# Patient Record
Sex: Female | Born: 1960 | Race: Black or African American | Hispanic: No | Marital: Single | Smoking: Never smoker
Health system: Southern US, Community
[De-identification: ages and names within clinical notes are randomized; demographics above are authoritative.]

## PROBLEM LIST (undated history)

## (undated) DIAGNOSIS — L0390 Cellulitis, unspecified: Secondary | ICD-10-CM

## (undated) DIAGNOSIS — M797 Fibromyalgia: Secondary | ICD-10-CM

## (undated) DIAGNOSIS — E119 Type 2 diabetes mellitus without complications: Secondary | ICD-10-CM

## (undated) DIAGNOSIS — Z86718 Personal history of other venous thrombosis and embolism: Secondary | ICD-10-CM

## (undated) DIAGNOSIS — M069 Rheumatoid arthritis, unspecified: Secondary | ICD-10-CM

## (undated) DIAGNOSIS — I5030 Unspecified diastolic (congestive) heart failure: Secondary | ICD-10-CM

## (undated) DIAGNOSIS — I10 Essential (primary) hypertension: Secondary | ICD-10-CM

---

## 1986-06-17 HISTORY — PX: ORIF ANKLE FRACTURE: SUR919

## 2003-01-20 ENCOUNTER — Emergency Department (HOSPITAL_COMMUNITY): Admission: EM | Admit: 2003-01-20 | Discharge: 2003-01-20 | Payer: Self-pay | Admitting: Emergency Medicine

## 2007-02-12 ENCOUNTER — Encounter: Admission: RE | Admit: 2007-02-12 | Discharge: 2007-02-12 | Payer: Self-pay | Admitting: Rheumatology

## 2007-09-23 ENCOUNTER — Emergency Department (HOSPITAL_COMMUNITY): Admission: EM | Admit: 2007-09-23 | Discharge: 2007-09-23 | Payer: Self-pay | Admitting: Emergency Medicine

## 2007-09-25 ENCOUNTER — Emergency Department (HOSPITAL_COMMUNITY): Admission: EM | Admit: 2007-09-25 | Discharge: 2007-09-25 | Payer: Self-pay | Admitting: Emergency Medicine

## 2007-09-28 ENCOUNTER — Emergency Department (HOSPITAL_COMMUNITY): Admission: EM | Admit: 2007-09-28 | Discharge: 2007-09-28 | Payer: Self-pay | Admitting: Emergency Medicine

## 2007-11-18 ENCOUNTER — Ambulatory Visit: Payer: Self-pay | Admitting: Vascular Surgery

## 2007-12-02 ENCOUNTER — Ambulatory Visit: Payer: Self-pay | Admitting: Vascular Surgery

## 2008-03-04 ENCOUNTER — Ambulatory Visit: Payer: Self-pay | Admitting: Vascular Surgery

## 2009-02-28 ENCOUNTER — Emergency Department (HOSPITAL_BASED_OUTPATIENT_CLINIC_OR_DEPARTMENT_OTHER): Admission: EM | Admit: 2009-02-28 | Discharge: 2009-02-28 | Payer: Self-pay | Admitting: Emergency Medicine

## 2009-02-28 ENCOUNTER — Ambulatory Visit: Payer: Self-pay | Admitting: Diagnostic Radiology

## 2009-07-04 ENCOUNTER — Observation Stay (HOSPITAL_COMMUNITY): Admission: EM | Admit: 2009-07-04 | Discharge: 2009-07-10 | Payer: Self-pay | Admitting: Emergency Medicine

## 2009-07-05 ENCOUNTER — Encounter (INDEPENDENT_AMBULATORY_CARE_PROVIDER_SITE_OTHER): Payer: Self-pay | Admitting: Emergency Medicine

## 2009-07-05 ENCOUNTER — Ambulatory Visit: Payer: Self-pay | Admitting: Vascular Surgery

## 2009-07-05 ENCOUNTER — Encounter (INDEPENDENT_AMBULATORY_CARE_PROVIDER_SITE_OTHER): Payer: Self-pay | Admitting: Internal Medicine

## 2010-07-08 ENCOUNTER — Encounter: Payer: Self-pay | Admitting: Orthopedic Surgery

## 2010-09-03 LAB — GLUCOSE, CAPILLARY
Glucose-Capillary: 107 mg/dL — ABNORMAL HIGH (ref 70–99)
Glucose-Capillary: 115 mg/dL — ABNORMAL HIGH (ref 70–99)
Glucose-Capillary: 135 mg/dL — ABNORMAL HIGH (ref 70–99)
Glucose-Capillary: 136 mg/dL — ABNORMAL HIGH (ref 70–99)
Glucose-Capillary: 139 mg/dL — ABNORMAL HIGH (ref 70–99)
Glucose-Capillary: 146 mg/dL — ABNORMAL HIGH (ref 70–99)
Glucose-Capillary: 154 mg/dL — ABNORMAL HIGH (ref 70–99)
Glucose-Capillary: 154 mg/dL — ABNORMAL HIGH (ref 70–99)
Glucose-Capillary: 156 mg/dL — ABNORMAL HIGH (ref 70–99)
Glucose-Capillary: 162 mg/dL — ABNORMAL HIGH (ref 70–99)
Glucose-Capillary: 177 mg/dL — ABNORMAL HIGH (ref 70–99)
Glucose-Capillary: 87 mg/dL (ref 70–99)

## 2010-09-03 LAB — CBC
HCT: 34.1 % — ABNORMAL LOW (ref 36.0–46.0)
HCT: 41 % (ref 36.0–46.0)
Hemoglobin: 11.4 g/dL — ABNORMAL LOW (ref 12.0–15.0)
Hemoglobin: 13.3 g/dL (ref 12.0–15.0)
MCHC: 32.4 g/dL (ref 30.0–36.0)
MCHC: 33.7 g/dL (ref 30.0–36.0)
MCHC: 34 g/dL (ref 30.0–36.0)
MCHC: 34.2 g/dL (ref 30.0–36.0)
MCV: 97.3 fL (ref 78.0–100.0)
MCV: 97.3 fL (ref 78.0–100.0)
MCV: 97.3 fL (ref 78.0–100.0)
Platelets: 215 10*3/uL (ref 150–400)
RBC: 3.4 MIL/uL — ABNORMAL LOW (ref 3.87–5.11)
RBC: 3.48 MIL/uL — ABNORMAL LOW (ref 3.87–5.11)
RBC: 3.51 MIL/uL — ABNORMAL LOW (ref 3.87–5.11)
RDW: 17.9 % — ABNORMAL HIGH (ref 11.5–15.5)
RDW: 18.2 % — ABNORMAL HIGH (ref 11.5–15.5)
RDW: 18.3 % — ABNORMAL HIGH (ref 11.5–15.5)
WBC: 5.3 10*3/uL (ref 4.0–10.5)
WBC: 7 10*3/uL (ref 4.0–10.5)

## 2010-09-03 LAB — BASIC METABOLIC PANEL
BUN: 8 mg/dL (ref 6–23)
CO2: 27 mEq/L (ref 19–32)
CO2: 27 mEq/L (ref 19–32)
CO2: 28 mEq/L (ref 19–32)
CO2: 28 mEq/L (ref 19–32)
Calcium: 8 mg/dL — ABNORMAL LOW (ref 8.4–10.5)
Calcium: 8.1 mg/dL — ABNORMAL LOW (ref 8.4–10.5)
Calcium: 8.1 mg/dL — ABNORMAL LOW (ref 8.4–10.5)
Chloride: 103 mEq/L (ref 96–112)
Chloride: 104 mEq/L (ref 96–112)
Chloride: 98 mEq/L (ref 96–112)
Creatinine, Ser: 0.5 mg/dL (ref 0.4–1.2)
Creatinine, Ser: 0.63 mg/dL (ref 0.4–1.2)
Creatinine, Ser: 0.7 mg/dL (ref 0.4–1.2)
GFR calc Af Amer: >60 mL/min (ref >60)
GFR calc Af Amer: >60 mL/min (ref >60)
GFR calc Af Amer: >60 mL/min (ref >60)
GFR calc Af Amer: >60 mL/min (ref >60)
GFR calc non Af Amer: >60 mL/min (ref >60)
Glucose, Bld: 117 mg/dL — ABNORMAL HIGH (ref 70–99)
Glucose, Bld: 144 mg/dL — ABNORMAL HIGH (ref 70–99)
Glucose, Bld: 179 mg/dL — ABNORMAL HIGH (ref 70–99)
Potassium: 3.2 mEq/L — ABNORMAL LOW (ref 3.5–5.1)
Potassium: 3.3 mEq/L — ABNORMAL LOW (ref 3.5–5.1)
Sodium: 137 mEq/L (ref 135–145)
Sodium: 140 mEq/L (ref 135–145)
Sodium: 140 mEq/L (ref 135–145)

## 2010-09-03 LAB — PROTIME-INR
INR: 1.93 — ABNORMAL HIGH (ref 0.00–1.49)
INR: 2.61 — ABNORMAL HIGH (ref 0.00–1.49)
Prothrombin Time: 21.9 seconds — ABNORMAL HIGH (ref 11.6–15.2)
Prothrombin Time: 26.9 seconds — ABNORMAL HIGH (ref 11.6–15.2)
Prothrombin Time: 27.7 seconds — ABNORMAL HIGH (ref 11.6–15.2)

## 2010-09-03 LAB — BETA-2-GLYCOPROTEIN I ABS, IGG/M/A
Beta-2-Glycoprotein I IgA: <3 U/mL (ref <=15)
Beta-2-Glycoprotein I IgM: <3 U/mL (ref <=15)

## 2010-09-03 LAB — CULTURE, BLOOD (ROUTINE X 2): Culture: NO GROWTH

## 2010-09-03 LAB — LIPID PANEL
Cholesterol: 149 mg/dL (ref 0–200)
LDL Cholesterol: 88 mg/dL (ref 0–99)
Total CHOL/HDL Ratio: 3 RATIO
Triglycerides: 57 mg/dL (ref <150)

## 2010-09-03 LAB — LUPUS ANTICOAGULANT PANEL
Lupus Anticoagulant: DETECTED — AB
PTT Lupus Anticoagulant: 50.1 secs — ABNORMAL HIGH (ref 32.0–43.4)

## 2010-09-03 LAB — PROTEIN C ACTIVITY: Protein C Activity: 125 % (ref 75–133)

## 2010-09-03 LAB — DIFFERENTIAL
Basophils Relative: 1 % (ref 0–1)
Eosinophils Relative: 0 % (ref 0–5)
Monocytes Absolute: 0.5 10*3/uL (ref 0.1–1.0)
Monocytes Relative: 5 % (ref 3–12)
Neutro Abs: 7.2 10*3/uL (ref 1.7–7.7)

## 2010-09-03 LAB — PROTEIN C, TOTAL: Protein C, Total: 123 % (ref 70–140)

## 2010-09-03 LAB — CARDIOLIPIN ANTIBODIES, IGG, IGM, IGA: Anticardiolipin IgM: <3 MPL U/mL — ABNORMAL LOW (ref <10)

## 2010-09-03 LAB — PROTEIN S ACTIVITY: Protein S Activity: 92 % (ref 69–129)

## 2010-09-03 LAB — HEMOGLOBIN A1C
Hgb A1c MFr Bld: 8.9 % — ABNORMAL HIGH (ref 4.6–6.1)
Mean Plasma Glucose: 209 mg/dL

## 2010-09-03 LAB — HOMOCYSTEINE: Homocysteine: 16.5 umol/L — ABNORMAL HIGH (ref 4.0–15.4)

## 2010-10-30 NOTE — Assessment & Plan Note (Signed)
OFFICE VISIT   Brittany Tran, Brittany Tran  DOB:  12-Jul-1960                                       03/04/2008  [id number]#:[id number]   Patient presents today for followup of her venous difficulty.  She was  initially seen by me on 11/18/07.  Thus, she underwent a formal duplex  evaluation on 06/17.  She did have evidence of venous reflux and had an  incidental finding of deep venous thrombosis in her left common femoral  vein and left popliteal vein.  She has been on three months of Coumadin  therapy.   She underwent repeat venous duplex today, and this portion shows  resolution of the left common femoral vein thrombus.  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   I discussed this with the patient.  Feel comfortable stopping her  Coumadin therapy since she has been on this for over three months now.  Regarding the leg swelling, I explained that she does have evidence of  deep venous difficulty but no evidence of correctable superficial venous  pathology.  I explained that her only mode of treatment will be  elevation and compression.  She has not been able to tolerate  compression garments due to her obesity.  She will attempt to elevate  her legs when possible.  She will see [place] again on an as-needed basis.   Larina Earthly, M.D.  Electronically Signed   TFE/MEDQ  D:  03/04/2008  T:  03/07/2008  Job:  [id number]   cc:   Gretta Arab. Valentina Lucks, M.D.

## 2010-10-30 NOTE — Procedures (Signed)
DUPLEX DEEP VENOUS EXAM - LOWER EXTREMITY   INDICATION:  Follow-up evaluation of left leg DVT.   HISTORY:  Edema:  Bilateral.  Trauma/Surgery:  No.  Pain:  No.  PE:  Previous DVT:  Left common femoral vein and left popliteal vein thrombus  seen by duplex performed 12/02/07.  Anticoagulants:  Patient is on Coumadin.  Other:   DUPLEX EXAM:                CFV   SFV   PopV  PTV    GSV                R  L  R  L  R  L  R   L  R  L  Thrombosis    o  o     o     P      o     o  Spontaneous   +  +     +     +      +     +  Phasic        +  +     +     +      +     +  Augmentation  +  +     +     +      +     +  Compressible  +  +     +     P      +     +  Competent     +  +     +     +      +     +   Legend:  + - yes  o - no  p - partial  D - decreased   IMPRESSION:  1. Previously documented left common femoral vein thrombus appears to      have resolved.  2. 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  3. Left greater saphenous vein is competent.  4. 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    _____________________________  Larina Earthly, M.D.   MC/MEDQ  D:  03/04/2008  T:  03/05/2008  Job:  [id number]

## 2010-10-30 NOTE — Procedures (Signed)
DUPLEX DEEP VENOUS EXAM - LOWER EXTREMITY   INDICATION:  Two week followup for venous reflux.   HISTORY:  Edema:  Bilateral.  Trauma/Surgery:  No.  Pain:  No.  PE:  No.  Previous DVT:  No.  Anticoagulants:  No.  Other:   DUPLEX EXAM:                CFV   SFV   PopV   PTV   GSV                R  L  R  L  R   L  R  L  R  L  Thrombosis    o  +  O  o  o   +        o  o  Spontaneous   +  D  +  D  +   D        +  D  Phasic        +  D  +  D  +   D        +  D  Augmentation  +  D  +  D  +   D        +  +  Compressible  +  P  +  +  +   P        +  +  Competent     0  +                     +  +   Legend:  + - yes  o - no  p - partial  D - decreased   IMPRESSION:  1. Partially occlusive mobile thrombus noted in the left common      femoral vein with partially occlusive thrombus also noted in the      left popliteal vein.  2. No evidence of deep venous thrombosis noted in the right lower      extremity with mild reflux noted at the common femoral vein level.  3. No evidence of reflux noted in the bilateral greater saphenous      veins with areas of varicosities noted at the bilateral calf and      distal thigh levels.  4. Unable to adequately visualize the bilateral tibial and lesser      saphenous veins.  5. Limited visualization of the bilateral lower extremities due to      patient body habitus and leg edema.    _____________________________  Larina Earthly, M.D.   CH/MEDQ  D:  12/02/2007  T:  12/02/2007  Job:  [id number]

## 2010-10-30 NOTE — Letter (Signed)
November 18, 2007   Gretta Arab. Valentina Lucks, M.D.  [address] [place]  [place] [address]   Re:  Brittany Tran, Brittany Tran                 DOB:  February 25, 1961   Dear Consuella Lose:   Thank you for asking me to see this patient for evaluation of her severe  bilateral venous hypertension.  As you know, she has had a long history  of venous hypertension and increasing difficulty with lower extremity  swelling and pain.  She has also had two different occasions of  significant bleed from the varicosities around the level of her right  ankle.  One of these was treated with a suture in the emergency  department.  She has been wearing the graduated compression stockings  that you prescribed with Jobst stockings in mid April.  She does report  that she has had some slight improvement with this but does continue to  have achy sensations with prolonged standing.   PAST MEDICAL HISTORY:  Significant for hypertension, rheumatoid  arthritis.  She has no major cardiovascular disease.   PHYSICAL EXAMINATION:  She is a morbidly obese black female appearing  her stated age of 28.  Her blood pressure is 151/100, heart rate is 93,  respirations 18.  She does have 2+ dorsalis pedis pulses bilaterally.  She does have a prior medial and lateral incisions on her ankle from  prior ankle surgery from a fracture.  She does have multiple small  punctate areas of venous varicosities just under the skin over her right  ankle with appearance of high risk for bleeding.  She does have erythema  and changes of venous hypertension in both calves.   She underwent a screening duplex by me and this did show reflux in her  saphenous veins bilaterally.  I explained that the patient is undergoing  a current appropriate initial treatment for this with graduated  compression stockings, elevation and ibuprofen for pain management.  I  plan to see her again at her convenience in the next 1-2 weeks for  evaluation with formal duplex to  determine competency of her deep  system.  She understands that she may be a candidate for laser ablation  to reduce her risk for ongoing bleeding from her right ankle.  We will  give you for further followup once we have seen her back with formal  duplex evaluation.   Larina Earthly, M.D.  Electronically Signed   TFE/MEDQ  D:  11/18/2007  T:  11/19/2007  Job:  [id number]

## 2010-10-30 NOTE — Assessment & Plan Note (Signed)
OFFICE VISIT   Brittany Tran, Brittany Tran  DOB:  01-14-1961                                       12/02/2007  [id number]#:[id number]   The patient presents today for formal venous duplex after initial visit  with her 2 weeks ago.  She is a very pleasant, morbidly obese black  female with severe venous hypertension bilaterally.  She has marked  amount of varicosities under her subcutaneous tissue on both legs and  with screening study did appear to have some reflux.  With a formal  duplex study today she does not have any evidence of saphenous vein  reflux.  Does have some reflux in the tributary varicosities around  these.  She did have an incidental finding of deep venous thrombosis at  the level of her left common femoral vein with some mobile thrombus and  also a lesser degree of thrombus in her popliteal vein.  I discussed  this with Dr. Maurice Small.  I feel that she does have indication for  Coumadin therapy.  I suspect this was at least subacute or chronic but  with some mobile thrombus present I would recommend 3-6 months of  Coumadin.  I discussed this at length with the patient as well.  I do  not feel that she has any indication for Lovenox bridge since she is  asymptomatic from this.  She is started on 10 mg of Coumadin daily and  will have a Coumadin check at Dr. Jone Baseman office on Friday, June 19.  I will see her again in 3 months with repeat duplex at that time.   Larina Earthly, M.D.  Electronically Signed   TFE/MEDQ  D:  12/02/2007  T:  12/03/2007  Job:  [id number]   cc:   Gretta Arab. Valentina Lucks, M.D.

## 2014-06-30 ENCOUNTER — Other Ambulatory Visit: Payer: Self-pay | Admitting: Rheumatology

## 2014-06-30 DIAGNOSIS — M25511 Pain in right shoulder: Secondary | ICD-10-CM

## 2014-07-10 ENCOUNTER — Other Ambulatory Visit: Payer: Self-pay

## 2014-07-20 ENCOUNTER — Ambulatory Visit
Admission: RE | Admit: 2014-07-20 | Discharge: 2014-07-20 | Disposition: A | Discharge status: Home / Self Care | Payer: 59 | Source: Ambulatory Visit | Attending: Rheumatology | Admitting: Rheumatology

## 2014-07-20 DIAGNOSIS — M25511 Pain in right shoulder: Secondary | ICD-10-CM

## 2014-07-31 ENCOUNTER — Ambulatory Visit: Admission: RE | Admit: 2014-07-31 | Payer: 59 | Source: Ambulatory Visit

## 2014-09-22 ENCOUNTER — Encounter (HOSPITAL_BASED_OUTPATIENT_CLINIC_OR_DEPARTMENT_OTHER): Payer: Self-pay | Admitting: *Deleted

## 2014-09-22 NOTE — Progress Notes (Signed)
   09/22/14 1225  OBSTRUCTIVE SLEEP APNEA  Have you ever been diagnosed with sleep apnea through a sleep study? No  Do you snore loudly (loud enough to be heard through closed doors)?  0  Do you often feel tired, fatigued, or sleepy during the daytime? 0  Has anyone observed you stop breathing during your sleep? 0  Do you have, or are you being treated for high blood pressure? 1  BMI more than 35 kg/m2? 1  Age over 55 years old? 1  Neck circumference greater than 40 cm/16 inches? 1  Gender: 0

## 2014-09-22 NOTE — Progress Notes (Signed)
To come in 09/28/14 for pt bmet-ekg-see anesthesia-bmi 48 Denies any snoring or sleeep apnea, no cardiac,resp problems

## 2014-09-28 ENCOUNTER — Encounter (HOSPITAL_BASED_OUTPATIENT_CLINIC_OR_DEPARTMENT_OTHER)
Admission: RE | Admit: 2014-09-28 | Discharge: 2014-09-28 | Disposition: A | Discharge status: Home / Self Care | Payer: 59 | Source: Ambulatory Visit | Attending: Orthopedic Surgery | Admitting: Orthopedic Surgery

## 2014-09-28 DIAGNOSIS — I10 Essential (primary) hypertension: Secondary | ICD-10-CM | POA: Diagnosis not present

## 2014-09-28 DIAGNOSIS — Z86718 Personal history of other venous thrombosis and embolism: Secondary | ICD-10-CM | POA: Diagnosis not present

## 2014-09-28 DIAGNOSIS — M25572 Pain in left ankle and joints of left foot: Secondary | ICD-10-CM | POA: Diagnosis not present

## 2014-09-28 DIAGNOSIS — M069 Rheumatoid arthritis, unspecified: Secondary | ICD-10-CM | POA: Diagnosis not present

## 2014-09-28 DIAGNOSIS — Y838 Other surgical procedures as the cause of abnormal reaction of the patient, or of later complication, without mention of misadventure at the time of the procedure: Secondary | ICD-10-CM | POA: Diagnosis not present

## 2014-09-28 DIAGNOSIS — Z7901 Long term (current) use of anticoagulants: Secondary | ICD-10-CM | POA: Diagnosis not present

## 2014-09-28 DIAGNOSIS — E119 Type 2 diabetes mellitus without complications: Secondary | ICD-10-CM | POA: Diagnosis not present

## 2014-09-28 DIAGNOSIS — Z880 Allergy status to penicillin: Secondary | ICD-10-CM | POA: Diagnosis not present

## 2014-09-28 DIAGNOSIS — T8484XA Pain due to internal orthopedic prosthetic devices, implants and grafts, initial encounter: Secondary | ICD-10-CM | POA: Diagnosis present

## 2014-09-28 DIAGNOSIS — M329 Systemic lupus erythematosus, unspecified: Secondary | ICD-10-CM | POA: Diagnosis not present

## 2014-09-28 LAB — BASIC METABOLIC PANEL
Anion gap: 8 (ref 5–15)
BUN: 9 mg/dL (ref 6–23)
CALCIUM: 9 mg/dL (ref 8.4–10.5)
CO2: 31 mmol/L (ref 19–32)
CREATININE: 0.54 mg/dL (ref 0.50–1.10)
Chloride: 103 mmol/L (ref 96–112)
GFR calc Af Amer: >90 mL/min (ref >90)
GFR calc non Af Amer: >90 mL/min (ref >90)
GLUCOSE: 114 mg/dL — AB (ref 70–99)
Potassium: 3.8 mmol/L (ref 3.5–5.1)
Sodium: 142 mmol/L (ref 135–145)

## 2014-09-28 LAB — APTT: aPTT: 36 seconds (ref 24–37)

## 2014-09-28 LAB — PROTIME-INR
INR: 1.64 — AB (ref 0.00–1.49)
Prothrombin Time: 19.6 seconds — ABNORMAL HIGH (ref 11.6–15.2)

## 2014-09-28 NOTE — Progress Notes (Signed)
Abnormal PT and INR reported to Dr Doran Durand and Dr Deatra Canter both advised surgery may continue as scheduled.  Anesthesia consult by Dr Deatra Canter.  Pt cleared for surgery at center in am

## 2014-09-29 ENCOUNTER — Ambulatory Visit (HOSPITAL_BASED_OUTPATIENT_CLINIC_OR_DEPARTMENT_OTHER)
Admission: RE | Admit: 2014-09-29 | Discharge: 2014-09-29 | Disposition: A | Discharge status: Home / Self Care | Payer: 59 | Source: Ambulatory Visit | Attending: Orthopedic Surgery | Admitting: Orthopedic Surgery

## 2014-09-29 ENCOUNTER — Ambulatory Visit (HOSPITAL_BASED_OUTPATIENT_CLINIC_OR_DEPARTMENT_OTHER): Payer: 59 | Admitting: Certified Registered"

## 2014-09-29 ENCOUNTER — Encounter (HOSPITAL_BASED_OUTPATIENT_CLINIC_OR_DEPARTMENT_OTHER)
Admission: RE | Disposition: A | Discharge status: Home / Self Care | Payer: Self-pay | Source: Ambulatory Visit | Attending: Orthopedic Surgery

## 2014-09-29 ENCOUNTER — Encounter (HOSPITAL_BASED_OUTPATIENT_CLINIC_OR_DEPARTMENT_OTHER): Payer: Self-pay | Admitting: *Deleted

## 2014-09-29 DIAGNOSIS — T8484XD Pain due to internal orthopedic prosthetic devices, implants and grafts, subsequent encounter: Secondary | ICD-10-CM

## 2014-09-29 DIAGNOSIS — M069 Rheumatoid arthritis, unspecified: Secondary | ICD-10-CM | POA: Insufficient documentation

## 2014-09-29 DIAGNOSIS — M329 Systemic lupus erythematosus, unspecified: Secondary | ICD-10-CM | POA: Insufficient documentation

## 2014-09-29 DIAGNOSIS — E119 Type 2 diabetes mellitus without complications: Secondary | ICD-10-CM | POA: Insufficient documentation

## 2014-09-29 DIAGNOSIS — M25572 Pain in left ankle and joints of left foot: Secondary | ICD-10-CM | POA: Insufficient documentation

## 2014-09-29 DIAGNOSIS — T8484XA Pain due to internal orthopedic prosthetic devices, implants and grafts, initial encounter: Secondary | ICD-10-CM | POA: Diagnosis not present

## 2014-09-29 DIAGNOSIS — Y838 Other surgical procedures as the cause of abnormal reaction of the patient, or of later complication, without mention of misadventure at the time of the procedure: Secondary | ICD-10-CM | POA: Insufficient documentation

## 2014-09-29 DIAGNOSIS — Z7901 Long term (current) use of anticoagulants: Secondary | ICD-10-CM | POA: Insufficient documentation

## 2014-09-29 DIAGNOSIS — Z86718 Personal history of other venous thrombosis and embolism: Secondary | ICD-10-CM | POA: Insufficient documentation

## 2014-09-29 DIAGNOSIS — I10 Essential (primary) hypertension: Secondary | ICD-10-CM | POA: Insufficient documentation

## 2014-09-29 DIAGNOSIS — Z880 Allergy status to penicillin: Secondary | ICD-10-CM | POA: Insufficient documentation

## 2014-09-29 HISTORY — DX: Rheumatoid arthritis, unspecified: M06.9

## 2014-09-29 HISTORY — DX: Personal history of other venous thrombosis and embolism: Z86.718

## 2014-09-29 HISTORY — DX: Type 2 diabetes mellitus without complications: E11.9

## 2014-09-29 HISTORY — PX: HARDWARE REMOVAL: SHX979

## 2014-09-29 HISTORY — DX: Essential (primary) hypertension: I10

## 2014-09-29 LAB — GLUCOSE, CAPILLARY
Glucose-Capillary: 113 mg/dL — ABNORMAL HIGH (ref 70–99)
Glucose-Capillary: 161 mg/dL — ABNORMAL HIGH (ref 70–99)

## 2014-09-29 LAB — POCT HEMOGLOBIN-HEMACUE: HEMOGLOBIN: 12.8 g/dL (ref 12.0–15.0)

## 2014-09-29 SURGERY — REMOVAL, HARDWARE
Anesthesia: General | Site: Ankle | Laterality: Left

## 2014-09-29 MED ORDER — ONDANSETRON HCL 4 MG/2ML IJ SOLN
INTRAMUSCULAR | Status: DC | PRN
  Administered 2014-09-29: 4 mg via INTRAVENOUS

## 2014-09-29 MED ORDER — LACTATED RINGERS IV SOLN
INTRAVENOUS | Status: DC
  Administered 2014-09-29 (×2): via INTRAVENOUS

## 2014-09-29 MED ORDER — PROPOFOL 10 MG/ML IV BOLUS
INTRAVENOUS | Status: DC | PRN
  Administered 2014-09-29 (×2): 20 mg via INTRAVENOUS
  Administered 2014-09-29: 200 mg via INTRAVENOUS

## 2014-09-29 MED ORDER — OXYCODONE HCL 5 MG/5ML PO SOLN: 5.0000 mg | Freq: Once | ORAL | Status: DC | PRN

## 2014-09-29 MED ORDER — DEXAMETHASONE SODIUM PHOSPHATE 10 MG/ML IJ SOLN
INTRAMUSCULAR | Status: DC | PRN
  Administered 2014-09-29: 4 mg via INTRAVENOUS

## 2014-09-29 MED ORDER — FENTANYL CITRATE 0.05 MG/ML IJ SOLN: 50.0000 ug | INTRAMUSCULAR | Status: DC | PRN

## 2014-09-29 MED ORDER — FENTANYL CITRATE 0.05 MG/ML IJ SOLN
INTRAMUSCULAR | Status: AC
  Filled 2014-09-29: qty 6

## 2014-09-29 MED ORDER — MIDAZOLAM HCL 2 MG/2ML IJ SOLN
INTRAMUSCULAR | Status: AC
  Filled 2014-09-29: qty 2

## 2014-09-29 MED ORDER — BACITRACIN ZINC 500 UNIT/GM EX OINT
TOPICAL_OINTMENT | CUTANEOUS | Status: DC | PRN
  Administered 2014-09-29: 1 via TOPICAL

## 2014-09-29 MED ORDER — PROMETHAZINE HCL 25 MG/ML IJ SOLN: 6.2500 mg | INTRAMUSCULAR | Status: DC | PRN

## 2014-09-29 MED ORDER — FENTANYL CITRATE 0.05 MG/ML IJ SOLN
INTRAMUSCULAR | Status: DC | PRN
  Administered 2014-09-29 (×2): 25 ug via INTRAVENOUS
  Administered 2014-09-29: 100 ug via INTRAVENOUS

## 2014-09-29 MED ORDER — DEXTROSE 5 % IV SOLN
3.0000 g | INTRAVENOUS | Status: AC
  Administered 2014-09-29: 3 g via INTRAVENOUS

## 2014-09-29 MED ORDER — OXYCODONE HCL 5 MG PO TABS: 5.0000 mg | ORAL_TABLET | Freq: Once | ORAL | Status: DC | PRN

## 2014-09-29 MED ORDER — BUPIVACAINE-EPINEPHRINE 0.5% -1:200000 IJ SOLN
INTRAMUSCULAR | Status: DC | PRN
  Administered 2014-09-29: 17 mL

## 2014-09-29 MED ORDER — MIDAZOLAM HCL 5 MG/5ML IJ SOLN
INTRAMUSCULAR | Status: DC | PRN
  Administered 2014-09-29: 2 mg via INTRAVENOUS

## 2014-09-29 MED ORDER — CEFAZOLIN SODIUM-DEXTROSE 2-3 GM-% IV SOLR
INTRAVENOUS | Status: AC
  Filled 2014-09-29: qty 50

## 2014-09-29 MED ORDER — 0.9 % SODIUM CHLORIDE (POUR BTL) OPTIME
TOPICAL | Status: DC | PRN
  Administered 2014-09-29: 200 mL

## 2014-09-29 MED ORDER — CHLORHEXIDINE GLUCONATE 4 % EX LIQD: 60.0000 mL | Freq: Once | CUTANEOUS | Status: DC

## 2014-09-29 MED ORDER — HYDROMORPHONE HCL 1 MG/ML IJ SOLN: 0.2500 mg | INTRAMUSCULAR | Status: DC | PRN

## 2014-09-29 MED ORDER — CEFAZOLIN SODIUM 1-5 GM-% IV SOLN
INTRAVENOUS | Status: AC
  Filled 2014-09-29: qty 50

## 2014-09-29 MED ORDER — MIDAZOLAM HCL 2 MG/2ML IJ SOLN: 1.0000 mg | INTRAMUSCULAR | Status: DC | PRN

## 2014-09-29 MED ORDER — BACITRACIN ZINC 500 UNIT/GM EX OINT
TOPICAL_OINTMENT | CUTANEOUS | Status: AC
  Filled 2014-09-29: qty 28.35

## 2014-09-29 MED ORDER — SODIUM CHLORIDE 0.9 % IV SOLN: INTRAVENOUS | Status: DC

## 2014-09-29 MED ORDER — BUPIVACAINE-EPINEPHRINE (PF) 0.5% -1:200000 IJ SOLN
INTRAMUSCULAR | Status: AC
  Filled 2014-09-29: qty 30

## 2014-09-29 MED ORDER — LIDOCAINE HCL (CARDIAC) 20 MG/ML IV SOLN
INTRAVENOUS | Status: DC | PRN
  Administered 2014-09-29: 30 mg via INTRAVENOUS

## 2014-09-29 SURGICAL SUPPLY — 65 items
BAG DECANTER FOR FLEXI CONT (MISCELLANEOUS) IMPLANT
BANDAGE ELASTIC 4 VELCRO ST LF (GAUZE/BANDAGES/DRESSINGS) IMPLANT
BANDAGE ESMARK 6X9 LF (GAUZE/BANDAGES/DRESSINGS) IMPLANT
BLADE SURG 15 STRL LF DISP TIS (BLADE) ×2 IMPLANT
BLADE SURG 15 STRL SS (BLADE) ×4
BNDG COHESIVE 4X5 TAN STRL (GAUZE/BANDAGES/DRESSINGS) ×3 IMPLANT
BNDG COHESIVE 6X5 TAN STRL LF (GAUZE/BANDAGES/DRESSINGS) IMPLANT
BNDG ESMARK 4X9 LF (GAUZE/BANDAGES/DRESSINGS) IMPLANT
BNDG ESMARK 6X9 LF (GAUZE/BANDAGES/DRESSINGS)
CHLORAPREP W/TINT 26ML (MISCELLANEOUS) ×3 IMPLANT
COVER BACK TABLE 60X90IN (DRAPES) ×3 IMPLANT
CUFF TOURNIQUET SINGLE 24IN (TOURNIQUET CUFF) ×3 IMPLANT
CUFF TOURNIQUET SINGLE 34IN LL (TOURNIQUET CUFF) IMPLANT
DECANTER SPIKE VIAL GLASS SM (MISCELLANEOUS) IMPLANT
DRAPE EXTREMITY T 121X128X90 (DRAPE) ×3 IMPLANT
DRAPE OEC MINIVIEW 54X84 (DRAPES) ×3 IMPLANT
DRAPE SURG 17X23 STRL (DRAPES) IMPLANT
DRAPE U-SHAPE 47X51 STRL (DRAPES) ×3 IMPLANT
DRSG EMULSION OIL 3X3 NADH (GAUZE/BANDAGES/DRESSINGS) ×3 IMPLANT
DRSG MEPILEX BORDER 4X8 (GAUZE/BANDAGES/DRESSINGS) ×6 IMPLANT
DRSG PAD ABDOMINAL 8X10 ST (GAUZE/BANDAGES/DRESSINGS) IMPLANT
ELECT REM PT RETURN 9FT ADLT (ELECTROSURGICAL) ×3
ELECTRODE REM PT RTRN 9FT ADLT (ELECTROSURGICAL) ×1 IMPLANT
GAUZE SPONGE 4X4 12PLY STRL (GAUZE/BANDAGES/DRESSINGS) ×3 IMPLANT
GLOVE BIO SURGEON STRL SZ8 (GLOVE) ×3 IMPLANT
GLOVE BIOGEL PI IND STRL 7.0 (GLOVE) ×1 IMPLANT
GLOVE BIOGEL PI IND STRL 8 (GLOVE) ×1 IMPLANT
GLOVE BIOGEL PI INDICATOR 7.0 (GLOVE) ×2
GLOVE BIOGEL PI INDICATOR 8 (GLOVE) ×2
GLOVE ECLIPSE 6.5 STRL STRAW (GLOVE) ×3 IMPLANT
GLOVE EXAM NITRILE MD LF STRL (GLOVE) ×3 IMPLANT
GOWN STRL REUS W/ TWL LRG LVL3 (GOWN DISPOSABLE) ×1 IMPLANT
GOWN STRL REUS W/ TWL XL LVL3 (GOWN DISPOSABLE) ×1 IMPLANT
GOWN STRL REUS W/TWL LRG LVL3 (GOWN DISPOSABLE) ×2
GOWN STRL REUS W/TWL XL LVL3 (GOWN DISPOSABLE) ×2
NEEDLE HYPO 22GX1.5 SAFETY (NEEDLE) ×3 IMPLANT
PACK BASIN DAY SURGERY FS (CUSTOM PROCEDURE TRAY) ×3 IMPLANT
PAD CAST 4YDX4 CTTN HI CHSV (CAST SUPPLIES) ×1 IMPLANT
PADDING CAST ABS 4INX4YD NS (CAST SUPPLIES)
PADDING CAST ABS COTTON 4X4 ST (CAST SUPPLIES) IMPLANT
PADDING CAST COTTON 4X4 STRL (CAST SUPPLIES) ×2
PADDING CAST COTTON 6X4 STRL (CAST SUPPLIES) IMPLANT
PENCIL BUTTON HOLSTER BLD 10FT (ELECTRODE) ×3 IMPLANT
SANITIZER HAND PURELL 535ML FO (MISCELLANEOUS) ×3 IMPLANT
SHEET MEDIUM DRAPE 40X70 STRL (DRAPES) ×3 IMPLANT
SLEEVE SCD COMPRESS KNEE MED (MISCELLANEOUS) ×3 IMPLANT
SPLINT FAST PLASTER 5X30 (CAST SUPPLIES)
SPLINT PLASTER CAST FAST 5X30 (CAST SUPPLIES) IMPLANT
SPONGE LAP 18X18 X RAY DECT (DISPOSABLE) ×3 IMPLANT
STOCKINETTE 6  STRL (DRAPES) ×2
STOCKINETTE 6 STRL (DRAPES) ×1 IMPLANT
SUCTION FRAZIER TIP 10 FR DISP (SUCTIONS) IMPLANT
SUT ETHILON 2 0 FS 18 (SUTURE) ×3 IMPLANT
SUT ETHILON 3 0 PS 1 (SUTURE) ×6 IMPLANT
SUT MNCRL AB 3-0 PS2 18 (SUTURE) ×3 IMPLANT
SUT VIC AB 0 SH 27 (SUTURE) IMPLANT
SUT VIC AB 2-0 SH 27 (SUTURE)
SUT VIC AB 2-0 SH 27XBRD (SUTURE) IMPLANT
SUT VICRYL 4-0 PS2 18IN ABS (SUTURE) IMPLANT
SYR BULB 3OZ (MISCELLANEOUS) ×3 IMPLANT
SYR CONTROL 10ML LL (SYRINGE) ×3 IMPLANT
TOWEL OR 17X24 6PK STRL BLUE (TOWEL DISPOSABLE) ×3 IMPLANT
TUBE CONNECTING 20'X1/4 (TUBING)
TUBE CONNECTING 20X1/4 (TUBING) IMPLANT
UNDERPAD 30X30 INCONTINENT (UNDERPADS AND DIAPERS) ×3 IMPLANT

## 2014-09-29 NOTE — Anesthesia Procedure Notes (Signed)
Procedure Name: Intubation Date/Time: 09/29/2014 8:40 AM Performed by: Jarome Trull D Pre-anesthesia Checklist: Patient identified, Emergency Drugs available, Suction available and Patient being monitored Patient Re-evaluated:Patient Re-evaluated prior to inductionOxygen Delivery Method: Circle System Utilized Preoxygenation: Pre-oxygenation with 100% oxygen Intubation Type: IV induction Ventilation: Mask ventilation without difficulty Laryngoscope Size: Mac and 3 Grade View: Grade II Tube type: Oral Tube size: 7.0 mm Number of attempts: 1 Airway Equipment and Method: Stylet and Oral airway Placement Confirmation: ETT inserted through vocal cords under direct vision,  positive ETCO2 and breath sounds checked- equal and bilateral Secured at: 21 cm Tube secured with: Tape Dental Injury: Teeth and Oropharynx as per pre-operative assessment

## 2014-09-29 NOTE — H&P (Addendum)
Brittany Tran is an 54 y.o. female.   Chief Complaint: left ankle pain HPI: 54 y/o female with left ankle painful hardware after ORIF of a left ankle bimal fracture.  She presents now for removal of the deep implants from the medial and lateral ankle.  Past Medical History  Diagnosis Date  . Hypertension   . Diabetes mellitus without complication   . History of DVT (deep vein thrombosis)   . Rheumatoid arthritis   . Lupus     Past Surgical History  Procedure Laterality Date  . Orif ankle fracture  1988    left    History reviewed. No pertinent family history. Social History:  reports that she has never smoked. She does not have any smokeless tobacco history on file. She reports that she does not drink alcohol or use illicit drugs.  Allergies:  Allergies  Allergen Reactions  . Penicillins Hives    Medications Prior to Admission  Medication Sig Dispense Refill  . acetaminophen (TYLENOL) 500 MG tablet Take 500 mg by mouth every 6 (six) hours as needed.    . clonazePAM (KLONOPIN) 0.5 MG tablet Take 0.5 mg by mouth at bedtime.    . folic acid (FOLVITE) 1 MG tablet Take 1 mg by mouth daily.    Marland Kitchen glimepiride (AMARYL) 4 MG tablet Take 4 mg by mouth daily with breakfast.    . lisinopril (PRINIVIL,ZESTRIL) 20 MG tablet Take 20 mg by mouth daily.    . methotrexate (RHEUMATREX) 2.5 MG tablet Take 2.5 mg by mouth once a week. Take 10 tablets weeklyCaution:Chemotherapy. Protect from light.    . Multiple Minerals-Vitamins (CALCIUM & VIT D3 BONE HEALTH PO) Take by mouth.    . predniSONE (DELTASONE) 1 MG tablet Take 1 mg by mouth daily with breakfast. Takes 2 in am=14m daily    . traMADol (ULTRAM) 50 MG tablet Take by mouth every 6 (six) hours as needed.    . warfarin (COUMADIN) 5 MG tablet Take 5 mg by mouth daily.      Results for orders placed or performed during the hospital encounter of 09/29/14 (from the past 48 hour(s))  Basic metabolic panel     Status: Abnormal   Collection Time:  09/28/14 10:20 AM  Result Value Ref Range   Sodium 142 135 - 145 mmol/L   Potassium 3.8 3.5 - 5.1 mmol/L   Chloride 103 96 - 112 mmol/L   CO2 31 19 - 32 mmol/L   Glucose, Bld 114 (H) 70 - 99 mg/dL   BUN 9 6 - 23 mg/dL   Creatinine, Ser 0.54 0.50 - 1.10 mg/dL   Calcium 9.0 8.4 - 10.5 mg/dL   GFR calc non Af Amer >90 >90 mL/min   GFR calc Af Amer >90 >90 mL/min    Comment: (NOTE) The eGFR has been calculated using the CKD EPI equation. This calculation has not been validated in all clinical situations. eGFR's persistently <90 mL/min signify possible Chronic Kidney Disease.    Anion gap 8 5 - 15  Protime-INR     Status: Abnormal   Collection Time: 09/28/14 10:20 AM  Result Value Ref Range   Prothrombin Time 19.6 (H) 11.6 - 15.2 seconds   INR 1.64 (H) 0.00 - 1.49  PTT     Status: None   Collection Time: 09/28/14 10:20 AM  Result Value Ref Range   aPTT 36 24 - 37 seconds   No results found.  ROS  No recent f/c/n/v/wt loss  Height 5' 4.75" (  1.645 m), weight 135.172 kg (298 lb). Physical Exam  Morbidly obese female in nad.  A and O x 4.  Mood and affect normal.  EOMi.  resp unlabored.  L ankle with healed surgical incisions medially and laterally.  At the proximal end of of the lateral incision there is an area of superficial skin loss that is 2.5 cm x 1 cm.  No metal is exposed.  No signs of infection.  Skin o/w thin and dark with venous stasis changes.  Sens to LT intact about the ankle.  No lymphadenopathy.  5/5 strength in PF and DF of the ankle and toes.  Brisk cap refill at the toes.  Assessment/Plan L ankle painful hardware - to OR for removal of deep implants from medial and lateral malleoli.    I spoke with Dr. Migdalia Dk about the open wound at the operative site.  She will plan to see the patient post op if there are any wound healing issues.  I've discussed with the patient the need to keep the leg elevated for as much as several weeks post op to optimize the chances of  healing this wound primarily and decreasing the risk of wound breakdown.  The risks and benefits of the alternative treatment options have been discussed in detail.  The patient wishes to proceed with surgery and specifically understands risks of bleeding, infection, nerve damage, blood clots, need for additional surgery, amputation and death.   Wylene Simmer Oct 25, 2014, 7:21 AM

## 2014-09-29 NOTE — Anesthesia Postprocedure Evaluation (Signed)
Anesthesia Post Note  Patient: Brittany Tran  Procedure(s) Performed: Procedure(s) (LRB): REMOVAL OF DEEP IMPLANT OF LEFT FIBULA  AND TIBIA (SEPARATE INCISIONS) (Left)  Anesthesia type: general  Patient location: PACU  Post pain: Pain level controlled  Post assessment: Patient's Cardiovascular Status Stable  Last Vitals:  Filed Vitals:   09/29/14 1030  BP: 133/76  Pulse: 84  Temp:   Resp: 16    Post vital signs: Reviewed and stable  Level of consciousness: sedated  Complications: No apparent anesthesia complications

## 2014-09-29 NOTE — Anesthesia Preprocedure Evaluation (Addendum)
Anesthesia Evaluation  Patient identified by MRN, date of birth, ID band Patient awake    Reviewed: Allergy & Precautions, NPO status , Patient's Chart, lab work & pertinent test results  History of Anesthesia Complications Negative for: history of anesthetic complications  Airway Mallampati: II  TM Distance: >3 FB Neck ROM: Full    Dental  (+) Dental Advisory Given   Pulmonary neg pulmonary ROS,    Pulmonary exam normal       Cardiovascular hypertension, Pt. on medications     Neuro/Psych negative neurological ROS  negative psych ROS   GI/Hepatic negative GI ROS, Neg liver ROS,   Endo/Other  diabetesMorbid obesity  Renal/GU negative Renal ROS     Musculoskeletal  (+) Arthritis -,   Abdominal   Peds  Hematology   Anesthesia Other Findings   Reproductive/Obstetrics                            Anesthesia Physical Anesthesia Plan  ASA: II  Anesthesia Plan: General   Post-op Pain Management:    Induction: Intravenous  Airway Management Planned: LMA and Oral ETT  Additional Equipment:   Intra-op Plan:   Post-operative Plan: Extubation in OR  Informed Consent: I have reviewed the patients History and Physical, chart, labs and discussed the procedure including the risks, benefits and alternatives for the proposed anesthesia with the patient or authorized representative who has indicated his/her understanding and acceptance.   Dental advisory given  Plan Discussed with: CRNA, Anesthesiologist and Surgeon  Anesthesia Plan Comments:        Anesthesia Quick Evaluation

## 2014-09-29 NOTE — Brief Op Note (Signed)
09/29/2014  9:52 AM  PATIENT:  [name]  54 y.o. female  PRE-OPERATIVE DIAGNOSIS:  painful hardware of left fibula and tibia, left ankle wound dehiscence  POST-OPERATIVE DIAGNOSIS:  same  Procedure(s): 1.  Removal of deep implants from the medial malleolus 2.  Removal of deep implants from the lateral malleolus (separate incision) 3.  Irrigation and excisional debridement of left lateral ankle wound 4.  AP and lateral xrays of the left ankle  SURGEON:  Wylene Simmer, MD  ASSISTANT: n/a  ANESTHESIA:   General  EBL:  minimal   TOURNIQUET:   Total Tourniquet Time Documented: Calf (Left) - 16 minutes Calf (Left) - 30 minutes Total: Calf (Left) - 46 minutes  COMPLICATIONS:  None apparent  DISPOSITION:  Extubated, awake and stable to recovery.  DICTATION ID:  [id number]

## 2014-09-29 NOTE — Progress Notes (Signed)
Patient has open wound over hardware site in left ankle. Old blood present. Patient states wound has been open since December but Dr Doran Durand not aware. Called OR to let Dr Doran Durand aware that pt has open wound over operative site.

## 2014-09-29 NOTE — Discharge Instructions (Addendum)
Wylene Simmer, MD [place]  Please read the following information regarding your care after surgery.  Medications  You only need a prescription for the narcotic pain medicine (ex. oxycodone, Percocet, Norco).  All of the other medicines listed below are available over the counter. X acetominophen (Tylenol) 650 mg every 4-6 hours as you need for minor pain X Percocet as prescribed for moderate to severe pain   X To help prevent blood clots, resume taking your warfarin (Coumadin).  You should also get up every hour while you are awake to move around.    Weight Bearing X Bear weight when you are able on your operated leg or foot. ? Bear weight only on the heel of your operated foot in the post-op shoe. ? Do not bear any weight on the operated leg or foot.  Cast / Splint / Dressing X Keep your dressing clean and dry.  Dont put anything (coat hanger, pencil, etc) down inside of it.  If it gets damp, use a hair dryer on the cool setting to dry it.  If it gets soaked, call the office to schedule an appointment for a dressing change.  After your dressing, cast or splint is removed; you may shower, but do not soak or scrub the wound.  Allow the water to run over it, and then gently pat it dry.  Swelling It is normal for you to have swelling where you had surgery.  To reduce swelling and pain, keep your toes above your nose for at least 3 days after surgery.  It may be necessary to keep your foot or leg elevated for several weeks.  If it hurts, it should be elevated.  Follow Up Call my office at [phone] when you are discharged from the hospital or surgery center to schedule an appointment to be seen two weeks after surgery.  Call my office at [phone] if you develop a fever >101.5 F, nausea, vomiting, bleeding from the surgical site or severe pain.       Post Anesthesia Home Care Instructions  Activity: Get plenty of rest for the remainder of the day. A responsible  adult should stay with you for 24 hours following the procedure.  For the next 24 hours, DO NOT: -Drive a car -Paediatric nurse -Drink alcoholic beverages -Take any medication unless instructed by your physician -Make any legal decisions or sign important papers.  Meals: Start with liquid foods such as gelatin or soup. Progress to regular foods as tolerated. Avoid greasy, spicy, heavy foods. If nausea and/or vomiting occur, drink only clear liquids until the nausea and/or vomiting subsides. Call your physician if vomiting continues.  Special Instructions/Symptoms: Your throat may feel dry or sore from the anesthesia or the breathing tube placed in your throat during surgery. If this causes discomfort, gargle with warm salt water. The discomfort should disappear within 24 hours.  If you had a scopolamine patch placed behind your ear for the management of post- operative nausea and/or vomiting:  1. The medication in the patch is effective for 72 hours, after which it should be removed.  Wrap patch in a tissue and discard in the trash. Wash hands thoroughly with soap and water. 2. You may remove the patch earlier than 72 hours if you experience unpleasant side effects which may include dry mouth, dizziness or visual disturbances. 3. Avoid touching the patch. Wash your hands with soap and water after contact with the patch.

## 2014-09-29 NOTE — Op Note (Signed)
NAME:  Brittany Tran, Brittany Tran NO.:  [id number]  MEDICAL RECORD NO.:  [id number]  LOCATION:                                 FACILITY:  PHYSICIAN:  Wylene Simmer, MD             DATE OF BIRTH:  DATE OF PROCEDURE:  09/29/2014 DATE OF DISCHARGE:                              OPERATIVE REPORT   PREOPERATIVE DIAGNOSES: 1. Painful hardware of the left medial and lateral malleoli. 2. Left ankle wound dehiscence.  POSTOPERATIVE DIAGNOSES: 1. Painful hardware of the left medial and lateral malleoli. 2. Left ankle wound dehiscence.  PROCEDURE: 1. Removal of deep implants from the left medial malleolus. 2. Removal of deep implants from the left lateral malleolus through a     separate incision. 3. Irrigation and excisional debridement of left lateral ankle wound. 4. AP and lateral radiographs of the left ankle.  SURGEON:  Wylene Simmer, MD  ANESTHESIA:  General.  ESTIMATED BLOOD LOSS:  Minimal.  TOURNIQUET TIME:  Forty six minutes.  COMPLICATIONS:  None apparent.  DISPOSITION:  Extubated, awake and stable to recovery.  INDICATIONS FOR PROCEDURE:  The patient is a 54 year old woman with a remote history of left ankle fracture treated operatively at an outside hospital.  She complains of ankle pain related to hardware and desires hardware removal.  When I saw her back in the winter, she had no wound on the ankle, but reports that she struck her ankle against her desk at work back in January.  Since that time, she has had a superficial wound at the proximal end of the incision that has not healed.  She presents today for debridement of the wound and removal of her hardware.  She understands the risks and benefits, the alternative treatment options, and elects surgical treatment.  She specifically understands risks of bleeding, infection, nerve damage, blood clots, need for additional surgery, continued pain, amputation, and death.  PROCEDURE IN DETAIL:  After  preoperative consent was obtained and the correct operative site was identified, the patient was brought to the operating room and placed supine on the operating table.  General anesthesia was induced.  Preoperative antibiotics were administered. Surgical time-out was taken.  The left lower extremity was prepped and draped in standard sterile fashion and tourniquet around the calf.  The extremity was exsanguinated and tourniquet was inflated to 200 mmHg. The patient's medial incision was identified.  The distal portion of the incision was opened again sharply.  Blunt dissection was carried down to the level of the medial malleolus.  The screw head was identified and was cleaned of all soft tissue.  The screw was removed in its entirety. Wound was irrigated and closed with Monocryl and nylon.  Attention was then turned to the lateral aspect of the ankle where the wound was identified.  It was measured 3 cm long x 2 cm wide and approximately 3 mm deep.  The edges of the wound were sharply excised with a scalpel.  The fibrinous tissue at the depth of the wound was also sharply excised.  The depth of the wound was then incised down to the level of the fibula and  the distal portion of the incision was extended. The wound was extended proximally as well.  Blunt dissection was carried down subperiosteally, both anterior and posterior on the fibula.  The plate was noted to be completely overgrown with bone.  Osteotomes and rongeurs were used to remove all of the overgrown bone.  All 4 screws were removed in their entirety followed by the 1/3rd tubular plate.  The wound was then irrigated copiously.  Excisional debridement was then performed proximally from the level of the skin all the way down to the level of the bone removing all devitalized tissue.  Wound was again irrigated and closed with a combination of 3-0 and 2-0 nylon horizontal mattress and retention sutures.  The incisions were then  anesthetized with 0.5% Marcaine with epinephrine, both medially and laterally for postoperative pain control.  Sterile dressings were applied and followed by a compression wrap.  The tourniquet was released after application of the dressings at 46 minutes.  Patient was awakened from anesthesia and transported to the recovery room in stable condition.  FOLLOWUP PLAN:  The patient will be weightbearing as tolerated on the left lower extremity.  She will keep it elevated as much as possible and follow up with me in 2 weeks for suture removal.  RADIOGRAPHS:  AP and lateral radiographs of the left ankle were obtained intraoperatively.  These show interval removal of all hardware from the medial and lateral malleoli.  No acute injuries noted.     Wylene Simmer, MD     JH/MEDQ  D:  09/29/2014  T:  09/29/2014  Job:  [id number]

## 2014-09-29 NOTE — Transfer of Care (Signed)
Immediate Anesthesia Transfer of Care Note  Patient: Brittany Tran  Procedure(s) Performed: Procedure(s): REMOVAL OF DEEP IMPLANT OF LEFT FIBULA  AND TIBIA (SEPARATE INCISIONS) (Left)  Patient Location: PACU  Anesthesia Type:General  Level of Consciousness: awake, alert , oriented and patient cooperative  Airway & Oxygen Therapy: Patient Spontanous Breathing and Patient connected to face mask oxygen  Post-op Assessment: Report given to RN and Post -op Vital signs reviewed and stable  Post vital signs: Reviewed and stable  Last Vitals:  Filed Vitals:   09/29/14 0731  BP: 139/63  Pulse: 86  Temp: 36.6 C  Resp: 20    Complications: No apparent anesthesia complications

## 2014-09-30 ENCOUNTER — Encounter (HOSPITAL_BASED_OUTPATIENT_CLINIC_OR_DEPARTMENT_OTHER): Payer: Self-pay | Admitting: Orthopedic Surgery

## 2017-03-12 ENCOUNTER — Emergency Department (HOSPITAL_BASED_OUTPATIENT_CLINIC_OR_DEPARTMENT_OTHER): Payer: 59

## 2017-03-12 ENCOUNTER — Inpatient Hospital Stay (HOSPITAL_BASED_OUTPATIENT_CLINIC_OR_DEPARTMENT_OTHER)
Admission: EM | Admit: 2017-03-12 | Discharge: 2017-03-15 | DRG: 872 | Disposition: A | Discharge status: Home / Self Care | Payer: 59 | Attending: Family Medicine | Admitting: Family Medicine

## 2017-03-12 ENCOUNTER — Encounter (HOSPITAL_BASED_OUTPATIENT_CLINIC_OR_DEPARTMENT_OTHER): Payer: Self-pay

## 2017-03-12 DIAGNOSIS — L0390 Cellulitis, unspecified: Secondary | ICD-10-CM

## 2017-03-12 DIAGNOSIS — M069 Rheumatoid arthritis, unspecified: Secondary | ICD-10-CM | POA: Diagnosis present

## 2017-03-12 DIAGNOSIS — R6510 Systemic inflammatory response syndrome (SIRS) of non-infectious origin without acute organ dysfunction: Secondary | ICD-10-CM | POA: Diagnosis present

## 2017-03-12 DIAGNOSIS — E119 Type 2 diabetes mellitus without complications: Secondary | ICD-10-CM | POA: Diagnosis present

## 2017-03-12 DIAGNOSIS — Z86718 Personal history of other venous thrombosis and embolism: Secondary | ICD-10-CM

## 2017-03-12 DIAGNOSIS — Z7984 Long term (current) use of oral hypoglycemic drugs: Secondary | ICD-10-CM

## 2017-03-12 DIAGNOSIS — I10 Essential (primary) hypertension: Secondary | ICD-10-CM | POA: Diagnosis present

## 2017-03-12 DIAGNOSIS — R509 Fever, unspecified: Secondary | ICD-10-CM | POA: Diagnosis present

## 2017-03-12 DIAGNOSIS — Z880 Allergy status to penicillin: Secondary | ICD-10-CM

## 2017-03-12 DIAGNOSIS — R000 Tachycardia, unspecified: Secondary | ICD-10-CM | POA: Diagnosis not present

## 2017-03-12 DIAGNOSIS — Z833 Family history of diabetes mellitus: Secondary | ICD-10-CM

## 2017-03-12 DIAGNOSIS — L03115 Cellulitis of right lower limb: Secondary | ICD-10-CM | POA: Diagnosis present

## 2017-03-12 DIAGNOSIS — Z7901 Long term (current) use of anticoagulants: Secondary | ICD-10-CM | POA: Diagnosis not present

## 2017-03-12 DIAGNOSIS — Z7952 Long term (current) use of systemic steroids: Secondary | ICD-10-CM | POA: Diagnosis not present

## 2017-03-12 DIAGNOSIS — Z23 Encounter for immunization: Secondary | ICD-10-CM

## 2017-03-12 DIAGNOSIS — A419 Sepsis, unspecified organism: Secondary | ICD-10-CM | POA: Diagnosis present

## 2017-03-12 DIAGNOSIS — E876 Hypokalemia: Secondary | ICD-10-CM | POA: Diagnosis present

## 2017-03-12 DIAGNOSIS — L03116 Cellulitis of left lower limb: Secondary | ICD-10-CM

## 2017-03-12 HISTORY — DX: Cellulitis, unspecified: L03.90

## 2017-03-12 LAB — CBC WITH DIFFERENTIAL/PLATELET
BASOS ABS: 0 10*3/uL (ref 0.0–0.1)
BASOS PCT: 0 %
Eosinophils Absolute: 0 10*3/uL (ref 0.0–0.7)
Eosinophils Relative: 0 %
HEMATOCRIT: 45.3 % (ref 36.0–46.0)
Hemoglobin: 14.7 g/dL (ref 12.0–15.0)
LYMPHS PCT: 7 %
Lymphs Abs: 0.9 10*3/uL (ref 0.7–4.0)
MCH: 30.6 pg (ref 26.0–34.0)
MCHC: 32.5 g/dL (ref 30.0–36.0)
MCV: 94.2 fL (ref 78.0–100.0)
Monocytes Absolute: 0.3 10*3/uL (ref 0.1–1.0)
Monocytes Relative: 2 %
NEUTROS ABS: 12.1 10*3/uL — AB (ref 1.7–7.7)
NEUTROS PCT: 91 %
Platelets: 242 10*3/uL (ref 150–400)
RBC: 4.81 MIL/uL (ref 3.87–5.11)
RDW: 14.4 % (ref 11.5–15.5)
WBC: 13.3 10*3/uL — AB (ref 4.0–10.5)

## 2017-03-12 LAB — COMPREHENSIVE METABOLIC PANEL
ALT: 19 U/L (ref 14–54)
AST: 35 U/L (ref 15–41)
Albumin: 3.8 g/dL (ref 3.5–5.0)
Alkaline Phosphatase: 128 U/L — ABNORMAL HIGH (ref 38–126)
Anion gap: 9 (ref 5–15)
BILIRUBIN TOTAL: 1 mg/dL (ref 0.3–1.2)
BUN: 12 mg/dL (ref 6–20)
CHLORIDE: 102 mmol/L (ref 101–111)
CO2: 27 mmol/L (ref 22–32)
Calcium: 8.9 mg/dL (ref 8.9–10.3)
Creatinine, Ser: 0.89 mg/dL (ref 0.44–1.00)
GFR calc Af Amer: >60 mL/min (ref >60)
Glucose, Bld: 187 mg/dL — ABNORMAL HIGH (ref 65–99)
POTASSIUM: 3.4 mmol/L — AB (ref 3.5–5.1)
Sodium: 138 mmol/L (ref 135–145)
Total Protein: 7.7 g/dL (ref 6.5–8.1)

## 2017-03-12 LAB — I-STAT CG4 LACTIC ACID, ED: LACTIC ACID, VENOUS: 3.48 mmol/L — AB (ref 0.5–1.9)

## 2017-03-12 LAB — PROTIME-INR
INR: 2.85
Prothrombin Time: 29.7 seconds — ABNORMAL HIGH (ref 11.4–15.2)

## 2017-03-12 MED ORDER — ONDANSETRON HCL 4 MG/2ML IJ SOLN
4.0000 mg | Freq: Once | INTRAMUSCULAR | Status: AC
  Administered 2017-03-12: 4 mg via INTRAVENOUS
  Filled 2017-03-12: qty 2

## 2017-03-12 MED ORDER — SODIUM CHLORIDE 0.9 % IV BOLUS (SEPSIS)
30.0000 mL/kg | Freq: Once | INTRAVENOUS | Status: AC
  Administered 2017-03-12: 4056 mL via INTRAVENOUS

## 2017-03-12 MED ORDER — VANCOMYCIN HCL IN DEXTROSE 1-5 GM/200ML-% IV SOLN
1000.0000 mg | Freq: Once | INTRAVENOUS | Status: AC
  Administered 2017-03-12: 1000 mg via INTRAVENOUS
  Filled 2017-03-12: qty 200

## 2017-03-12 MED ORDER — FENTANYL CITRATE (PF) 100 MCG/2ML IJ SOLN
100.0000 ug | Freq: Once | INTRAMUSCULAR | Status: AC
  Administered 2017-03-12: 100 ug via INTRAVENOUS
  Filled 2017-03-12: qty 2

## 2017-03-12 NOTE — ED Notes (Signed)
Patient transported to X-ray 

## 2017-03-12 NOTE — ED Notes (Signed)
Pt returned from X-ray.  

## 2017-03-12 NOTE — ED Notes (Signed)
ED Provider at bedside. 

## 2017-03-12 NOTE — ED Triage Notes (Signed)
C/o recurrent cellulitis to right LE-redness/swelling started today-NAD-able to stand to scale for weight slowly with own cane

## 2017-03-12 NOTE — ED Provider Notes (Signed)
[facility] Provider Note: Georgena Spurling, MD, FACEP  CSN: [id number] MRN: [id number] ARRIVAL: 03/12/17 at 2224 ROOM: [place]  Cellulitis   HISTORY OF PRESENT ILLNESS  03/12/17 10:58 PM Brittany Tran is a 56 y.o. female with history of diabetes. She is here with pain, swelling and erythema of the right lower leg which began earlier today. It is been associated with fever 203.2 as well as chills. The patient states symptoms are consistent with previous cellulitis in the left leg. She rates the pain in that leg as a 10 out of 10, worse with attempted weightbearing. She is only able to stand with the assistance of a cane. She was noted to be febrile and tachycardic on arrival and the sepsis protocol was initiated.    Past Medical History:  Diagnosis Date  . Cellulitis   . Diabetes mellitus without complication ([place])   . History of DVT (deep vein thrombosis)   . Hypertension   . Rheumatoid arthritis [facility])     Past Surgical History:  Procedure Laterality Date  . HARDWARE REMOVAL Left 09/29/2014   Procedure: REMOVAL OF DEEP IMPLANT OF LEFT FIBULA  AND TIBIA (SEPARATE INCISIONS);  Surgeon: Wylene Simmer, MD;  Location: [facility];  Service: Orthopedics;  Laterality: Left;  . ORIF ANKLE FRACTURE  1988   left    No family history on file.  Social History  Substance Use Topics  . Smoking status: Never Smoker  . Smokeless tobacco: Never Used  . Alcohol use No    Prior to Admission medications   Medication Sig Start Date End Date Taking? Authorizing Provider  acetaminophen (TYLENOL) 500 MG tablet Take 500 mg by mouth every 6 (six) hours as needed.    [provider]  clonazePAM (KLONOPIN) 0.5 MG tablet Take 0.5 mg by mouth at bedtime.    [provider]  folic acid (FOLVITE) 1 MG tablet Take 1 mg by mouth daily.    [provider]  glimepiride (AMARYL) 4 MG tablet Take 4 mg by mouth daily with breakfast.     [provider]  lisinopril (PRINIVIL,ZESTRIL) 20 MG tablet Take 20 mg by mouth daily.    [provider]  methotrexate (RHEUMATREX) 2.5 MG tablet Take 2.5 mg by mouth once a week. Take 10 tablets weeklyCaution:Chemotherapy. Protect from light.    [provider]  Multiple Minerals-Vitamins (CALCIUM & VIT D3 BONE HEALTH PO) Take by mouth.    [provider]  oxyCODONE-acetaminophen (PERCOCET) 10-325 MG per tablet Take 1 tablet by mouth every 4 (four) hours as needed for pain.    [provider]  predniSONE (DELTASONE) 1 MG tablet Take 1 mg by mouth daily with breakfast. Takes 2 in am=2mg  daily    [provider]  warfarin (COUMADIN) 5 MG tablet Take 5 mg by mouth daily.    [provider]    Allergies Penicillins   REVIEW OF SYSTEMS  Negative except as noted here or in the History of Present Illness.   PHYSICAL EXAMINATION  Initial Vital Signs Blood pressure 129/81, pulse (!) 123, temperature (!) 103.2 F (39.6 C), temperature source Oral, resp. rate (!) 24, height 5\' 5"  (1.651 m), weight 135.2 kg (298 lb), SpO2 95 %.  Examination General: Well-developed, obese female in no acute distress; appearance consistent with age of record HENT: normocephalic; atraumatic Eyes: pupils equal, round and reactive to light; extraocular muscles intact Neck: supple Heart: regular rate and rhythm; tachycardia Lungs:  clear to auscultation bilaterally Abdomen: soft; obese; nontender; bowel sounds present Extremities: No deformity; edema of lower legs, right greater than left; dorsalis pedis pulses +1 bilaterally; erythema and tenderness of right lower leg consistent with cellulitis:   Neurologic: Awake, alert and oriented; motor function intact in all extremities and symmetric; no facial droop Skin: Warm and dry Psychiatric: Normal mood and affect   RESULTS  Summary of this visit's results, reviewed by myself:   EKG  Interpretation  Date/Time:    Ventricular Rate:    PR Interval:    QRS Duration:   QT Interval:    QTC Calculation:   R Axis:     Text Interpretation:        Laboratory Studies: Results for orders placed or performed during the hospital encounter of 03/12/17 (from the past 24 hour(s))  Comprehensive metabolic panel     Status: Abnormal   Collection Time: 03/12/17 10:50 PM  Result Value Ref Range   Sodium 138 135 - 145 mmol/L   Potassium 3.4 (L) 3.5 - 5.1 mmol/L   Chloride 102 101 - 111 mmol/L   CO2 27 22 - 32 mmol/L   Glucose, Bld 187 (H) 65 - 99 mg/dL   BUN 12 6 - 20 mg/dL   Creatinine, Ser 0.89 0.44 - 1.00 mg/dL   Calcium 8.9 8.9 - 10.3 mg/dL   Total Protein 7.7 6.5 - 8.1 g/dL   Albumin 3.8 3.5 - 5.0 g/dL   AST 35 15 - 41 U/L   ALT 19 14 - 54 U/L   Alkaline Phosphatase 128 (H) 38 - 126 U/L   Total Bilirubin 1.0 0.3 - 1.2 mg/dL   GFR calc non Af Amer >60 >60 mL/min   GFR calc Af Amer >60 >60 mL/min   Anion gap 9 5 - 15  CBC with Differential     Status: Abnormal   Collection Time: 03/12/17 10:50 PM  Result Value Ref Range   WBC 13.3 (H) 4.0 - 10.5 K/uL   RBC 4.81 3.87 - 5.11 MIL/uL   Hemoglobin 14.7 12.0 - 15.0 g/dL   HCT 45.3 36.0 - 46.0 %   MCV 94.2 78.0 - 100.0 fL   MCH 30.6 26.0 - 34.0 pg   MCHC 32.5 30.0 - 36.0 g/dL   RDW 14.4 11.5 - 15.5 %   Platelets 242 150 - 400 K/uL   Neutrophils Relative % 91 %   Neutro Abs 12.1 (H) 1.7 - 7.7 K/uL   Lymphocytes Relative 7 %   Lymphs Abs 0.9 0.7 - 4.0 K/uL   Monocytes Relative 2 %   Monocytes Absolute 0.3 0.1 - 1.0 K/uL   Eosinophils Relative 0 %   Eosinophils Absolute 0.0 0.0 - 0.7 K/uL   Basophils Relative 0 %   Basophils Absolute 0.0 0.0 - 0.1 K/uL  Protime-INR     Status: Abnormal   Collection Time: 03/12/17 10:50 PM  Result Value Ref Range   Prothrombin Time 29.7 (H) 11.4 - 15.2 seconds   INR 2.85   I-Stat CG4 Lactic Acid, ED     Status: Abnormal   Collection Time: 03/12/17 10:58 PM  Result Value Ref  Range   Lactic Acid, Venous 3.48 (HH) 0.5 - 1.9 mmol/L   Comment NOTIFIED PHYSICIAN    Imaging Studies: Dg Chest 2 View  Result Date: 03/12/2017 CLINICAL DATA:  Fever, cellulitis of right lower extremity. EXAM: CHEST  2 VIEW COMPARISON:  07/08/2009 FINDINGS: Heart and mediastinal contours are normal in size. Low  lung volumes with crowding of interstitial lung markings. No pneumonic consolidation, effusion or pulmonary edema. No acute nor suspicious osseous abnormality. IMPRESSION: Low lung volumes with crowding of interstitial lung markings. No pneumonic consolidation is noted. Electronically Signed   By: Ashley Royalty M.D.   On: 03/12/2017 23:23    ED COURSE  Nursing notes and initial vitals signs, including pulse oximetry, reviewed.  Vitals:   03/12/17 2234  BP: 129/81  Pulse: (!) 123  Resp: (!) 24  Temp: (!) 103.2 F (39.6 C)  TempSrc: Oral  SpO2: 95%  Weight: 135.2 kg (298 lb)  Height: 5\' 5"  (1.651 m)   11:25 PM Fluid bolus initiated. Vancomycin one gram IV ordered.  11:59 PM Dr. Myna Hidalgo accepts for transfer to [facility].  PROCEDURES    ED DIAGNOSES     ICD-10-CM   1. SIRS (systemic inflammatory response syndrome) (HCC) R65.10   2. Cellulitis of right lower leg L03.115        Grason Brailsford, Jenny Reichmann, MD 03/12/17 2359

## 2017-03-13 ENCOUNTER — Other Ambulatory Visit (HOSPITAL_COMMUNITY): Payer: 59

## 2017-03-13 ENCOUNTER — Encounter (HOSPITAL_COMMUNITY): Payer: Self-pay | Admitting: Internal Medicine

## 2017-03-13 ENCOUNTER — Inpatient Hospital Stay (HOSPITAL_COMMUNITY): Payer: 59

## 2017-03-13 DIAGNOSIS — R000 Tachycardia, unspecified: Secondary | ICD-10-CM | POA: Diagnosis present

## 2017-03-13 DIAGNOSIS — L03116 Cellulitis of left lower limb: Secondary | ICD-10-CM

## 2017-03-13 DIAGNOSIS — A419 Sepsis, unspecified organism: Secondary | ICD-10-CM | POA: Diagnosis present

## 2017-03-13 DIAGNOSIS — L03115 Cellulitis of right lower limb: Secondary | ICD-10-CM

## 2017-03-13 DIAGNOSIS — R509 Fever, unspecified: Secondary | ICD-10-CM

## 2017-03-13 DIAGNOSIS — L0390 Cellulitis, unspecified: Secondary | ICD-10-CM

## 2017-03-13 DIAGNOSIS — E876 Hypokalemia: Secondary | ICD-10-CM | POA: Diagnosis present

## 2017-03-13 LAB — URINALYSIS, ROUTINE W REFLEX MICROSCOPIC
Bilirubin Urine: NEGATIVE
Glucose, UA: NEGATIVE mg/dL
KETONES UR: NEGATIVE mg/dL
Leukocytes, UA: NEGATIVE
Nitrite: NEGATIVE
PROTEIN: NEGATIVE mg/dL
Specific Gravity, Urine: 1.005 (ref 1.005–1.030)
pH: 7 (ref 5.0–8.0)

## 2017-03-13 LAB — TROPONIN I
Troponin I: <0.03 ng/mL (ref <0.03)
Troponin I: <0.03 ng/mL (ref <0.03)

## 2017-03-13 LAB — COMPREHENSIVE METABOLIC PANEL
ALBUMIN: 2.6 g/dL — AB (ref 3.5–5.0)
ALK PHOS: 92 U/L (ref 38–126)
ALT: 15 U/L (ref 14–54)
AST: 26 U/L (ref 15–41)
Anion gap: 10 (ref 5–15)
BILIRUBIN TOTAL: 0.8 mg/dL (ref 0.3–1.2)
BUN: 9 mg/dL (ref 6–20)
CALCIUM: 7.5 mg/dL — AB (ref 8.9–10.3)
CO2: 23 mmol/L (ref 22–32)
Chloride: 106 mmol/L (ref 101–111)
Creatinine, Ser: 0.72 mg/dL (ref 0.44–1.00)
GFR calc Af Amer: >60 mL/min (ref >60)
GFR calc non Af Amer: >60 mL/min (ref >60)
GLUCOSE: 165 mg/dL — AB (ref 65–99)
POTASSIUM: 3.1 mmol/L — AB (ref 3.5–5.1)
Sodium: 139 mmol/L (ref 135–145)
TOTAL PROTEIN: 5.5 g/dL — AB (ref 6.5–8.1)

## 2017-03-13 LAB — CBC
HEMATOCRIT: 39.4 % (ref 36.0–46.0)
Hemoglobin: 12.4 g/dL (ref 12.0–15.0)
MCH: 29.7 pg (ref 26.0–34.0)
MCHC: 31.5 g/dL (ref 30.0–36.0)
MCV: 94.5 fL (ref 78.0–100.0)
Platelets: 191 10*3/uL (ref 150–400)
RBC: 4.17 MIL/uL (ref 3.87–5.11)
RDW: 14.4 % (ref 11.5–15.5)
WBC: 17.9 10*3/uL — ABNORMAL HIGH (ref 4.0–10.5)

## 2017-03-13 LAB — GLUCOSE, CAPILLARY
GLUCOSE-CAPILLARY: 142 mg/dL — AB (ref 65–99)
GLUCOSE-CAPILLARY: 126 mg/dL — AB (ref 65–99)
Glucose-Capillary: 105 mg/dL — ABNORMAL HIGH (ref 65–99)
Glucose-Capillary: 154 mg/dL — ABNORMAL HIGH (ref 65–99)

## 2017-03-13 LAB — HEMOGLOBIN A1C
HEMOGLOBIN A1C: 7.5 % — AB (ref 4.8–5.6)
Mean Plasma Glucose: 168.55 mg/dL

## 2017-03-13 LAB — ECHOCARDIOGRAM COMPLETE
HEIGHTINCHES: 65 in
Weight: 4878.34 oz

## 2017-03-13 LAB — HIV ANTIBODY (ROUTINE TESTING W REFLEX): HIV SCREEN 4TH GENERATION: NONREACTIVE

## 2017-03-13 MED ORDER — VANCOMYCIN HCL IN DEXTROSE 1-5 GM/200ML-% IV SOLN
1000.0000 mg | Freq: Two times a day (BID) | INTRAVENOUS | Status: DC
  Administered 2017-03-13 – 2017-03-15 (×5): 1000 mg via INTRAVENOUS
  Filled 2017-03-13 (×6): qty 200

## 2017-03-13 MED ORDER — DIPHENHYDRAMINE HCL 25 MG PO CAPS: 25.0000 mg | ORAL_CAPSULE | Freq: Four times a day (QID) | ORAL | Status: DC | PRN

## 2017-03-13 MED ORDER — HYDROMORPHONE HCL 1 MG/ML IJ SOLN: 0.5000 mg | Freq: Four times a day (QID) | INTRAMUSCULAR | Status: DC | PRN

## 2017-03-13 MED ORDER — WARFARIN - PHARMACIST DOSING INPATIENT
Freq: Every day | Status: DC
  Administered 2017-03-15: 18:00:00

## 2017-03-13 MED ORDER — GLIMEPIRIDE 4 MG PO TABS
4.0000 mg | ORAL_TABLET | Freq: Every day | ORAL | Status: DC
  Filled 2017-03-13 (×3): qty 1

## 2017-03-13 MED ORDER — ACETAMINOPHEN 325 MG PO TABS
650.0000 mg | ORAL_TABLET | Freq: Four times a day (QID) | ORAL | Status: DC | PRN
  Administered 2017-03-13 (×2): 650 mg via ORAL
  Filled 2017-03-13 (×2): qty 2

## 2017-03-13 MED ORDER — WARFARIN SODIUM 5 MG PO TABS
5.0000 mg | ORAL_TABLET | Freq: Every day | ORAL | Status: DC
  Administered 2017-03-13: 5 mg via ORAL
  Filled 2017-03-13: qty 1

## 2017-03-13 MED ORDER — ONDANSETRON HCL 4 MG/2ML IJ SOLN: 4.0000 mg | Freq: Four times a day (QID) | INTRAMUSCULAR | Status: DC | PRN

## 2017-03-13 MED ORDER — POTASSIUM CHLORIDE CRYS ER 20 MEQ PO TBCR
20.0000 meq | EXTENDED_RELEASE_TABLET | Freq: Once | ORAL | Status: AC
  Administered 2017-03-13: 20 meq via ORAL
  Filled 2017-03-13: qty 1

## 2017-03-13 MED ORDER — PREDNISONE 1 MG PO TABS
2.0000 mg | ORAL_TABLET | Freq: Every day | ORAL | Status: DC
  Administered 2017-03-13 – 2017-03-15 (×3): 2 mg via ORAL
  Filled 2017-03-13 (×3): qty 2

## 2017-03-13 MED ORDER — INSULIN ASPART 100 UNIT/ML ~~LOC~~ SOLN: 0.0000 [IU] | Freq: Three times a day (TID) | SUBCUTANEOUS | Status: DC

## 2017-03-13 MED ORDER — FOLIC ACID 1 MG PO TABS
1.0000 mg | ORAL_TABLET | Freq: Every day | ORAL | Status: DC
  Administered 2017-03-13 – 2017-03-15 (×3): 1 mg via ORAL
  Filled 2017-03-13 (×3): qty 1

## 2017-03-13 MED ORDER — LISINOPRIL 20 MG PO TABS
20.0000 mg | ORAL_TABLET | Freq: Every day | ORAL | Status: DC
  Filled 2017-03-13 (×2): qty 1

## 2017-03-13 MED ORDER — OXYCODONE HCL 5 MG PO TABS: 5.0000 mg | ORAL_TABLET | ORAL | Status: DC | PRN

## 2017-03-13 MED ORDER — SODIUM CHLORIDE 0.9 % IV SOLN
INTRAVENOUS | Status: AC
  Administered 2017-03-13: 03:00:00 via INTRAVENOUS

## 2017-03-13 MED ORDER — OXYCODONE-ACETAMINOPHEN 10-325 MG PO TABS: 1.0000 | ORAL_TABLET | ORAL | Status: DC | PRN

## 2017-03-13 MED ORDER — TRAMADOL HCL 50 MG PO TABS
50.0000 mg | ORAL_TABLET | Freq: Four times a day (QID) | ORAL | Status: DC | PRN
  Administered 2017-03-13 – 2017-03-14 (×3): 50 mg via ORAL
  Filled 2017-03-13 (×4): qty 1

## 2017-03-13 MED ORDER — INSULIN ASPART 100 UNIT/ML ~~LOC~~ SOLN: 0.0000 [IU] | Freq: Every day | SUBCUTANEOUS | Status: DC

## 2017-03-13 MED ORDER — INFLUENZA VAC SPLIT QUAD 0.5 ML IM SUSY
0.5000 mL | PREFILLED_SYRINGE | INTRAMUSCULAR | Status: AC
  Administered 2017-03-14: 0.5 mL via INTRAMUSCULAR
  Filled 2017-03-13: qty 0.5

## 2017-03-13 MED ORDER — CLONAZEPAM 0.5 MG PO TABS
0.5000 mg | ORAL_TABLET | Freq: Every day | ORAL | Status: DC
  Administered 2017-03-13 – 2017-03-14 (×2): 0.5 mg via ORAL
  Filled 2017-03-13 (×2): qty 1

## 2017-03-13 MED ORDER — OXYCODONE-ACETAMINOPHEN 10-325 MG PO TABS
1.0000 | ORAL_TABLET | ORAL | Status: DC | PRN
Start: 2017-03-13 — End: 2017-03-13

## 2017-03-13 MED ORDER — LEVOFLOXACIN IN D5W 500 MG/100ML IV SOLN
500.0000 mg | INTRAVENOUS | Status: DC
  Administered 2017-03-13 – 2017-03-15 (×3): 500 mg via INTRAVENOUS
  Filled 2017-03-13 (×4): qty 100

## 2017-03-13 MED ORDER — OXYCODONE-ACETAMINOPHEN 5-325 MG PO TABS
1.0000 | ORAL_TABLET | ORAL | Status: DC | PRN
  Administered 2017-03-14 (×2): 1 via ORAL
  Filled 2017-03-13 (×2): qty 1

## 2017-03-13 MED ORDER — ACETAMINOPHEN 650 MG RE SUPP
650.0000 mg | Freq: Four times a day (QID) | RECTAL | Status: DC | PRN
Start: 2017-03-13 — End: 2017-03-15

## 2017-03-13 MED ORDER — VANCOMYCIN HCL IN DEXTROSE 1-5 GM/200ML-% IV SOLN
1000.0000 mg | Freq: Once | INTRAVENOUS | Status: AC
  Administered 2017-03-13: 1000 mg via INTRAVENOUS
  Filled 2017-03-13: qty 200

## 2017-03-13 NOTE — Progress Notes (Signed)
The patient was seen and admitted earlier this a.m. by my associate. Please refer to H&P for details regarding history, assessment, and plan.  Patient is currently getting antibiotics for cellulitis and sepsis. Blood cultures will be followed  Gen.: Patient in no acute distress, alert and awake Cardiovascular: No cyanosis Pulmonary: Equal chest rise, no increased work of breathing  We'll plan on reassessing next a.m. for now continue current antibiotic regimen  Thecla Forgione, [employer]

## 2017-03-13 NOTE — Progress Notes (Signed)
  Echocardiogram 2D Echocardiogram has been performed.  Tresa Res 03/13/2017, 11:35 AM

## 2017-03-13 NOTE — Progress Notes (Signed)
ANTICOAGULATION CONSULT NOTE - Initial Consult  Pharmacy Consult for Warfarin  Indication: History of DVT  Allergies  Allergen Reactions  . Penicillins Hives    Patient Measurements: Height: 5\' 5"  (165.1 cm) Weight: 298 lb (135.2 kg) IBW/kg (Calculated) : 57  Vital Signs: Temp: 98.8 F (37.1 C) (09/27 0201) Temp Source: Oral (09/27 0201) BP: 130/64 (09/27 0201) Pulse Rate: 121 (09/27 0201)  Labs:  Recent Labs  03/12/17 2250  HGB 14.7  HCT 45.3  PLT 242  LABPROT 29.7*  INR 2.85  CREATININE 0.89    Estimated Creatinine Clearance: 97.2 mL/min (by C-G formula based on SCr of 0.89 mg/dL).   Medical History: Past Medical History:  Diagnosis Date  . Cellulitis   . Diabetes mellitus without complication ([place])   . History of DVT (deep vein thrombosis)   . Hypertension   . Rheumatoid arthritis [facility])     Assessment: 56 y/o F with hx of DVT on warfarin PTA here with cellulitis. Continuing warfarin. INR is therapeutic at 2.85. CBC good.  Warfarin PTA dosing per patient: 5 mg daily   Goal of Therapy:  INR 2-3 Monitor platelets by anticoagulation protocol: Yes   Plan:  Warfarin 5 mg daily at 1800 Daily PT/INR Monitor for bleeding   Narda Bonds 03/13/2017,2:59 AM

## 2017-03-13 NOTE — Progress Notes (Signed)
Pharmacy Antibiotic Note  Brittany Tran is a 56 y.o. female admitted on 03/12/2017 with cellulitis.  Pharmacy has been consulted for Vancomycin dosing. Presents to the ED with RLE cellulitis. WBC mildly elevated. Renal function good.   Plan: Vancomycin 2000 mg IV x 1, then 1000 mg IV q12h Levaquin per MD Trend WBC, temp, renal function  F/U infectious work-up Drug levels as indicated   Height: 5\' 5"  (165.1 cm) Weight: 298 lb (135.2 kg) IBW/kg (Calculated) : 57  Temp (24hrs), Avg:101 F (38.3 C), Min:98.8 F (37.1 C), Max:103.2 F (39.6 C)   Recent Labs Lab 03/12/17 2250 03/12/17 2258  WBC 13.3*  --   CREATININE 0.89  --   LATICACIDVEN  --  3.48*    Estimated Creatinine Clearance: 97.2 mL/min (by C-G formula based on SCr of 0.89 mg/dL).    Allergies  Allergen Reactions  . Penicillins Hives    Narda Bonds 03/13/2017 2:52 AM

## 2017-03-13 NOTE — H&P (Addendum)
TR[facility] H&P   Patient Demographics:    Brittany Tran, is a 56 y.o. female  MRN: [id number]   DOB - June 14, 1961  Admit Date - 03/12/2017  Outpatient Primary MD for the patient is Patient, No Pcp Per  Referring MD/NP/PA:   Shanon Rosser  Outpatient Specialists:  Wylene Simmer (orthopedics) Berna Bue (Rheumatology)  Patient coming from: home  Chief Complaint  Patient presents with  . Cellulitis      HPI:    Brittany Tran  is a 56 y.o. female, w diabetes, hypertension, RA, DVT (recurrent)  apparently c/o right distal lower extremity redness almost up to the knee.  Starting today.  + fever.  + chills.  Pt presented to [place] ER for evaluation.   In ED,  Dx with cellulitis and sepsis.  Wbc 13.3,  And pt tachycardic.  Pt will be admitted for sepsis.     Review of systems:    In addition to the HPI above,  No Headache, No changes with Vision or hearing, No problems swallowing food or Liquids, No Chest pain, Cough or Shortness of Breath, No Abdominal pain, No Nausea or Vommitting, Bowel movements are regular, No Blood in stool or Urine, No dysuria, No new skin rashes or bruises, No new joints pains-aches,  No new weakness, tingling, numbness in any extremity, No recent weight gain or loss, No polyuria, polydypsia or polyphagia, No significant Mental Stressors.  A full 10 point Review of Systems was done, except as stated above, all other Review of Systems were negative.   With Past History of the following :    Past Medical History:  Diagnosis Date  . Cellulitis   . Diabetes mellitus without complication ([place])   . History of DVT (deep vein thrombosis)   . Hypertension   . Rheumatoid arthritis [facility])       Past Surgical History:  Procedure Laterality Date  . HARDWARE REMOVAL Left 09/29/2014   Procedure: REMOVAL OF DEEP IMPLANT OF LEFT FIBULA  AND TIBIA  (SEPARATE INCISIONS);  Surgeon: Wylene Simmer, MD;  Location: [place];  Service: Orthopedics;  Laterality: Left;  . ORIF ANKLE FRACTURE  1988   left      Social History:     Social History  Substance Use Topics  . Smoking status: Never Smoker  . Smokeless tobacco: Never Used  . Alcohol use No     Lives - at home  Mobility - walks by self   Family History :     Family History  Problem Relation Age of Onset  . Diabetes Mother       Home Medications:   Prior to Admission medications   Medication Sig Start Date End Date Taking? Authorizing Provider  acetaminophen (TYLENOL) 500 MG tablet Take 500 mg by mouth every 6 (six) hours as needed.    [provider]  clonazePAM (  KLONOPIN) 0.5 MG tablet Take 0.5 mg by mouth at bedtime.    [provider]  folic acid (FOLVITE) 1 MG tablet Take 1 mg by mouth daily.    [provider]  glimepiride (AMARYL) 4 MG tablet Take 4 mg by mouth daily with breakfast.    [provider]  lisinopril (PRINIVIL,ZESTRIL) 20 MG tablet Take 20 mg by mouth daily.    [provider]  methotrexate (RHEUMATREX) 2.5 MG tablet Take 2.5 mg by mouth once a week. Take 10 tablets weeklyCaution:Chemotherapy. Protect from light.    [provider]  Multiple Minerals-Vitamins (CALCIUM & VIT D3 BONE HEALTH PO) Take by mouth.    [provider]  oxyCODONE-acetaminophen (PERCOCET) 10-325 MG per tablet Take 1 tablet by mouth every 4 (four) hours as needed for pain.    [provider]  predniSONE (DELTASONE) 1 MG tablet Take 1 mg by mouth daily with breakfast. Takes 2 in am=2mg  daily    [provider]  warfarin (COUMADIN) 5 MG tablet Take 5 mg by mouth daily.    [provider]     Allergies:     Allergies  Allergen Reactions  . Penicillins Hives     Physical Exam:   Vitals  Blood pressure 130/64, pulse (!) 121, temperature 98.8 F (37.1 C), temperature  source Oral, resp. rate 18, height 5\' 5"  (1.651 m), weight 135.2 kg (298 lb), SpO2 95 %.   1. General  lying in bed in NAD,   Morbidly obese  2. Normal affect and insight, Not Suicidal or Homicidal, Awake Alert, Oriented X 3.  3. No F.N deficits, ALL C.Nerves Intact, Strength 5/5 all 4 extremities, Sensation intact all 4 extremities, Plantars down going.  4. Ears and Eyes appear Normal, Conjunctivae clear, PERRLA. Moist Oral Mucosa.  5. Supple Neck, No JVD, No cervical lymphadenopathy appriciated, No Carotid Bruits.  6. Symmetrical Chest wall movement, Good air movement bilaterally, CTAB.  7. RRR, No Gallops, Rubs or Murmurs, No Parasternal Heave.  8. Positive Bowel Sounds, Abdomen Soft, No tenderness, No organomegaly appriciated,No rebound -guarding or rigidity.  9.  No Cyanosis, Normal Skin Turgor, Redness of the right distal lower ext from the ankle to almost completely up to the knee  10. Good muscle tone,  joints appear normal , no effusions, Normal ROM.  11. No Palpable Lymph Nodes in Neck or Axillae     Data Review:    CBC  Recent Labs Lab 03/12/17 2250  WBC 13.3*  HGB 14.7  HCT 45.3  PLT 242  MCV 94.2  MCH 30.6  MCHC 32.5  RDW 14.4  LYMPHSABS 0.9  MONOABS 0.3  EOSABS 0.0  BASOSABS 0.0   ------------------------------------------------------------------------------------------------------------------  Chemistries   Recent Labs Lab 03/12/17 2250  NA 138  K 3.4*  CL 102  CO2 27  GLUCOSE 187*  BUN 12  CREATININE 0.89  CALCIUM 8.9  AST 35  ALT 19  ALKPHOS 128*  BILITOT 1.0   ------------------------------------------------------------------------------------------------------------------ estimated creatinine clearance is 97.2 mL/min (by C-G formula based on SCr of 0.89 mg/dL). ------------------------------------------------------------------------------------------------------------------ No results for input(s): TSH, T4TOTAL, T3FREE,  THYROIDAB in the last 72 hours.  Invalid input(s): FREET3  Coagulation profile  Recent Labs Lab 03/12/17 2250  INR 2.85   ------------------------------------------------------------------------------------------------------------------- No results for input(s): DDIMER in the last 72 hours. -------------------------------------------------------------------------------------------------------------------  Cardiac Enzymes No results for input(s): CKMB, TROPONINI, MYOGLOBIN in the last 168 hours.  Invalid input(s): CK ------------------------------------------------------------------------------------------------------------------ No results found for: BNP   ---------------------------------------------------------------------------------------------------------------  Urinalysis No results found for: COLORURINE, APPEARANCEUR, [place], [place], GLUCOSEU, HGBUR, BILIRUBINUR, KETONESUR, PROTEINUR, UROBILINOGEN, NITRITE, LEUKOCYTESUR  ----------------------------------------------------------------------------------------------------------------   Imaging Results:    Dg Chest 2 View  Result Date: 03/12/2017 CLINICAL DATA:  Fever, cellulitis of right lower extremity. EXAM: CHEST  2 VIEW COMPARISON:  07/08/2009 FINDINGS: Heart and mediastinal contours are normal in size. Low lung volumes with crowding of interstitial lung markings. No pneumonic consolidation, effusion or pulmonary edema. No acute nor suspicious osseous abnormality. IMPRESSION: Low lung volumes with crowding of interstitial lung markings. No pneumonic consolidation is noted. Electronically Signed   By: Ashley Royalty M.D.   On: 03/12/2017 23:23      Assessment & Plan:    Principal Problem:   Sepsis due to cellulitis [facility]) Active Problems:   Hypokalemia   Tachycardia   Fever, unspecified    Sepsis due to cellulitis Blood culture x2 pending Start vanco iv, pharmacy to dose, levaquin iv pharmacy to dose.    Tachycardia secondary to sepsis 12 lead ekg Tele Trop I q6h x3 Check echo  Fever secondary to sepsis Monitor  Hypokalemia Replete Repeat cmp in am  Leukocytosis Secondary to sepsis Repeat cbc in am  RA  STOP methotrexate Cont prednisone 2mg  po qday  DM2 fsbs ac and qhs, ISS Continue amaryl  DVT (recurrent) Coumadin pharmacy to dose  DVT Prophylaxis Coumadin  AM Labs Ordered, also please review Full Orders  Family Communication: Admission, patients condition and plan of care including tests being ordered have been discussed with the patient who indicate understanding and agree with the plan and Code Status.  Code Status FULL CODE  Likely DC to  home  Condition GUARDED    Consults called: none  Admission status: inpatient  Time spent in minutes : 45 minutes   Jani Gravel M.D on 03/13/2017 at 2:35 AM  Between 7am to 7pm - Pager - [phone]  After 7pm go to [email or web address] - password [email or web address]  [facility] - Office  [phone]

## 2017-03-13 NOTE — Progress Notes (Addendum)
Patient arrived to the unit via [facility] from [facility].  Patient is alert and oriented x 4.  Complaints of pain in the right lower extremity.  Paged admissions to notify the patient has arrived to the unit. Vital signs: Temp: 98.8 BP: 130/64; Pulse: 121; resp 18; and 95% on room air.  Skin assessment complete.  cellulitis to the right lower extremity.  IV intact to the right and left antecubital with normal saline infusing as bolus.  Placed the patient on cardiac monitoring.  Educated the patient on how to reach the staff on the unit.  Lowered the bed and placed the call light within reach.  Will continue to monitor the patient.

## 2017-03-14 LAB — CBC WITH DIFFERENTIAL/PLATELET
BASOS PCT: 0 %
Basophils Absolute: 0 10*3/uL (ref 0.0–0.1)
EOS ABS: 0.2 10*3/uL (ref 0.0–0.7)
Eosinophils Relative: 2 %
HCT: 33.3 % — ABNORMAL LOW (ref 36.0–46.0)
Hemoglobin: 10.8 g/dL — ABNORMAL LOW (ref 12.0–15.0)
LYMPHS ABS: 1.9 10*3/uL (ref 0.7–4.0)
Lymphocytes Relative: 16 %
MCH: 30.3 pg (ref 26.0–34.0)
MCHC: 32.4 g/dL (ref 30.0–36.0)
MCV: 93.5 fL (ref 78.0–100.0)
MONO ABS: 0.6 10*3/uL (ref 0.1–1.0)
MONOS PCT: 5 %
Neutro Abs: 9.3 10*3/uL — ABNORMAL HIGH (ref 1.7–7.7)
Neutrophils Relative %: 77 %
PLATELETS: 170 10*3/uL (ref 150–400)
RBC: 3.56 MIL/uL — ABNORMAL LOW (ref 3.87–5.11)
RDW: 14.9 % (ref 11.5–15.5)
WBC: 12 10*3/uL — ABNORMAL HIGH (ref 4.0–10.5)

## 2017-03-14 LAB — BASIC METABOLIC PANEL
Anion gap: 6 (ref 5–15)
BUN: 9 mg/dL (ref 6–20)
CO2: 26 mmol/L (ref 22–32)
Calcium: 7.7 mg/dL — ABNORMAL LOW (ref 8.9–10.3)
Chloride: 105 mmol/L (ref 101–111)
Creatinine, Ser: 0.65 mg/dL (ref 0.44–1.00)
GFR calc Af Amer: >60 mL/min (ref >60)
Glucose, Bld: 114 mg/dL — ABNORMAL HIGH (ref 65–99)
Potassium: 3.1 mmol/L — ABNORMAL LOW (ref 3.5–5.1)
Sodium: 137 mmol/L (ref 135–145)

## 2017-03-14 LAB — GLUCOSE, CAPILLARY
GLUCOSE-CAPILLARY: 95 mg/dL (ref 65–99)
Glucose-Capillary: 116 mg/dL — ABNORMAL HIGH (ref 65–99)
Glucose-Capillary: 108 mg/dL — ABNORMAL HIGH (ref 65–99)
Glucose-Capillary: 112 mg/dL — ABNORMAL HIGH (ref 65–99)

## 2017-03-14 LAB — PROTIME-INR
INR: 3.06
PROTHROMBIN TIME: 31.4 s — AB (ref 11.4–15.2)

## 2017-03-14 MED ORDER — WARFARIN 0.5 MG HALF TABLET
1.5000 mg | ORAL_TABLET | Freq: Once | ORAL | Status: AC
  Administered 2017-03-14: 17:00:00 1.5 mg via ORAL
  Filled 2017-03-14: qty 1

## 2017-03-14 MED ORDER — POTASSIUM CHLORIDE CRYS ER 20 MEQ PO TBCR
40.0000 meq | EXTENDED_RELEASE_TABLET | Freq: Once | ORAL | Status: AC
  Administered 2017-03-14: 40 meq via ORAL
  Filled 2017-03-14: qty 2

## 2017-03-14 NOTE — Progress Notes (Signed)
PROGRESS NOTE    Brittany Tran  DXI:[id number] DOB: Dec 16, 1960 DOA: 03/12/2017 PCP: Patient, No Pcp Per    Brief Narrative:  56 y.o. female, w diabetes, hypertension, RA, DVT (recurrent)  apparently c/o right distal lower extremity redness almost up to the knee.  Starting today.  + fever.  + chills.  Pt presented to [place] ER for evaluation  Assessment & Plan:   Principal Problem:   Sepsis due to cellulitis [facility]) - Patient still having breakthrough fevers as such will continue IV antibiotics. Will transition to oral antibiotics once patient is fever free for 24 hour period. Suspect most likely next a.m. - Will follow-up with cultures until then  Active Problems:   Hypokalemia - We'll replace and reassess next a.m. - kdur order placed.    Tachycardia -  Most likely secondary to infectious etiology    Fever, unspecified - Secondary to infectious etiology patient with cellulitis    Cellulitis of right lower leg - Most likely source of principal problem  DVT prophylaxis: Pt on warfarin Code Status: Full Family Communication: None at bedside Disposition Plan: Once patient is fever free for more than 24 hours a transition oral antibiotics and discharge   Consultants:   None   Procedures: None   Antimicrobials: Patient on Levaquin and vancomycin   Subjective: Patient has no new complaints. No acute issues overnight  Objective: Vitals:   03/13/17 2145 03/14/17 0500 03/14/17 0500 03/14/17 1321  BP: 125/87  132/69 140/75  Pulse: (!) 106  91 87  Resp: 20  17 20   Temp: (!) 100.4 F (38 C)  98.3 F (36.8 C) 98.2 F (36.8 C)  TempSrc: Oral  Oral Oral  SpO2: 95%  92% 100%  Weight:  (!) 138.9 kg (306 lb 4.8 oz)    Height:        Intake/Output Summary (Last 24 hours) at 03/14/17 1810 Last data filed at 03/14/17 1631  Gross per 24 hour  Intake              500 ml  Output             1200 ml  Net             -700 ml   Filed Weights   03/12/17  2234 03/13/17 0201 03/14/17 0500  Weight: 135.2 kg (298 lb) (!) 138.3 kg (304 lb 14.3 oz) (!) 138.9 kg (306 lb 4.8 oz)    Examination:  General exam: Appears calm and comfortable, in nad.  Respiratory system: Clear to auscultation. Respiratory effort normal. Cardiovascular system: S1 & S2 heard, RRR. No JVD, murmurs, rubs, gallops or clicks. No pedal edema. Gastrointestinal system: Abdomen is nondistended, soft and nontender. No organomegaly or masses felt. Normal bowel sounds heard. Central nervous system: Alert and oriented. No focal neurological deficits. Extremities: Symmetric 5 x 5 power. Skin: Cellulitis of right lower extremity, no induration or fluctuance, is more warm when compared to left lower extremity Psychiatry: Judgement and insight appear normal. Mood & affect appropriate.     Data Reviewed: I have personally reviewed following labs and imaging studies  CBC:  Recent Labs Lab 03/12/17 2250 03/13/17 0339 03/14/17 0417  WBC 13.3* 17.9* 12.0*  NEUTROABS 12.1*  --  9.3*  HGB 14.7 12.4 10.8*  HCT 45.3 39.4 33.3*  MCV 94.2 94.5 93.5  PLT 242 191 767   Basic Metabolic Panel:  Recent Labs Lab 03/12/17 2250 03/13/17 0339 03/14/17 0417  NA 138 139 137  K 3.4* 3.1* 3.1*  CL 102 106 105  CO2 27 23 26   GLUCOSE 187* 165* 114*  BUN 12 9 9   CREATININE 0.89 0.72 0.65  CALCIUM 8.9 7.5* 7.7*   GFR: Estimated Creatinine Clearance: 110 mL/min (by C-G formula based on SCr of 0.65 mg/dL). Liver Function Tests:  Recent Labs Lab 03/12/17 2250 03/13/17 0339  AST 35 26  ALT 19 15  ALKPHOS 128* 92  BILITOT 1.0 0.8  PROT 7.7 5.5*  ALBUMIN 3.8 2.6*   No results for input(s): LIPASE, AMYLASE in the last 168 hours. No results for input(s): AMMONIA in the last 168 hours. Coagulation Profile:  Recent Labs Lab 03/12/17 2250 03/14/17 0417  INR 2.85 3.06   Cardiac Enzymes:  Recent Labs Lab 03/13/17 0339 03/13/17 0801 03/13/17 1452  TROPONINI <0.03 <0.03  <0.03   BNP (last 3 results) No results for input(s): PROBNP in the last 8760 hours. HbA1C:  Recent Labs  03/13/17 0339  HGBA1C 7.5*   CBG:  Recent Labs Lab 03/13/17 1705 03/13/17 2140 03/14/17 0801 03/14/17 1148 03/14/17 1709  GLUCAP 105* 154* 95 116* 108*   Lipid Profile: No results for input(s): CHOL, HDL, LDLCALC, TRIG, CHOLHDL, LDLDIRECT in the last 72 hours. Thyroid Function Tests: No results for input(s): TSH, T4TOTAL, FREET4, T3FREE, THYROIDAB in the last 72 hours. Anemia Panel: No results for input(s): VITAMINB12, FOLATE, FERRITIN, TIBC, IRON, RETICCTPCT in the last 72 hours. Sepsis Labs:  Recent Labs Lab 03/12/17 2258  LATICACIDVEN 3.48*    Recent Results (from the past 240 hour(s))  Culture, blood (Routine x 2)     Status: None (Preliminary result)   Collection Time: 03/12/17 10:53 PM  Result Value Ref Range Status   Specimen Description BLOOD LEFT ANTECUBITAL  Final   Special Requests   Final    BOTTLES DRAWN AEROBIC AND ANAEROBIC Blood Culture results may not be optimal due to an excessive volume of blood received in culture bottles   Culture   Final    NO GROWTH 1 DAY Performed at [facility] [address], [place], [place] [address]    Report Status PENDING  Incomplete  Culture, blood (Routine x 2)     Status: None (Preliminary result)   Collection Time: 03/12/17 11:30 PM  Result Value Ref Range Status   Specimen Description BLOOD RIGHT ANTECUBITAL  Final   Special Requests   Final    BOTTLES DRAWN AEROBIC AND ANAEROBIC Blood Culture adequate volume   Culture   Final    NO GROWTH 1 DAY Performed at [facility], [place] [address], [place], [place] [address]    Report Status PENDING  Incomplete         Radiology Studies: Dg Chest 2 View  Result Date: 03/12/2017 CLINICAL DATA:  Fever, cellulitis of right lower extremity. EXAM: CHEST  2 VIEW COMPARISON:  07/08/2009 FINDINGS: Heart and mediastinal contours are normal in size.  Low lung volumes with crowding of interstitial lung markings. No pneumonic consolidation, effusion or pulmonary edema. No acute nor suspicious osseous abnormality. IMPRESSION: Low lung volumes with crowding of interstitial lung markings. No pneumonic consolidation is noted. Electronically Signed   By: Ashley Royalty M.D.   On: 03/12/2017 23:23        Scheduled Meds: . clonazePAM  0.5 mg Oral QHS  . folic acid  1 mg Oral Daily  . glimepiride  4 mg Oral Q breakfast  . insulin aspart  0-5 Units Subcutaneous QHS  .  insulin aspart  0-9 Units Subcutaneous TID WC  . lisinopril  20 mg Oral Daily  . predniSONE  2 mg Oral Q breakfast  . Warfarin - Pharmacist Dosing Inpatient   Does not apply q1800   Continuous Infusions: . levofloxacin (LEVAQUIN) IV Stopped (03/14/17 0318)  . vancomycin Stopped (03/14/17 1108)     LOS: 2 days    Time spent: > 35 minutes    Velvet Bathe, MD [facility] Pager [phone]  If 7PM-7AM, please contact night-coverage [email or web address] Password [email or web address] 03/14/2017, 6:10 PM

## 2017-03-14 NOTE — Progress Notes (Signed)
ANTICOAGULATION CONSULT NOTE - Follow Up Consult  Pharmacy Consult for warfarin  Indication: history of DVT  Allergies  Allergen Reactions  . Penicillins Hives    Patient Measurements: Height: 5\' 5"  (165.1 cm) Weight: (!) 306 lb 4.8 oz (138.9 kg) IBW/kg (Calculated) : 57   Vital Signs: Temp: 98.3 F (36.8 C) (09/28 0500) Temp Source: Oral (09/28 0500) BP: 132/69 (09/28 0500) Pulse Rate: 91 (09/28 0500)  Labs:  Recent Labs  03/12/17 2250 03/13/17 0339 03/13/17 0801 03/13/17 1452 03/14/17 0417  HGB 14.7 12.4  --   --  10.8*  HCT 45.3 39.4  --   --  33.3*  PLT 242 191  --   --  170  LABPROT 29.7*  --   --   --  31.4*  INR 2.85  --   --   --  3.06  CREATININE 0.89 0.72  --   --  0.65  TROPONINI  --  <0.03 <0.03 <0.03  --     Estimated Creatinine Clearance: 110 mL/min (by C-G formula based on SCr of 0.65 mg/dL).   Medications:  Scheduled:  . clonazePAM  0.5 mg Oral QHS  . folic acid  1 mg Oral Daily  . glimepiride  4 mg Oral Q breakfast  . insulin aspart  0-5 Units Subcutaneous QHS  . insulin aspart  0-9 Units Subcutaneous TID WC  . lisinopril  20 mg Oral Daily  . predniSONE  2 mg Oral Q breakfast  . Warfarin - Pharmacist Dosing Inpatient   Does not apply q1800    Assessment: 56 yo female admitted for cellulitis on PTA warfarin for history of DVT. INR was slightly above goal today at 3.06 up from 2.85 yesterday. Currently on Levofloxacin which can cause increased sensitivity to warfarin, thus requiring lower doses. PTA dose for today would be 2.5mg . CBC wnl, no reports of bleeding.   Goal of Therapy:  INR 2-3 Monitor platelets by anticoagulation protocol: Yes   Plan:  - Warfarin 1.5mg  x 1 - Monitor daily CBC/INR, reports of bleeding, DDIs   Thanks, Leanna Battles PharmD student 03/14/2017,11:24 AM

## 2017-03-14 NOTE — Progress Notes (Signed)
Pharmacy Antibiotic Note Brittany Tran is a 56 y.o. female admitted on 03/12/2017 with RLE cellulitis. Currently on day 2 of Levaquin and vancomycin for treatment.    Plan: 1. Continue Vancomycin 1000 mg IV q12h 2. Follow up clinical response and obtain vancomycin trough if needed   Height: 5\' 5"  (165.1 cm) Weight: (!) 306 lb 4.8 oz (138.9 kg) IBW/kg (Calculated) : 57  Temp (24hrs), Avg:99.1 F (37.3 C), Min:98.3 F (36.8 C), Max:100.4 F (38 C)   Recent Labs Lab 03/12/17 2250 03/12/17 2258 03/13/17 0339 03/14/17 0417  WBC 13.3*  --  17.9* 12.0*  CREATININE 0.89  --  0.72 0.65  LATICACIDVEN  --  3.48*  --   --     Estimated Creatinine Clearance: 110 mL/min (by C-G formula based on SCr of 0.65 mg/dL).    Allergies  Allergen Reactions  . Penicillins Hives    Vincenza Hews, PharmD, BCPS 03/14/2017, 11:56 AM

## 2017-03-14 NOTE — Discharge Instructions (Signed)

## 2017-03-15 LAB — BASIC METABOLIC PANEL
ANION GAP: 10 (ref 5–15)
BUN: 9 mg/dL (ref 6–20)
CALCIUM: 8.4 mg/dL — AB (ref 8.9–10.3)
CO2: 24 mmol/L (ref 22–32)
Chloride: 102 mmol/L (ref 101–111)
Creatinine, Ser: 0.8 mg/dL (ref 0.44–1.00)
Glucose, Bld: 124 mg/dL — ABNORMAL HIGH (ref 65–99)
Potassium: 3.5 mmol/L (ref 3.5–5.1)
Sodium: 136 mmol/L (ref 135–145)

## 2017-03-15 LAB — GLUCOSE, CAPILLARY
GLUCOSE-CAPILLARY: 117 mg/dL — AB (ref 65–99)
Glucose-Capillary: 108 mg/dL — ABNORMAL HIGH (ref 65–99)
Glucose-Capillary: 98 mg/dL (ref 65–99)

## 2017-03-15 LAB — PROTIME-INR
INR: 2.55
Prothrombin Time: 27.2 seconds — ABNORMAL HIGH (ref 11.4–15.2)

## 2017-03-15 MED ORDER — WARFARIN SODIUM 5 MG PO TABS: 5.0000 mg | ORAL_TABLET | ORAL | 0 refills | Status: DC

## 2017-03-15 MED ORDER — WARFARIN SODIUM 4 MG PO TABS
4.0000 mg | ORAL_TABLET | Freq: Once | ORAL | Status: AC
  Administered 2017-03-15: 4 mg via ORAL
  Filled 2017-03-15: qty 1

## 2017-03-15 MED ORDER — SULFAMETHOXAZOLE-TRIMETHOPRIM 800-160 MG PO TABS: 1.0000 | ORAL_TABLET | Freq: Two times a day (BID) | ORAL | 0 refills | Status: AC

## 2017-03-15 NOTE — Progress Notes (Signed)
ANTICOAGULATION CONSULT NOTE - Follow Up Consult  Pharmacy Consult for warfarin  Indication: history of DVT  Allergies  Allergen Reactions  . Penicillins Hives    Patient Measurements: Height: 5\' 5"  (165.1 cm) Weight: (!) 306 lb 4.8 oz (138.9 kg) IBW/kg (Calculated) : 57   Vital Signs: Temp: 98.3 F (36.8 C) (09/29 0535) Temp Source: Oral (09/29 0535) BP: 122/72 (09/29 0535) Pulse Rate: 85 (09/29 0535)  Labs:  Recent Labs  03/12/17 2250 03/13/17 0339 03/13/17 0801 03/13/17 1452 03/14/17 0417 03/15/17 0016  HGB 14.7 12.4  --   --  10.8*  --   HCT 45.3 39.4  --   --  33.3*  --   PLT 242 191  --   --  170  --   LABPROT 29.7*  --   --   --  31.4* 27.2*  INR 2.85  --   --   --  3.06 2.55  CREATININE 0.89 0.72  --   --  0.65 0.80  TROPONINI  --  <0.03 <0.03 <0.03  --   --     Estimated Creatinine Clearance: 110 mL/min (by C-G formula based on SCr of 0.8 mg/dL).   Medications:  Scheduled:  . clonazePAM  0.5 mg Oral QHS  . folic acid  1 mg Oral Daily  . glimepiride  4 mg Oral Q breakfast  . insulin aspart  0-5 Units Subcutaneous QHS  . insulin aspart  0-9 Units Subcutaneous TID WC  . lisinopril  20 mg Oral Daily  . predniSONE  2 mg Oral Q breakfast  . Warfarin - Pharmacist Dosing Inpatient   Does not apply q1800    Assessment: 56 yo female admitted for cellulitis, on warfarin PTA for history of DVT. INR remains therapeutic today at 2.55, but did significantly decrease from yesterday (3.06). Appears to have missed a dose on the day of admission (9/26) - PTA dose would have been 5mg . Currently on levofloxacin, which can enhance the anticoagulant effect of warfarin. No CBC this AM, H&H decreased yesterday and pltc WNL. No bleeding noted.   PTA warfarin dose: 5mg  and 2.5mg  PO on alternating days (last home dose 2.5mg  on 9/25)  Goal of Therapy:  INR 2-3 Monitor platelets by anticoagulation protocol: Yes   Plan:  - Warfarin 4mg  x 1 - Monitor daily CBC/INR, s/sx  of bleeding, DDIs    Erin N. Gerarda Fraction, PharmD PGY1 Pharmacy Resident Pager: [phone]

## 2017-03-15 NOTE — Progress Notes (Signed)
Brittany Tran to be D/C'd Home per MD order.  Discussed with the patient and all questions fully answered.  VSS, Skin clean, dry and intact without evidence of skin break down, no evidence of skin tears noted. IV catheter discontinued intact. Site without signs and symptoms of complications. Dressing and pressure applied.  An After Visit Summary was printed and given to the patient. Patient received prescription.  D/c education completed with patient/family including follow up instructions, medication list, d/c activities limitations if indicated, with other d/c instructions as indicated by MD - patient able to verbalize understanding, all questions fully answered.   Patient instructed to return to ED, call 911, or call MD for any changes in condition.   Patient escorted via [place], and D/C home via private auto.  Celine Ahr 03/15/2017 6:53 PM

## 2017-03-15 NOTE — Discharge Summary (Signed)
Physician Discharge Summary  Brittany Tran VZC:[id number] DOB: 1960-09-24 DOA: 03/12/2017  PCP: Patient, No Pcp Per  Admit date: 03/12/2017 Discharge date: 03/15/2017  Time spent: > 35 minutes  Recommendations for Outpatient Follow-up:  1. Monitor hemoglobin levels   Discharge Diagnoses:  Principal Problem:   Sepsis due to cellulitis [facility]) Active Problems:   Hypokalemia   Tachycardia   Fever, unspecified   Cellulitis of right lower leg   Discharge Condition: Stable  Diet recommendation: Heart healthy  Filed Weights   03/12/17 2234 03/13/17 0201 03/14/17 0500  Weight: 135.2 kg (298 lb) (!) 138.3 kg (304 lb 14.3 oz) (!) 138.9 kg (306 lb 4.8 oz)    History of present illness:   56 y.o. female, w diabetes, hypertension, RA, DVT (recurrent)  apparently c/o right distal lower extremity redness almost up to the knee.  Starting today.  + fever.  + chills.  Pt presented to [place] for evaluation  Hospital Course:  Sepsis - Secondary to cellulitis of source. - Blood culture remains negative  Right lower extremity cellulitis - Improved initially on Levaquin and vancomycin. Will transition to Bactrim - Hold methotrexate while patient has active infection  Otherwise for known medical problems listed above will continue prior to admission medications regimen listed below although will hold methotrexate. Will treat for 4 more days to treat for total of 7 days. I recommended patient follow-up with primary care physician within the next 7 days after hospital discharge.  Procedures:  None  Consultations:  None  Discharge Exam: Vitals:   03/15/17 0535 03/15/17 1426  BP: 122/72 (!) 141/95  Pulse: 85 85  Resp: 18 18  Temp: 98.3 F (36.8 C) 98.5 F (36.9 C)  SpO2: 96% 97%    General: Pt in nad, alert and awake Cardiovascular: rrr, no rubs Respiratory: no increased wob, no wheezes  Discharge Instructions   Discharge Instructions    Call MD for:  extreme  fatigue    Complete by:  As directed    Call MD for:  severe uncontrolled pain    Complete by:  As directed    Call MD for:  temperature >100.4    Complete by:  As directed    Diet - low sodium heart healthy    Complete by:  As directed    Discharge instructions    Complete by:  As directed    Please follow-up with your primary care physician after hospital discharge   Increase activity slowly    Complete by:  As directed      Current Discharge Medication List    START taking these medications   Details  sulfamethoxazole-trimethoprim (BACTRIM DS,SEPTRA DS) 800-160 MG tablet Take 1 tablet by mouth 2 (two) times daily. Qty: 8 tablet, Refills: 0      CONTINUE these medications which have CHANGED   Details  warfarin (COUMADIN) 5 MG tablet Take 1 tablet (5 mg total) by mouth See admin instructions. Take 2.5 mg by mouth every other day alternating with 5 mg by mouth every other day Qty: 30 tablet, Refills: 0      CONTINUE these medications which have NOT CHANGED   Details  acetaminophen (TYLENOL) 500 MG tablet Take 1,000 mg by mouth every 6 (six) hours as needed for moderate pain or headache.     folic acid (FOLVITE) 1 MG tablet Take 2 mg by mouth daily.     glucosamine-chondroitin 500-400 MG tablet Take 1 tablet by mouth 2 (two) times daily.  oxyCODONE-acetaminophen (PERCOCET) 10-325 MG per tablet Take 1 tablet by mouth every 4 (four) hours as needed for pain.    predniSONE (DELTASONE) 1 MG tablet Take 2 mg by mouth daily with breakfast.       STOP taking these medications     methotrexate (50 MG/ML) 1 g injection      clonazePAM (KLONOPIN) 0.5 MG tablet      glimepiride (AMARYL) 4 MG tablet      lisinopril (PRINIVIL,ZESTRIL) 20 MG tablet        Allergies  Allergen Reactions  . Penicillins Hives      The results of significant diagnostics from this hospitalization (including imaging, microbiology, ancillary and laboratory) are listed below for reference.     Significant Diagnostic Studies: Dg Chest 2 View  Result Date: 03/12/2017 CLINICAL DATA:  Fever, cellulitis of right lower extremity. EXAM: CHEST  2 VIEW COMPARISON:  07/08/2009 FINDINGS: Heart and mediastinal contours are normal in size. Low lung volumes with crowding of interstitial lung markings. No pneumonic consolidation, effusion or pulmonary edema. No acute nor suspicious osseous abnormality. IMPRESSION: Low lung volumes with crowding of interstitial lung markings. No pneumonic consolidation is noted. Electronically Signed   By: Ashley Royalty M.D.   On: 03/12/2017 23:23    Microbiology: Recent Results (from the past 240 hour(s))  Culture, blood (Routine x 2)     Status: None (Preliminary result)   Collection Time: 03/12/17 10:53 PM  Result Value Ref Range Status   Specimen Description BLOOD LEFT ANTECUBITAL  Final   Special Requests   Final    BOTTLES DRAWN AEROBIC AND ANAEROBIC Blood Culture results may not be optimal due to an excessive volume of blood received in culture bottles   Culture   Final    NO GROWTH 2 DAYS Performed at [facility] [address], [place], [place] [address]    Report Status PENDING  Incomplete  Culture, blood (Routine x 2)     Status: None (Preliminary result)   Collection Time: 03/12/17 11:30 PM  Result Value Ref Range Status   Specimen Description BLOOD RIGHT ANTECUBITAL  Final   Special Requests   Final    BOTTLES DRAWN AEROBIC AND ANAEROBIC Blood Culture adequate volume   Culture   Final    NO GROWTH 2 DAYS Performed at [facility], [place] [address] [place], [place] [address]    Report Status PENDING  Incomplete     Labs: Basic Metabolic Panel:  Recent Labs Lab 03/12/17 2250 03/13/17 0339 03/14/17 0417 03/15/17 0016  NA 138 139 137 136  K 3.4* 3.1* 3.1* 3.5  CL 102 106 105 102  CO2 27 23 26 24   GLUCOSE 187* 165* 114* 124*  BUN 12 9 9 9   CREATININE 0.89 0.72 0.65 0.80  CALCIUM 8.9 7.5* 7.7* 8.4*   Liver Function  Tests:  Recent Labs Lab 03/12/17 2250 03/13/17 0339  AST 35 26  ALT 19 15  ALKPHOS 128* 92  BILITOT 1.0 0.8  PROT 7.7 5.5*  ALBUMIN 3.8 2.6*   No results for input(s): LIPASE, AMYLASE in the last 168 hours. No results for input(s): AMMONIA in the last 168 hours. CBC:  Recent Labs Lab 03/12/17 2250 03/13/17 0339 03/14/17 0417  WBC 13.3* 17.9* 12.0*  NEUTROABS 12.1*  --  9.3*  HGB 14.7 12.4 10.8*  HCT 45.3 39.4 33.3*  MCV 94.2 94.5 93.5  PLT 242 191 170   Cardiac Enzymes:  Recent Labs Lab 03/13/17  3151 03/13/17 0801 03/13/17 1452  TROPONINI <0.03 <0.03 <0.03   BNP: BNP (last 3 results) No results for input(s): BNP in the last 8760 hours.  ProBNP (last 3 results) No results for input(s): PROBNP in the last 8760 hours.  CBG:  Recent Labs Lab 03/14/17 1709 03/14/17 2108 03/15/17 0831 03/15/17 1238 03/15/17 1648  GLUCAP 108* 112* 108* 117* 98    Signed:  Velvet Bathe MD.  [facility] 03/15/2017, 5:16 PM

## 2017-03-18 LAB — CULTURE, BLOOD (ROUTINE X 2)
CULTURE: NO GROWTH
CULTURE: NO GROWTH
SPECIAL REQUESTS: ADEQUATE

## 2017-03-22 ENCOUNTER — Encounter (HOSPITAL_BASED_OUTPATIENT_CLINIC_OR_DEPARTMENT_OTHER): Payer: Self-pay | Admitting: Emergency Medicine

## 2017-03-22 ENCOUNTER — Emergency Department (HOSPITAL_BASED_OUTPATIENT_CLINIC_OR_DEPARTMENT_OTHER)
Admission: EM | Admit: 2017-03-22 | Discharge: 2017-03-22 | Disposition: A | Discharge status: Home / Self Care | Payer: 59 | Attending: Emergency Medicine | Admitting: Emergency Medicine

## 2017-03-22 DIAGNOSIS — E119 Type 2 diabetes mellitus without complications: Secondary | ICD-10-CM | POA: Diagnosis not present

## 2017-03-22 DIAGNOSIS — Z79899 Other long term (current) drug therapy: Secondary | ICD-10-CM | POA: Diagnosis not present

## 2017-03-22 DIAGNOSIS — I10 Essential (primary) hypertension: Secondary | ICD-10-CM | POA: Diagnosis not present

## 2017-03-22 DIAGNOSIS — Z7901 Long term (current) use of anticoagulants: Secondary | ICD-10-CM | POA: Insufficient documentation

## 2017-03-22 DIAGNOSIS — M79604 Pain in right leg: Secondary | ICD-10-CM

## 2017-03-22 LAB — COMPREHENSIVE METABOLIC PANEL
ALT: 16 U/L (ref 14–54)
ANION GAP: 7 (ref 5–15)
AST: 20 U/L (ref 15–41)
Albumin: 3.3 g/dL — ABNORMAL LOW (ref 3.5–5.0)
Alkaline Phosphatase: 110 U/L (ref 38–126)
BUN: 13 mg/dL (ref 6–20)
CO2: 27 mmol/L (ref 22–32)
Calcium: 8.5 mg/dL — ABNORMAL LOW (ref 8.9–10.3)
Chloride: 104 mmol/L (ref 101–111)
Creatinine, Ser: 0.82 mg/dL (ref 0.44–1.00)
GFR calc Af Amer: >60 mL/min (ref >60)
GFR calc non Af Amer: >60 mL/min (ref >60)
GLUCOSE: 116 mg/dL — AB (ref 65–99)
POTASSIUM: 4.2 mmol/L (ref 3.5–5.1)
Sodium: 138 mmol/L (ref 135–145)
TOTAL PROTEIN: 7.6 g/dL (ref 6.5–8.1)
Total Bilirubin: 0.6 mg/dL (ref 0.3–1.2)

## 2017-03-22 LAB — CBC WITH DIFFERENTIAL/PLATELET
BASOS ABS: 0 10*3/uL (ref 0.0–0.1)
Basophils Relative: 0 %
EOS PCT: 2 %
Eosinophils Absolute: 0.1 10*3/uL (ref 0.0–0.7)
HEMATOCRIT: 39.6 % (ref 36.0–46.0)
HEMOGLOBIN: 12.7 g/dL (ref 12.0–15.0)
LYMPHS ABS: 1.4 10*3/uL (ref 0.7–4.0)
LYMPHS PCT: 20 %
MCH: 29.9 pg (ref 26.0–34.0)
MCHC: 32.1 g/dL (ref 30.0–36.0)
MCV: 93.2 fL (ref 78.0–100.0)
Monocytes Absolute: 0.3 10*3/uL (ref 0.1–1.0)
Monocytes Relative: 5 %
NEUTROS ABS: 5.2 10*3/uL (ref 1.7–7.7)
NEUTROS PCT: 73 %
Platelets: 307 10*3/uL (ref 150–400)
RBC: 4.25 MIL/uL (ref 3.87–5.11)
RDW: 14.6 % (ref 11.5–15.5)
WBC: 7.1 10*3/uL (ref 4.0–10.5)

## 2017-03-22 LAB — PROTIME-INR
INR: 3.1
Prothrombin Time: 31.7 seconds — ABNORMAL HIGH (ref 11.4–15.2)

## 2017-03-22 MED ORDER — HYDROMORPHONE HCL 1 MG/ML IJ SOLN
1.0000 mg | Freq: Once | INTRAMUSCULAR | Status: AC
  Administered 2017-03-22: 1 mg via INTRAMUSCULAR
  Filled 2017-03-22: qty 1

## 2017-03-22 MED ORDER — CYCLOBENZAPRINE HCL 10 MG PO TABS: 10.0000 mg | ORAL_TABLET | Freq: Two times a day (BID) | ORAL | 0 refills | Status: DC | PRN

## 2017-03-22 NOTE — ED Triage Notes (Signed)
Pt c/o RLE pain - sts was admitted last week for RLE cellulitis

## 2017-03-22 NOTE — Discharge Instructions (Signed)
Flexeril can be taken up to twice daily for muscle pain and tightness.   Please call and follow up with Dr. early with vascular surgery.  If you develop new or worsening symptoms, including a fever, if the leg continues to get red, hot, or swollen, please return to the emergency department for re-evaluation.

## 2017-03-22 NOTE — ED Notes (Signed)
Pt reports right leg pain that she states started after receiving the flu vaccine.

## 2017-03-22 NOTE — ED Provider Notes (Signed)
[facility] DEPT [facility] Provider Note   CSN: [id number] Arrival date & time: 03/22/17  1321     History   Chief Complaint Chief Complaint  Patient presents with  . Leg Pain    HPI Brittany Tran is a 56 y.o. female with a h/o of DVT on warfarin, DM, HTN, and cellulitis who presents to the Emergency Department with worsening RLE pain. She was d/ced from the hospital on 9/29 with bactrim after being treated for sepsis secondary to cellulitis. She reports she finished her course of bactrim earlier this week. She reports the pain seemed to be improving, but started to worked after she finished her antibiotics ~10/3. No fever or chills. No purulent drainage. She reports her pain improves with elevating the leg, but reports that her pain worsens when she gets up and starts to walk. No treatment prior to arrival.   The history is provided by the patient. No language interpreter was used.    Past Medical History:  Diagnosis Date  . Cellulitis   . Diabetes mellitus without complication ([place])   . History of DVT (deep vein thrombosis)   . Hypertension   . Rheumatoid arthritis [facility])     Patient Active Problem List   Diagnosis Date Noted  . Sepsis due to cellulitis ([place]) 03/13/2017  . Hypokalemia 03/13/2017  . Tachycardia 03/13/2017  . Fever, unspecified 03/13/2017  . Cellulitis of right lower leg     Past Surgical History:  Procedure Laterality Date  . HARDWARE REMOVAL Left 09/29/2014   Procedure: REMOVAL OF DEEP IMPLANT OF LEFT FIBULA  AND TIBIA (SEPARATE INCISIONS);  Surgeon: Wylene Simmer, MD;  Location: [place];  Service: Orthopedics;  Laterality: Left;  . ORIF ANKLE FRACTURE  1988   left    OB History    No data available       Home Medications    Prior to Admission medications   Medication Sig Start Date End Date Taking? Authorizing Provider  acetaminophen (TYLENOL) 500 MG tablet Take 1,000 mg by mouth every 6 (six) hours as needed for moderate pain  or headache.     [provider]  cyclobenzaprine (FLEXERIL) 10 MG tablet Take 1 tablet (10 mg total) by mouth 2 (two) times daily as needed for muscle spasms. 03/22/17   Virginie Josten A, PA-C  folic acid (FOLVITE) 1 MG tablet Take 2 mg by mouth daily.     [provider]  glucosamine-chondroitin 500-400 MG tablet Take 1 tablet by mouth 2 (two) times daily.    [provider]  oxyCODONE-acetaminophen (PERCOCET) 10-325 MG per tablet Take 1 tablet by mouth every 4 (four) hours as needed for pain.    [provider]  predniSONE (DELTASONE) 1 MG tablet Take 2 mg by mouth daily with breakfast.     [provider]  warfarin (COUMADIN) 5 MG tablet Take 1 tablet (5 mg total) by mouth See admin instructions. Take 2.5 mg by mouth every other day alternating with 5 mg by mouth every other day 03/15/17   Velvet Bathe, MD    Family History Family History  Problem Relation Age of Onset  . Diabetes Mother     Social History Social History  Substance Use Topics  . Smoking status: Never Smoker  . Smokeless tobacco: Never Used  . Alcohol use No     Allergies   Penicillins   Review of Systems Review of Systems  Constitutional: Negative for activity change, chills and fever.  Respiratory: Negative  for shortness of breath.   Cardiovascular: Negative for chest pain.  Gastrointestinal: Negative for abdominal pain.  Musculoskeletal: Positive for gait problem and myalgias. Negative for arthralgias, back pain and joint swelling.  Skin: Negative for color change, rash and wound.  Neurological: Negative for dizziness, weakness, light-headedness, numbness and headaches.   Physical Exam Updated Vital Signs BP (!) 149/89 (BP Location: Left Arm)   Pulse 82   Temp 98.6 F (37 C) (Oral)   Resp 16   Ht 5\' 6"  (1.676 m)   Wt 132.5 kg (292 lb)   SpO2 98%   BMI 47.13 kg/m   Physical Exam  Constitutional: No distress.  HENT:  Head: Normocephalic.  Eyes:  Conjunctivae are normal.  Neck: Neck supple.  Cardiovascular: Normal rate, regular rhythm and normal heart sounds.  Exam reveals no gallop and no friction rub.   No murmur heard. Pulmonary/Chest: Effort normal and breath sounds normal. No respiratory distress. She has no wheezes. She has no rales.  Abdominal: Soft. She exhibits no distension.  Musculoskeletal:  RLE is mildly warmer than the LLE. No edema or erythema. Multiple varicose veins and dark purple discoloration, consistent with venous stasis. Diffusely TTP over the RLE. Able to bear weight on the bilateral lower extremities. Antalgic gait. DP and PT pulses are 2+ and symmetric. Sensation is intact throughout the bilateral LE and feet. FROM of the bilateral ankles and knees. 5/5 strength against resistance of the bilateral LEs.   Neurological: She is alert.  Skin: Skin is warm. No rash noted.  Psychiatric: Her behavior is normal.  Nursing note and vitals reviewed.    ED Treatments / Results  Labs (all labs ordered are listed, but only abnormal results are displayed) Labs Reviewed  PROTIME-INR - Abnormal; Notable for the following:       Result Value   Prothrombin Time 31.7 (*)    All other components within normal limits  COMPREHENSIVE METABOLIC PANEL - Abnormal; Notable for the following:    Glucose, Bld 116 (*)    Calcium 8.5 (*)    Albumin 3.3 (*)    All other components within normal limits  CBC WITH DIFFERENTIAL/PLATELET    EKG  EKG Interpretation None       Radiology No results found.  Procedures Procedures (including critical care time)  Medications Ordered in ED Medications  HYDROmorphone (DILAUDID) injection 1 mg (1 mg Intramuscular Given 03/22/17 1607)     Initial Impression / Assessment and Plan / ED Course  I have reviewed the triage vital signs and the nursing notes.  Pertinent labs & imaging results that were available during my care of the patient were reviewed by me and considered in my  medical decision making (see chart for details).     56 year old female with worsening RLE pain. She was d/ced on 9/29 with Bactrim after being treated for sepsis 2/2 to cellulitis of the RLE. Pain is alleviated with elevating the leg and resumes with ambulation. On coumadin for previous DVT. INR is therapeutic at 3.10. WBC 7.1. CMP is unremarkable. The patient was seen and evaluated with Dr. Laverta Baltimore, attending physician. Although the leg is mildly warm, I do not think this is cellulitis at this time. Afebrile. No tachycardia. She is normo- to slightly hypertensive at 130-140s/80-90s. She has many varicose veins in the bilateral lower extremities and discoloration that appears consistent with venous stasis dermatitis. Her symptoms sound more concerning for claudication. She was evaluated by vascular surgery several years ago. Will provide a  referral to vascular surgery. Pain controlled in the ED. Strict return precautions given. NAD. The patient is safe for d/c at this time.   Final Clinical Impressions(s) / ED Diagnoses   Final diagnoses:  Right leg pain    New Prescriptions Discharge Medication List as of 03/22/2017  4:18 PM    START taking these medications   Details  cyclobenzaprine (FLEXERIL) 10 MG tablet Take 1 tablet (10 mg total) by mouth 2 (two) times daily as needed for muscle spasms., Starting Sat 03/22/2017, Print         Jakia Kennebrew, August A, PA-C 03/24/17 2103    Margette Fast, MD 03/24/17 0830

## 2017-04-08 ENCOUNTER — Ambulatory Visit (INDEPENDENT_AMBULATORY_CARE_PROVIDER_SITE_OTHER): Payer: 59 | Admitting: Vascular Surgery

## 2017-04-08 ENCOUNTER — Encounter: Payer: Self-pay | Admitting: Vascular Surgery

## 2017-04-08 VITALS — BP 145/89 | HR 90 | Temp 96.9°F | Resp 14 | Ht 66.0 in | Wt 288.0 lb

## 2017-04-08 DIAGNOSIS — I83893 Varicose veins of bilateral lower extremities with other complications: Secondary | ICD-10-CM | POA: Diagnosis not present

## 2017-04-08 NOTE — Progress Notes (Signed)
Subjective:     Patient ID: Brittany Tran, female   DOB: Nov 02, 1960, 56 y.o.   MRN: [id number]  HPI This 56 year old female was referred from the [facility] mid center for evaluation of possible venous insufficiency right leg. 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 early in this office in 2009 and also had a DVT in 2011. She is on chronic Coumadin therapy. She apparently developed cellulitis in the right ankle in late September which she has had in the past. She was treated with an antibiotic-questionable sulfa drug-and developed a rash throughout the right leg and on her back. This causes some discomfort in the calf and a scaly dermatitis which has been resolving over the past few weeks. She also has multiple small "broken veins" in both legs and chronic edema in both legs and does wear short leg elastic compression stockings on a chronic basis. She was referred here for further evaluation.  Past Medical History:  Diagnosis Date  . Cellulitis   . Diabetes mellitus without complication ([place])   . History of DVT (deep vein thrombosis)   . Hypertension   . Rheumatoid arthritis [facility])     Social History  Substance Use Topics  . Smoking status: Never Smoker  . Smokeless tobacco: Never Used  . Alcohol use No    Family History  Problem Relation Age of Onset  . Diabetes Mother     Allergies  Allergen Reactions  . Penicillins Hives     Current Outpatient Prescriptions:  .  acetaminophen (TYLENOL) 500 MG tablet, Take 1,000 mg by mouth every 6 (six) hours as needed for moderate pain or headache. , Disp: , Rfl:  .  cyclobenzaprine (FLEXERIL) 10 MG tablet, Take 1 tablet (10 mg total) by mouth 2 (two) times daily as needed for muscle spasms., Disp: 20 tablet, Rfl: 0 .  folic acid (FOLVITE) 1 MG tablet, Take 2 mg by mouth daily. , Disp: , Rfl:  .  glucosamine-chondroitin 500-400 MG tablet, Take 1 tablet by mouth 2 (two) times daily., Disp: , Rfl:  .   oxyCODONE-acetaminophen (PERCOCET/ROXICET) 5-325 MG tablet, TK 2 TS PO NIGHTLY PRN, Disp: , Rfl: 0 .  predniSONE (DELTASONE) 1 MG tablet, Take 2 mg by mouth daily with breakfast. , Disp: , Rfl:  .  warfarin (COUMADIN) 5 MG tablet, Take 1 tablet (5 mg total) by mouth See admin instructions. Take 2.5 mg by mouth every other day alternating with 5 mg by mouth every other day, Disp: 30 tablet, Rfl: 0 .  oxyCODONE-acetaminophen (PERCOCET) 10-325 MG per tablet, Take 1 tablet by mouth every 4 (four) hours as needed for pain., Disp: , Rfl:   Vitals:   04/08/17 1203  BP: (!) 145/89  Pulse: 90  Resp: 14  Temp: (!) 96.9 F (36.1 C)  SpO2: 96%  Weight: 288 lb (130.6 kg)  Height: 5\' 6"  (1.676 m)    Body mass index is 46.48 kg/m.         Review of Systems  Denies chest pain, dyspnea on exertion, PND, orthopnea, hemoptysis.    Objective:   Physical Exam BP (!) 145/89 (BP Location: Right Arm, Patient Position: Sitting, Cuff Size: Large)   Pulse 90   Temp (!) 96.9 F (36.1 C)   Resp 14   Ht 5\' 6"  (1.676 m)   Wt 288 lb (130.6 kg)   SpO2 96%   BMI 46.48 kg/m     Gen.-alert  and oriented x3 in no apparent distress HEENT normal for age Lungs no rhonchi or wheezing Cardiovascular regular rhythm no murmurs carotid pulses 3+ palpable no bruits audible Abdomen soft nontender no palpable masses-obese Musculoskeletal free of  major deformities Skin clear -no rashes Neurologic normal Lower extremities 3+ femoral and dorsalis pedis pulses palpable bilaterally with 1+ edema bilaterally There is resolving scaly dermatitis condition in the right leg involving the lateral thigh and below the knee. She has chronic hyperpigmentation in both lower extremities no active ulceration. She has small areas of reticular veins in both legs particularly in the malleolar area. Some bulging varicosities are noted in the medial thigh distally in the left leg.  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 On the left the vein is quite large which is no change from previous studies and it does have reflux On the right side the vein is smaller in caliber with no obvious reflux       Assessment:     #1 recent pain right thigh and skin rash thought to be due to drug reaction-sulfa-now and resolving phase #2 history of multiple DVTs left leg-on chronic Coumadin therapy    Plan:     #1 no indication for any intervention in this lady would be at high risk of developing a DVT further procedures were performed I suspect her discomfort in the right leg is secondary to her drug reaction and does seem to be improving and she is in agreement with this No further treatment indicated from vascular standpoint

## 2017-05-01 DIAGNOSIS — Z7901 Long term (current) use of anticoagulants: Secondary | ICD-10-CM | POA: Insufficient documentation

## 2018-01-31 ENCOUNTER — Emergency Department (HOSPITAL_BASED_OUTPATIENT_CLINIC_OR_DEPARTMENT_OTHER)
Admission: EM | Admit: 2018-01-31 | Discharge: 2018-01-31 | Disposition: A | Discharge status: Home / Self Care | Payer: 59 | Attending: Emergency Medicine | Admitting: Emergency Medicine

## 2018-01-31 ENCOUNTER — Encounter (HOSPITAL_BASED_OUTPATIENT_CLINIC_OR_DEPARTMENT_OTHER): Payer: Self-pay | Admitting: Emergency Medicine

## 2018-01-31 ENCOUNTER — Other Ambulatory Visit: Payer: Self-pay

## 2018-01-31 DIAGNOSIS — L03115 Cellulitis of right lower limb: Secondary | ICD-10-CM | POA: Diagnosis not present

## 2018-01-31 DIAGNOSIS — E119 Type 2 diabetes mellitus without complications: Secondary | ICD-10-CM | POA: Diagnosis not present

## 2018-01-31 DIAGNOSIS — Z79899 Other long term (current) drug therapy: Secondary | ICD-10-CM | POA: Insufficient documentation

## 2018-01-31 DIAGNOSIS — R2241 Localized swelling, mass and lump, right lower limb: Secondary | ICD-10-CM | POA: Diagnosis present

## 2018-01-31 DIAGNOSIS — Z7901 Long term (current) use of anticoagulants: Secondary | ICD-10-CM | POA: Diagnosis not present

## 2018-01-31 DIAGNOSIS — I10 Essential (primary) hypertension: Secondary | ICD-10-CM | POA: Insufficient documentation

## 2018-01-31 MED ORDER — DOXYCYCLINE HYCLATE 100 MG PO TABS
100.0000 mg | ORAL_TABLET | Freq: Once | ORAL | Status: AC
  Administered 2018-01-31: 100 mg via ORAL
  Filled 2018-01-31: qty 1

## 2018-01-31 MED ORDER — DOXYCYCLINE HYCLATE 100 MG PO CAPS: 100.0000 mg | ORAL_CAPSULE | Freq: Two times a day (BID) | ORAL | 0 refills | Status: DC
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
## 2018-01-31 NOTE — Discharge Instructions (Addendum)
Take doxycycline twice daily for a week for skin infection. It may affect your coumadin level so recheck with your doctor in a week   See your doctor  Return to ER if you have worse redness, drainage, fever.

## 2018-02-18 ENCOUNTER — Ambulatory Visit (HOSPITAL_COMMUNITY)
Admission: EM | Admit: 2018-02-18 | Discharge: 2018-02-18 | Disposition: A | Discharge status: Home / Self Care | Payer: 59 | Attending: Family Medicine | Admitting: Family Medicine

## 2018-02-18 ENCOUNTER — Encounter (HOSPITAL_COMMUNITY): Payer: Self-pay | Admitting: Emergency Medicine

## 2018-02-18 DIAGNOSIS — Z5189 Encounter for other specified aftercare: Secondary | ICD-10-CM | POA: Diagnosis not present

## 2018-02-18 DIAGNOSIS — R600 Localized edema: Secondary | ICD-10-CM

## 2018-02-18 DIAGNOSIS — T24032A Burn of unspecified degree of left lower leg, initial encounter: Secondary | ICD-10-CM

## 2018-02-18 MED ORDER — FUROSEMIDE 20 MG PO TABS: 20.0000 mg | ORAL_TABLET | Freq: Every day | ORAL | 0 refills | Status: DC
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
## 2018-02-18 NOTE — Discharge Instructions (Signed)
Swelling happens when fluid collects in small spaces around tissues and organs inside the body. Another word for swelling is "edema." Some common parts of the body where people can have swelling are the lower legs or hands. This typically is worse in the areas of the body that are closest to the ground (because of gravity)  Symptoms of swelling can include puffiness of the skin, which can cause the skin to look stretched and shiny. This often occurs with swelling in the lower legs and can be worse after you sit or stand for a long time.  Treatment of edema includes several components: treatment of the underlying cause (if possible), reducing the amount of salt (sodium) in your diet, and, in many cases, use of a medication called a diuretic to eliminate excess fluid. Using compression stockings and elevating the legs may also be recommended.   
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
## 2018-03-07 ENCOUNTER — Emergency Department (HOSPITAL_BASED_OUTPATIENT_CLINIC_OR_DEPARTMENT_OTHER): Payer: 59

## 2018-03-07 ENCOUNTER — Encounter (HOSPITAL_BASED_OUTPATIENT_CLINIC_OR_DEPARTMENT_OTHER): Payer: Self-pay | Admitting: Emergency Medicine

## 2018-03-07 ENCOUNTER — Emergency Department (HOSPITAL_BASED_OUTPATIENT_CLINIC_OR_DEPARTMENT_OTHER)
Admission: EM | Admit: 2018-03-07 | Discharge: 2018-03-07 | Disposition: A | Discharge status: Home / Self Care | Payer: 59 | Attending: Emergency Medicine | Admitting: Emergency Medicine

## 2018-03-07 ENCOUNTER — Other Ambulatory Visit: Payer: Self-pay

## 2018-03-07 DIAGNOSIS — L03116 Cellulitis of left lower limb: Secondary | ICD-10-CM | POA: Diagnosis not present

## 2018-03-07 DIAGNOSIS — E119 Type 2 diabetes mellitus without complications: Secondary | ICD-10-CM | POA: Insufficient documentation

## 2018-03-07 DIAGNOSIS — I10 Essential (primary) hypertension: Secondary | ICD-10-CM | POA: Diagnosis not present

## 2018-03-07 DIAGNOSIS — Z79899 Other long term (current) drug therapy: Secondary | ICD-10-CM | POA: Diagnosis not present

## 2018-03-07 DIAGNOSIS — M79605 Pain in left leg: Secondary | ICD-10-CM | POA: Diagnosis present

## 2018-03-07 DIAGNOSIS — Z7901 Long term (current) use of anticoagulants: Secondary | ICD-10-CM | POA: Diagnosis not present

## 2018-03-07 DIAGNOSIS — Z86718 Personal history of other venous thrombosis and embolism: Secondary | ICD-10-CM | POA: Insufficient documentation

## 2018-03-07 LAB — BASIC METABOLIC PANEL
Anion gap: 11 (ref 5–15)
BUN: 12 mg/dL (ref 6–20)
CALCIUM: 8.1 mg/dL — AB (ref 8.9–10.3)
CHLORIDE: 98 mmol/L (ref 98–111)
CO2: 28 mmol/L (ref 22–32)
CREATININE: 0.81 mg/dL (ref 0.44–1.00)
GFR calc non Af Amer: >60 mL/min (ref >60)
GLUCOSE: 243 mg/dL — AB (ref 70–99)
Potassium: 3.5 mmol/L (ref 3.5–5.1)
Sodium: 137 mmol/L (ref 135–145)

## 2018-03-07 LAB — PROTIME-INR
INR: 3.77
Prothrombin Time: 36.9 seconds — ABNORMAL HIGH (ref 11.4–15.2)

## 2018-03-07 LAB — CBC WITH DIFFERENTIAL/PLATELET
BASOS PCT: 0 %
Basophils Absolute: 0 10*3/uL (ref 0.0–0.1)
EOS ABS: 0 10*3/uL (ref 0.0–0.7)
EOS PCT: 0 %
HEMATOCRIT: 39.9 % (ref 36.0–46.0)
Hemoglobin: 13.4 g/dL (ref 12.0–15.0)
Lymphocytes Relative: 10 %
Lymphs Abs: 1 10*3/uL (ref 0.7–4.0)
MCH: 32.3 pg (ref 26.0–34.0)
MCHC: 33.6 g/dL (ref 30.0–36.0)
MCV: 96.1 fL (ref 78.0–100.0)
MONO ABS: 0.3 10*3/uL (ref 0.1–1.0)
Monocytes Relative: 3 %
Neutro Abs: 8.9 10*3/uL — ABNORMAL HIGH (ref 1.7–7.7)
Neutrophils Relative %: 87 %
PLATELETS: 358 10*3/uL (ref 150–400)
RBC: 4.15 MIL/uL (ref 3.87–5.11)
RDW: 14.7 % (ref 11.5–15.5)
WBC: 10.2 10*3/uL (ref 4.0–10.5)

## 2018-03-07 MED ORDER — OXYCODONE-ACETAMINOPHEN 5-325 MG PO TABS
2.0000 | ORAL_TABLET | Freq: Once | ORAL | Status: AC
  Administered 2018-03-07: 2 via ORAL
  Filled 2018-03-07: qty 2

## 2018-03-07 MED ORDER — VANCOMYCIN HCL IN DEXTROSE 1-5 GM/200ML-% IV SOLN
1000.0000 mg | Freq: Once | INTRAVENOUS | Status: AC
  Administered 2018-03-07: 1000 mg via INTRAVENOUS
  Filled 2018-03-07: qty 200

## 2018-03-07 MED ORDER — FENTANYL CITRATE (PF) 100 MCG/2ML IJ SOLN
50.0000 ug | Freq: Once | INTRAMUSCULAR | Status: AC
  Administered 2018-03-07: 50 ug via INTRAVENOUS
  Filled 2018-03-07: qty 2

## 2018-03-07 NOTE — ED Triage Notes (Signed)
Patient states that she went to another hospital last Friday and treated for cellulitis to the left lower leg. Patient reports that it is not getting any better

## 2018-03-07 NOTE — ED Notes (Signed)
US in process

## 2018-03-07 NOTE — ED Provider Notes (Signed)
[facility] EMERGENCY DEPARTMENT Provider Note   CSN: [id number] Arrival date & time: 03/07/18  1757     History   Chief Complaint Chief Complaint  Patient presents with  . Leg Pain    HPI Rasheema Truluck is a 57 y.o. female.  Patient is a 57 year old female who presents with swelling and redness to her left leg.  She has a history of venous stasis in the leg as well as a prior history of DVT and prior cellulitis in the leg.  She was admitted in 2018 for sepsis related to cellulitis of the leg.  She developed a burn to the medial aspect of the left ankle in August after she had a hot stone massage of the leg.  She was seen in the emergency department in August for the burn.  She states it had been doing well.  She followed up with an urgent care on September 4 and it appeared to be doing well.  She had noticed increased redness and swelling in the leg and went to a hospital in [place] to the emergency room and was diagnosed with cellulitis.  She was started on clindamycin 300 mg 4 times a day for 14 days.  She started this 1 week ago and she states that the redness is about the same.  It has not increased.  The burn she states is slowly improving.  However she continues to have pain in the leg, primarily on ambulation.  She denies any fevers.  She was initially having some fevers and chills which is why she went to the emergency room in [place] initially.  She feels like the antibiotic has cleared that up.     Past Medical History:  Diagnosis Date  . Cellulitis   . Diabetes mellitus without complication ([place])   . History of DVT (deep vein thrombosis)   . Hypertension   . Rheumatoid arthritis [facility])     Patient Active Problem List   Diagnosis Date Noted  . Varicose veins of bilateral lower extremities with other complications 46/96/2952  . Sepsis due to cellulitis ([place]) 03/13/2017  . Hypokalemia 03/13/2017  . Tachycardia 03/13/2017  . Fever, unspecified 03/13/2017  .  Cellulitis of right lower leg     Past Surgical History:  Procedure Laterality Date  . HARDWARE REMOVAL Left 09/29/2014   Procedure: REMOVAL OF DEEP IMPLANT OF LEFT FIBULA  AND TIBIA (SEPARATE INCISIONS);  Surgeon: Wylene Simmer, MD;  Location: [facility];  Service: Orthopedics;  Laterality: Left;  . ORIF ANKLE FRACTURE  1988   left     OB History   None      Home Medications    Prior to Admission medications   Medication Sig Start Date End Date Taking? Authorizing Provider  acetaminophen (TYLENOL) 500 MG tablet Take 1,000 mg by mouth every 6 (six) hours as needed for moderate pain or headache.     [provider]  cyclobenzaprine (FLEXERIL) 10 MG tablet Take 1 tablet (10 mg total) by mouth 2 (two) times daily as needed for muscle spasms. 03/22/17   McDonald, Mia A, PA-C  doxycycline (VIBRAMYCIN) 100 MG capsule Take 1 capsule (100 mg total) by mouth 2 (two) times daily. One po bid x 7 days Patient not taking: Reported on 02/18/2018 01/31/18   Drenda Freeze, MD  folic acid (FOLVITE) 1 MG tablet Take 2 mg by mouth daily.     [provider]  furosemide (LASIX) 20 MG tablet Take 1 tablet (  20 mg total) by mouth daily. 02/18/18   Vanessa Kick, MD  glucosamine-chondroitin 500-400 MG tablet Take 1 tablet by mouth 2 (two) times daily.    [provider]  oxyCODONE-acetaminophen (PERCOCET) 10-325 MG per tablet Take 1 tablet by mouth every 4 (four) hours as needed for pain.    [provider]  oxyCODONE-acetaminophen (PERCOCET/ROXICET) 5-325 MG tablet TK 2 TS PO NIGHTLY PRN 03/26/17   [provider]  predniSONE (DELTASONE) 1 MG tablet Take 2 mg by mouth daily with breakfast.     [provider]  warfarin (COUMADIN) 5 MG tablet Take 1 tablet (5 mg total) by mouth See admin instructions. Take 2.5 mg by mouth every other day alternating with 5 mg by mouth every other day 03/15/17   Velvet Bathe, MD    Family History Family  History  Problem Relation Age of Onset  . Diabetes Mother     Social History Social History   Tobacco Use  . Smoking status: Never Smoker  . Smokeless tobacco: Never Used  Substance Use Topics  . Alcohol use: No  . Drug use: No     Allergies   Penicillins and Sulfa antibiotics   Review of Systems Review of Systems  Constitutional: Negative for chills, diaphoresis, fatigue and fever.  HENT: Negative for congestion, rhinorrhea and sneezing.   Eyes: Negative.   Respiratory: Negative for cough, chest tightness and shortness of breath.   Cardiovascular: Positive for leg swelling. Negative for chest pain.  Gastrointestinal: Negative for abdominal pain, blood in stool, diarrhea, nausea and vomiting.  Genitourinary: Negative for difficulty urinating, flank pain, frequency and hematuria.  Musculoskeletal: Negative for arthralgias and back pain.       Left leg pain  Skin: Negative for rash.  Neurological: Negative for dizziness, speech difficulty, weakness, numbness and headaches.     Physical Exam Updated Vital Signs BP (!) 141/83 (BP Location: Right Wrist)   Pulse 93   Temp 98.8 F (37.1 C) (Oral)   Resp 18   Ht 5\' 5"  (1.651 m)   Wt 136.1 kg   SpO2 97%   BMI 49.92 kg/m   Physical Exam  Constitutional: She is oriented to person, place, and time. She appears well-developed and well-nourished.  HENT:  Head: Normocephalic and atraumatic.  Eyes: Pupils are equal, round, and reactive to light.  Neck: Normal range of motion. Neck supple.  Cardiovascular: Normal rate, regular rhythm and normal heart sounds.  Pulmonary/Chest: Effort normal and breath sounds normal. No respiratory distress. She has no wheezes. She has no rales. She exhibits no tenderness.  Abdominal: Soft. Bowel sounds are normal. There is no tenderness. There is no rebound and no guarding.  Musculoskeletal: Normal range of motion. She exhibits edema.  Patient has some woody edema to the left lower leg,  primarily around the left ankle and the lower leg.  Pedal pulses are intact.  She has some chronic skin changes around the ankle and lower leg.  She has an open wound wound with exudative material to the medial aspect of the left ankle.  There is some erythema and warmth overlying the ankle and lower leg.  It does not extend past the knee.  There is generalized tenderness on palpation of the leg.  Lymphadenopathy:    She has no cervical adenopathy.  Neurological: She is alert and oriented to person, place, and time.  Skin: Skin is warm and dry. No rash noted.  Psychiatric: She has a normal mood and affect.  ED Treatments / Results  Labs (all labs ordered are listed, but only abnormal results are displayed) Labs Reviewed  BASIC METABOLIC PANEL - Abnormal; Notable for the following components:      Result Value   Glucose, Bld 243 (*)    Calcium 8.1 (*)    All other components within normal limits  CBC WITH DIFFERENTIAL/PLATELET - Abnormal; Notable for the following components:   Neutro Abs 8.9 (*)    All other components within normal limits  PROTIME-INR - Abnormal; Notable for the following components:   Prothrombin Time 36.9 (*)    All other components within normal limits  I-STAT CG4 LACTIC ACID, ED  I-STAT CG4 LACTIC ACID, ED    EKG None  Radiology US Venous Img Lower Unilateral Left  Result Date: 03/07/2018 CLINICAL DATA:  Left lower leg pain and redness EXAM: LEFT LOWER EXTREMITY VENOUS DOPPLER ULTRASOUND TECHNIQUE: Gray-scale sonography with graded compression, as well as color Doppler and duplex ultrasound were performed to evaluate the lower extremity deep venous systems from the level of the common femoral vein and including the common femoral, femoral, profunda femoral, popliteal and calf veins including the posterior tibial, peroneal and gastrocnemius veins when visible. The superficial great saphenous vein was also interrogated. Spectral Doppler was utilized to  evaluate flow at rest and with distal augmentation maneuvers in the common femoral, femoral and popliteal veins. COMPARISON:  None. FINDINGS: Contralateral Common Femoral Vein: Respiratory phasicity is normal and symmetric with the symptomatic side. No evidence of thrombus. Normal compressibility. Common Femoral Vein: No evidence of thrombus. Normal compressibility, respiratory phasicity and response to augmentation. Saphenofemoral Junction: No evidence of thrombus. Normal compressibility and flow on color Doppler imaging. Profunda Femoral Vein: No evidence of thrombus. Normal compressibility and flow on color Doppler imaging. Femoral Vein: No evidence of thrombus. Normal compressibility, respiratory phasicity and response to augmentation. Popliteal Vein: No evidence of thrombus. Normal compressibility, respiratory phasicity and response to augmentation. Calf Veins: No evidence of thrombus. Normal compressibility and flow on color Doppler imaging. Superficial Great Saphenous Vein: No evidence of thrombus. Normal compressibility. Venous Reflux:  None. Other Findings:  None. IMPRESSION: No evidence of deep venous thrombosis. Electronically Signed   By: Rolm Baptise M.D.   On: 03/07/2018 20:20    Procedures Procedures (including critical care time)  Medications Ordered in ED Medications  fentaNYL (SUBLIMAZE) injection 50 mcg (50 mcg Intravenous Given 03/07/18 1907)  vancomycin (VANCOCIN) IVPB 1000 mg/200 mL premix (0 mg Intravenous Stopped 03/07/18 2314)     Initial Impression / Assessment and Plan / ED Course  I have reviewed the triage vital signs and the nursing notes.  Pertinent labs & imaging results that were available during my care of the patient were reviewed by me and considered in my medical decision making (see chart for details).     Patient is a 57 year old female who presents with pain to her left lower leg.  She has history of recurrent cellulitis.  She had an ultrasound done today  which shows no evidence of acute DVT.  Her INR is therapeutic.  Her labs are non-concerning.  It sounds like her symptoms are overall getting better and that the redness is slightly improved.  Certainly not worsening.  Her burn has slowly improved.  Her fever and chills have resolved on the clindamycin.  She feels like she may need 1 dose of IV antibiotics.  She was given 1 dose of vancomycin in the ED and will continue the clindamycin at home.  I do not feel this point that she needs admission for IV antibiotics.  I did advise her to have close follow-up with her PCP for recheck.  She was given strict return precautions.  She is on chronic oxycodone which is prescribed by her rheumatologist.  She states that she is run out of her medications and that she is able to pick up a new prescription from her rheumatologist but not until Monday.  She was given a dose of Percocet for tonight.  Final Clinical Impressions(s) / ED Diagnoses   Final diagnoses:  Cellulitis of left lower extremity    ED Discharge Orders    None       Malvin Johns, MD 03/07/18 [phone]

## 2018-03-07 NOTE — ED Notes (Signed)
Pt requesting medication for pain prior to [place]. She states she can get a ride home. Dr. Tamera Punt made aware

## 2018-03-07 NOTE — ED Notes (Signed)
DC instructions given to pt and family member, pt were upset that she couldn't get the percocet to take with her home. Pt oriented that this is a narcotic and we are not able to give the narcotic to take with her at home.

## 2018-03-07 NOTE — ED Notes (Signed)
LAC results 1.31 hand delivered to Dr. Tamera Punt

## 2018-03-07 NOTE — ED Notes (Signed)
ED Provider at bedside. 

## 2018-03-07 NOTE — ED Notes (Addendum)
[place] at bedside, pt complaining of pain. RN aware.

## 2018-03-09 LAB — I-STAT CG4 LACTIC ACID, ED
LACTIC ACID, VENOUS: 1.31 mmol/L (ref 0.5–1.9)
Lactic Acid, Venous: 1.44 mmol/L (ref 0.5–1.9)

## 2018-05-18 ENCOUNTER — Emergency Department (HOSPITAL_BASED_OUTPATIENT_CLINIC_OR_DEPARTMENT_OTHER): Payer: Self-pay

## 2018-05-18 ENCOUNTER — Inpatient Hospital Stay (HOSPITAL_BASED_OUTPATIENT_CLINIC_OR_DEPARTMENT_OTHER)
Admission: EM | Admit: 2018-05-18 | Discharge: 2018-05-21 | DRG: 603 | Disposition: A | Discharge status: Home / Self Care | Payer: Self-pay | Attending: Family Medicine | Admitting: Family Medicine

## 2018-05-18 ENCOUNTER — Other Ambulatory Visit: Payer: Self-pay

## 2018-05-18 ENCOUNTER — Encounter (HOSPITAL_BASED_OUTPATIENT_CLINIC_OR_DEPARTMENT_OTHER): Payer: Self-pay | Admitting: Emergency Medicine

## 2018-05-18 DIAGNOSIS — L02416 Cutaneous abscess of left lower limb: Secondary | ICD-10-CM | POA: Diagnosis present

## 2018-05-18 DIAGNOSIS — Z86718 Personal history of other venous thrombosis and embolism: Secondary | ICD-10-CM

## 2018-05-18 DIAGNOSIS — L97929 Non-pressure chronic ulcer of unspecified part of left lower leg with unspecified severity: Secondary | ICD-10-CM | POA: Diagnosis present

## 2018-05-18 DIAGNOSIS — Z880 Allergy status to penicillin: Secondary | ICD-10-CM

## 2018-05-18 DIAGNOSIS — L03116 Cellulitis of left lower limb: Principal | ICD-10-CM | POA: Diagnosis present

## 2018-05-18 DIAGNOSIS — M069 Rheumatoid arthritis, unspecified: Secondary | ICD-10-CM | POA: Diagnosis present

## 2018-05-18 DIAGNOSIS — I82409 Acute embolism and thrombosis of unspecified deep veins of unspecified lower extremity: Secondary | ICD-10-CM | POA: Diagnosis present

## 2018-05-18 DIAGNOSIS — L270 Generalized skin eruption due to drugs and medicaments taken internally: Secondary | ICD-10-CM | POA: Diagnosis not present

## 2018-05-18 DIAGNOSIS — Z79899 Other long term (current) drug therapy: Secondary | ICD-10-CM

## 2018-05-18 DIAGNOSIS — Z882 Allergy status to sulfonamides status: Secondary | ICD-10-CM

## 2018-05-18 DIAGNOSIS — T368X5A Adverse effect of other systemic antibiotics, initial encounter: Secondary | ICD-10-CM | POA: Diagnosis not present

## 2018-05-18 DIAGNOSIS — M797 Fibromyalgia: Secondary | ICD-10-CM | POA: Diagnosis present

## 2018-05-18 DIAGNOSIS — I872 Venous insufficiency (chronic) (peripheral): Secondary | ICD-10-CM | POA: Diagnosis present

## 2018-05-18 DIAGNOSIS — Z7901 Long term (current) use of anticoagulants: Secondary | ICD-10-CM

## 2018-05-18 DIAGNOSIS — I10 Essential (primary) hypertension: Secondary | ICD-10-CM | POA: Diagnosis present

## 2018-05-18 DIAGNOSIS — L0390 Cellulitis, unspecified: Secondary | ICD-10-CM | POA: Diagnosis present

## 2018-05-18 DIAGNOSIS — Z7952 Long term (current) use of systemic steroids: Secondary | ICD-10-CM

## 2018-05-18 DIAGNOSIS — Z881 Allergy status to other antibiotic agents status: Secondary | ICD-10-CM

## 2018-05-18 DIAGNOSIS — E119 Type 2 diabetes mellitus without complications: Secondary | ICD-10-CM | POA: Diagnosis present

## 2018-05-18 HISTORY — DX: Fibromyalgia: M79.7

## 2018-05-18 LAB — PROTIME-INR
INR: 2.14
Prothrombin Time: 23.6 seconds — ABNORMAL HIGH (ref 11.4–15.2)

## 2018-05-18 LAB — BASIC METABOLIC PANEL
ANION GAP: 7 (ref 5–15)
BUN: 8 mg/dL (ref 6–20)
CHLORIDE: 104 mmol/L (ref 98–111)
CO2: 28 mmol/L (ref 22–32)
Calcium: 8.4 mg/dL — ABNORMAL LOW (ref 8.9–10.3)
Creatinine, Ser: 0.6 mg/dL (ref 0.44–1.00)
GFR calc Af Amer: >60 mL/min (ref >60)
Glucose, Bld: 156 mg/dL — ABNORMAL HIGH (ref 70–99)
POTASSIUM: 3.5 mmol/L (ref 3.5–5.1)
SODIUM: 139 mmol/L (ref 135–145)

## 2018-05-18 LAB — CBC WITH DIFFERENTIAL/PLATELET
Abs Immature Granulocytes: 0.02 10*3/uL (ref 0.00–0.07)
BASOS ABS: 0 10*3/uL (ref 0.0–0.1)
Basophils Relative: 0 %
EOS PCT: 1 %
Eosinophils Absolute: 0.1 10*3/uL (ref 0.0–0.5)
HEMATOCRIT: 41.4 % (ref 36.0–46.0)
HEMOGLOBIN: 12.9 g/dL (ref 12.0–15.0)
IMMATURE GRANULOCYTES: 0 %
LYMPHS ABS: 2.1 10*3/uL (ref 0.7–4.0)
LYMPHS PCT: 24 %
MCH: 30.9 pg (ref 26.0–34.0)
MCHC: 31.2 g/dL (ref 30.0–36.0)
MCV: 99 fL (ref 80.0–100.0)
MONOS PCT: 6 %
Monocytes Absolute: 0.5 10*3/uL (ref 0.1–1.0)
NRBC: 0 % (ref 0.0–0.2)
Neutro Abs: 5.9 10*3/uL (ref 1.7–7.7)
Neutrophils Relative %: 69 %
Platelets: 279 10*3/uL (ref 150–400)
RBC: 4.18 MIL/uL (ref 3.87–5.11)
RDW: 14.7 % (ref 11.5–15.5)
WBC: 8.6 10*3/uL (ref 4.0–10.5)

## 2018-05-18 MED ORDER — CLINDAMYCIN PHOSPHATE 600 MG/50ML IV SOLN
600.0000 mg | Freq: Once | INTRAVENOUS | Status: AC
  Administered 2018-05-19: 600 mg via INTRAVENOUS
  Filled 2018-05-18: qty 50

## 2018-05-18 MED ORDER — IOPAMIDOL (ISOVUE-300) INJECTION 61%
100.0000 mL | Freq: Once | INTRAVENOUS | Status: AC | PRN
  Administered 2018-05-19: 100 mL via INTRAVENOUS

## 2018-05-18 NOTE — ED Triage Notes (Signed)
Pt c/o painful abscess on her left ankle and one of them busted with bleeding drainage.

## 2018-05-18 NOTE — ED Provider Notes (Signed)
[facility] EMERGENCY DEPARTMENT Provider Note   CSN: [id number] Arrival date & time: 05/18/18  2002     History   Chief Complaint Chief Complaint  Patient presents with  . Ankle Pain    HPI Brittany Tran is a 57 y.o. female.  The history is provided by the patient. No language interpreter was used.  Ankle Pain     Brittany Tran is a 57 y.o. female who presents to the Emergency Department complaining of ankle pain. She presents to the emergency department complaining of ankle pain and drainage. She has a history of rheumatoid arthritis and takes daily prednisone as well as Orencia. She has problems with chronic edema in her legs and is followed by the wound center. About a week ago she had a wrap removed and it was painful at the time of removal. Since that time she has had some mild pain in the leg. Today she noted that the leg was red and hot and more swollen with drainage to the left side of her ankle. She also noticed a spot on the right side when she arrived to the emergency department. She denies any fevers, chills. No history of diabetes. She does take Coumadin. She is able to walk on the foot but she does have pain in the leg and ankle on ambulation. Past Medical History:  Diagnosis Date  . Cellulitis   . Diabetes mellitus without complication ([place])   . History of DVT (deep vein thrombosis)   . Hypertension   . Rheumatoid arthritis [facility])     Patient Active Problem List   Diagnosis Date Noted  . Varicose veins of bilateral lower extremities with other complications 76/73/4193  . Sepsis due to cellulitis ([place]) 03/13/2017  . Hypokalemia 03/13/2017  . Tachycardia 03/13/2017  . Fever, unspecified 03/13/2017  . Cellulitis of right lower leg     Past Surgical History:  Procedure Laterality Date  . HARDWARE REMOVAL Left 09/29/2014   Procedure: REMOVAL OF DEEP IMPLANT OF LEFT FIBULA  AND TIBIA (SEPARATE INCISIONS);  Surgeon: Wylene Simmer, MD;  Location: [facility];  Service: Orthopedics;  Laterality: Left;  . ORIF ANKLE FRACTURE  1988   left     OB History   None      Home Medications    Prior to Admission medications   Medication Sig Start Date End Date Taking? Authorizing Provider  acetaminophen (TYLENOL) 500 MG tablet Take 1,000 mg by mouth every 6 (six) hours as needed for moderate pain or headache.     [provider]  cyclobenzaprine (FLEXERIL) 10 MG tablet Take 1 tablet (10 mg total) by mouth 2 (two) times daily as needed for muscle spasms. 03/22/17   McDonald, Mia A, PA-C  doxycycline (VIBRAMYCIN) 100 MG capsule Take 1 capsule (100 mg total) by mouth 2 (two) times daily. One po bid x 7 days Patient not taking: Reported on 02/18/2018 01/31/18   Drenda Freeze, MD  folic acid (FOLVITE) 1 MG tablet Take 2 mg by mouth daily.     [provider]  furosemide (LASIX) 20 MG tablet Take 1 tablet (20 mg total) by mouth daily. 02/18/18   Vanessa Kick, MD  glucosamine-chondroitin 500-400 MG tablet Take 1 tablet by mouth 2 (two) times daily.    [provider]  oxyCODONE-acetaminophen (PERCOCET) 10-325 MG per tablet Take 1 tablet by mouth every 4 (four) hours as needed for pain.    [provider]  oxyCODONE-acetaminophen (PERCOCET/ROXICET) 5-325 MG  tablet TK 2 TS PO NIGHTLY PRN 03/26/17   [provider]  predniSONE (DELTASONE) 1 MG tablet Take 2 mg by mouth daily with breakfast.     [provider]  warfarin (COUMADIN) 5 MG tablet Take 1 tablet (5 mg total) by mouth See admin instructions. Take 2.5 mg by mouth every other day alternating with 5 mg by mouth every other day 03/15/17   Velvet Bathe, MD    Family History Family History  Problem Relation Age of Onset  . Diabetes Mother     Social History Social History   Tobacco Use  . Smoking status: Never Smoker  . Smokeless tobacco: Never Used  Substance Use Topics  . Alcohol use: No  . Drug use: No     Allergies     Penicillins and Sulfa antibiotics   Review of Systems Review of Systems  All other systems reviewed and are negative.    Physical Exam Updated Vital Signs BP (!) 147/80 (BP Location: Right Arm)   Pulse 100   Temp 98.8 F (37.1 C) (Oral)   Resp 20   Ht 5\' 5"  (1.651 m)   Wt 134.7 kg   SpO2 97%   BMI 49.42 kg/m   Physical Exam  Constitutional: She is oriented to person, place, and time. She appears well-developed and well-nourished.  HENT:  Head: Normocephalic and atraumatic.  Cardiovascular: Regular rhythm.  No murmur heard. Tachycardic  Pulmonary/Chest: Effort normal and breath sounds normal. No respiratory distress.  Abdominal: Soft. There is no tenderness. There is no rebound and no guarding.  Musculoskeletal: She exhibits no tenderness.  2+ DP pulses bilaterally. There is moderate erythema and edema from the left mid foot to just distal to the left knee. There is significant diffuse tenderness to palpation throughout the leg and ankle. There is spontaneous drainage of purulent material from the lateral aspect of the mid left leg. There is pointing and fluctuates to the medial malleolous of the left ankle.  Flexion/extension intact at the ankle, knee.    Neurological: She is alert and oriented to person, place, and time.  Skin: Skin is warm and dry.  Psychiatric: She has a normal mood and affect. Her behavior is normal.  Nursing note and vitals reviewed.    ED Treatments / Results  Labs (all labs ordered are listed, but only abnormal results are displayed) Labs Reviewed  BASIC METABOLIC PANEL - Abnormal; Notable for the following components:      Result Value   Glucose, Bld 156 (*)    Calcium 8.4 (*)    All other components within normal limits  CBC WITH DIFFERENTIAL/PLATELET  PROTIME-INR    EKG None  Radiology Dg Tibia/fibula Left  Result Date: 05/18/2018 CLINICAL DATA:  Drainage from leg EXAM: LEFT TIBIA AND FIBULA - 2 VIEW COMPARISON:  None. FINDINGS:  Degenerative changes in the left knee and ankle joints. No acute bony abnormality. Specifically, no fracture, subluxation, or dislocation. Diffuse soft tissue calcifications. IMPRESSION: No acute bony abnormality. Electronically Signed   By: Rolm Baptise M.D.   On: 05/18/2018 22:14    Procedures .Marland KitchenIncision and Drainage Date/Time: 05/18/2018 11:39 PM Performed by: Quintella Reichert, MD Authorized by: Quintella Reichert, MD   Consent:    Consent obtained:  Verbal   Consent given by:  Patient   Risks discussed:  Incomplete drainage, infection, bleeding and pain Location:    Type:  Abscess   Location:  Lower extremity   Lower extremity location:  Ankle  Ankle location:  L ankle Pre-procedure details:    Skin preparation:  Antiseptic wash Anesthesia (see MAR for exact dosages):    Anesthesia method:  None Procedure type:    Complexity:  Simple Procedure details:    Needle aspiration: yes     Needle size:  18 G   Drainage:  Purulent   Drainage amount:  Moderate   Wound treatment:  Wound left open   Packing materials:  None Post-procedure details:    Patient tolerance of procedure:  Tolerated well, no immediate complications   (including critical care time)  Medications Ordered in ED Medications  clindamycin (CLEOCIN) IVPB 600 mg (has no administration in time range)  iopamidol (ISOVUE-300) 61 % injection 100 mL (has no administration in time range)     Initial Impression / Assessment and Plan / ED Course  I have reviewed the triage vital signs and the nursing notes.  Pertinent labs & imaging results that were available during my care of the patient were reviewed by me and considered in my medical decision making (see chart for details).     Patient with history of DVT, rheumatoid arthritis on coumadin, daily prednisone as well as Orencia that was started one week ago here for evaluation of swelling pain and spontaneous drainage to the left lower extremity. She has two separate  abscesses on the left lower extremity as well as significant cellulitis. The lateral abscess is continuously draining. The medial abscess was strained per note. Given significant edema and extent of infection CT lower extremity obtained to evaluate for additional abscesses. Will treat with antibiotics. Patient care transferred pending CT scan.  Final Clinical Impressions(s) / ED Diagnoses   Final diagnoses:  None    ED Discharge Orders    None       Quintella Reichert, MD 05/18/18 2342

## 2018-05-19 ENCOUNTER — Encounter (HOSPITAL_COMMUNITY): Payer: Self-pay | Admitting: Internal Medicine

## 2018-05-19 DIAGNOSIS — I10 Essential (primary) hypertension: Secondary | ICD-10-CM | POA: Diagnosis present

## 2018-05-19 DIAGNOSIS — E119 Type 2 diabetes mellitus without complications: Secondary | ICD-10-CM

## 2018-05-19 DIAGNOSIS — L0390 Cellulitis, unspecified: Secondary | ICD-10-CM | POA: Diagnosis present

## 2018-05-19 DIAGNOSIS — L03116 Cellulitis of left lower limb: Principal | ICD-10-CM

## 2018-05-19 DIAGNOSIS — I82409 Acute embolism and thrombosis of unspecified deep veins of unspecified lower extremity: Secondary | ICD-10-CM | POA: Diagnosis present

## 2018-05-19 DIAGNOSIS — M069 Rheumatoid arthritis, unspecified: Secondary | ICD-10-CM | POA: Diagnosis present

## 2018-05-19 LAB — HEMOGLOBIN A1C
Hgb A1c MFr Bld: 7.8 % — ABNORMAL HIGH (ref 4.8–5.6)
Mean Plasma Glucose: 177.16 mg/dL

## 2018-05-19 LAB — GLUCOSE, CAPILLARY
Glucose-Capillary: 179 mg/dL — ABNORMAL HIGH (ref 70–99)
Glucose-Capillary: 136 mg/dL — ABNORMAL HIGH (ref 70–99)

## 2018-05-19 MED ORDER — WARFARIN SODIUM 5 MG PO TABS
5.0000 mg | ORAL_TABLET | ORAL | Status: DC
  Administered 2018-05-19 – 2018-05-20 (×2): 5 mg via ORAL
  Filled 2018-05-19 (×3): qty 1

## 2018-05-19 MED ORDER — VANCOMYCIN HCL IN DEXTROSE 1-5 GM/200ML-% IV SOLN
1000.0000 mg | Freq: Two times a day (BID) | INTRAVENOUS | Status: DC
  Administered 2018-05-20: 1000 mg via INTRAVENOUS
  Filled 2018-05-19 (×2): qty 200

## 2018-05-19 MED ORDER — INSULIN ASPART 100 UNIT/ML ~~LOC~~ SOLN: 0.0000 [IU] | Freq: Three times a day (TID) | SUBCUTANEOUS | Status: DC

## 2018-05-19 MED ORDER — ONDANSETRON HCL 4 MG/2ML IJ SOLN: 4.0000 mg | Freq: Four times a day (QID) | INTRAMUSCULAR | Status: DC | PRN

## 2018-05-19 MED ORDER — VANCOMYCIN HCL 10 G IV SOLR
2500.0000 mg | Freq: Once | INTRAVENOUS | Status: DC
  Filled 2018-05-19: qty 2500

## 2018-05-19 MED ORDER — ACETAMINOPHEN 325 MG PO TABS: 650.0000 mg | ORAL_TABLET | Freq: Four times a day (QID) | ORAL | Status: DC | PRN

## 2018-05-19 MED ORDER — GLUCOSAMINE-CHONDROITIN 500-400 MG PO TABS: 1.0000 | ORAL_TABLET | Freq: Two times a day (BID) | ORAL | Status: DC

## 2018-05-19 MED ORDER — DIPHENHYDRAMINE HCL 50 MG/ML IJ SOLN
25.0000 mg | Freq: Four times a day (QID) | INTRAMUSCULAR | Status: DC | PRN
  Administered 2018-05-19 – 2018-05-20 (×3): 25 mg via INTRAVENOUS
  Filled 2018-05-19 (×3): qty 1

## 2018-05-19 MED ORDER — PREDNISONE 1 MG PO TABS
2.0000 mg | ORAL_TABLET | Freq: Every day | ORAL | Status: DC
  Administered 2018-05-20 – 2018-05-21 (×2): 2 mg via ORAL
  Filled 2018-05-19 (×2): qty 2

## 2018-05-19 MED ORDER — SODIUM CHLORIDE 0.9 % IV SOLN
INTRAVENOUS | Status: DC | PRN
  Administered 2018-05-19 – 2018-05-20 (×2): 250 mL via INTRAVENOUS

## 2018-05-19 MED ORDER — INSULIN ASPART 100 UNIT/ML ~~LOC~~ SOLN: 0.0000 [IU] | Freq: Every day | SUBCUTANEOUS | Status: DC

## 2018-05-19 MED ORDER — WARFARIN - PHYSICIAN DOSING INPATIENT: Freq: Every day | Status: DC

## 2018-05-19 MED ORDER — OXYCODONE-ACETAMINOPHEN 5-325 MG PO TABS
1.0000 | ORAL_TABLET | ORAL | Status: DC | PRN
  Administered 2018-05-19 – 2018-05-21 (×5): 2 via ORAL
  Filled 2018-05-19 (×5): qty 2

## 2018-05-19 MED ORDER — WARFARIN SODIUM 2.5 MG PO TABS: 2.5000 mg | ORAL_TABLET | ORAL | Status: DC

## 2018-05-19 MED ORDER — FOLIC ACID 1 MG PO TABS: 2.0000 mg | ORAL_TABLET | Freq: Every day | ORAL | Status: DC

## 2018-05-19 MED ORDER — OXYCODONE-ACETAMINOPHEN 5-325 MG PO TABS
2.0000 | ORAL_TABLET | Freq: Every day | ORAL | Status: DC
  Administered 2018-05-19 – 2018-05-20 (×2): 2 via ORAL
  Filled 2018-05-19 (×2): qty 2

## 2018-05-19 MED ORDER — HYDRALAZINE HCL 20 MG/ML IJ SOLN: 10.0000 mg | INTRAMUSCULAR | Status: DC | PRN

## 2018-05-19 MED ORDER — CYCLOBENZAPRINE HCL 10 MG PO TABS: 10.0000 mg | ORAL_TABLET | Freq: Two times a day (BID) | ORAL | Status: DC | PRN

## 2018-05-19 MED ORDER — CLINDAMYCIN PHOSPHATE 600 MG/50ML IV SOLN
600.0000 mg | Freq: Three times a day (TID) | INTRAVENOUS | Status: DC
  Filled 2018-05-19 (×3): qty 50
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
## 2018-05-19 NOTE — Social Work (Signed)
CSW acknowledging that there was a consult placed for social work, per consult details were called to the [facility] CSW yesterday evening. No indication of what consult is in regards to. CSW signing off. Please consult if any additional needs arise.  Alexander Mt, [place] Work [phone]

## 2018-05-19 NOTE — ED Provider Notes (Signed)
Care assumed from Dr. Ralene Bathe, CT of lower extremity pending.  Patient presented with cellulitis and abscess of left leg.  CT shows venous stasis changes but no drainable fluid collection.  Patient was initially resistant to admission, but is now agreeable to be admitted.  Case is discussed with Dr. Tamala Julian of [facility], who agrees to admit the patient.  Results for orders placed or performed during the hospital encounter of 05/18/18  CBC with Differential  Result Value Ref Range   WBC 8.6 4.0 - 10.5 K/uL   RBC 4.18 3.87 - 5.11 MIL/uL   Hemoglobin 12.9 12.0 - 15.0 g/dL   HCT 41.4 36.0 - 46.0 %   MCV 99.0 80.0 - 100.0 fL   MCH 30.9 26.0 - 34.0 pg   MCHC 31.2 30.0 - 36.0 g/dL   RDW 14.7 11.5 - 15.5 %   Platelets 279 150 - 400 K/uL   nRBC 0.0 0.0 - 0.2 %   Neutrophils Relative % 69 %   Neutro Abs 5.9 1.7 - 7.7 K/uL   Lymphocytes Relative 24 %   Lymphs Abs 2.1 0.7 - 4.0 K/uL   Monocytes Relative 6 %   Monocytes Absolute 0.5 0.1 - 1.0 K/uL   Eosinophils Relative 1 %   Eosinophils Absolute 0.1 0.0 - 0.5 K/uL   Basophils Relative 0 %   Basophils Absolute 0.0 0.0 - 0.1 K/uL   Immature Granulocytes 0 %   Abs Immature Granulocytes 0.02 0.00 - 0.07 K/uL  Protime-INR  Result Value Ref Range   Prothrombin Time 23.6 (H) 11.4 - 15.2 seconds   INR 9.52   Basic metabolic panel  Result Value Ref Range   Sodium 139 135 - 145 mmol/L   Potassium 3.5 3.5 - 5.1 mmol/L   Chloride 104 98 - 111 mmol/L   CO2 28 22 - 32 mmol/L   Glucose, Bld 156 (H) 70 - 99 mg/dL   BUN 8 6 - 20 mg/dL   Creatinine, Ser 0.60 0.44 - 1.00 mg/dL   Calcium 8.4 (L) 8.9 - 10.3 mg/dL   GFR calc non Af Amer >60 >60 mL/min   GFR calc Af Amer >60 >60 mL/min   Anion gap 7 5 - 15   Dg Tibia/fibula Left  Result Date: 05/18/2018 CLINICAL DATA:  Drainage from leg EXAM: LEFT TIBIA AND FIBULA - 2 VIEW COMPARISON:  None. FINDINGS: Degenerative changes in the left knee and ankle joints. No acute bony abnormality. Specifically, no  fracture, subluxation, or dislocation. Diffuse soft tissue calcifications. IMPRESSION: No acute bony abnormality. Electronically Signed   By: Rolm Baptise M.D.   On: 05/18/2018 22:14   Ct Extremity Lower Left W Contrast  Result Date: 05/19/2018 CLINICAL DATA:  Open wound and drainage in the lateral left lower leg. EXAM: CT OF THE LOWER LEFT EXTREMITY WITH CONTRAST TECHNIQUE: Multidetector CT imaging of the lower left extremity was performed according to the standard protocol following intravenous contrast administration. COMPARISON:  Plain films earlier today. CONTRAST:  133mL ISOVUE-300 IOPAMIDOL (ISOVUE-300) INJECTION 61% FINDINGS: No acute bony abnormality. No fracture. Degenerative changes in the left knee and ankle. No destructive bony abnormality. Extensive varicosities noted in the left lower extremity, most pronounced medially in the distal thigh and proximal calf. Subcutaneous calcifications compatible with chronic venous stasis. No focal fluid collection. IMPRESSION: Evidence of extensive chronic venous stasis disease. No focal fluid collection. No acute bony abnormality. Electronically Signed   By: Rolm Baptise M.D.   On: 05/19/2018 00:52  Delora Fuel, MD 28/78/67 [phone]

## 2018-05-19 NOTE — Progress Notes (Signed)
Pharmacy Antibiotic Note  Brittany Tran is a 57 y.o. female admitted on 05/18/2018 with cellulitis and abscess.  Pharmacy has been consulted for Vanco dosing.  CC/HPI: Leg swelling  PMH: HTN, RA, venous stasis ulcers, and DVT, cellulitis, DM, fibromyalgia,   ID: Cellulitis +abscess in immunosuppressed pt. WBC WNL, Afebrile. Scr WNL  Vancomycin 1000mg  IV Q 12 hrs. Goal AUC 400-550. Expected AUC: 489 SCr used: 0.8  Vanco 12/3>>  12/2 Wound abscess>>   Plan: Vancomycin 2500mg  IV x 1 then 1g IV q 12hrs    Height: 5\' 5"  (165.1 cm) Weight: 297 lb (134.7 kg) IBW/kg (Calculated) : 57  Temp (24hrs), Avg:98.8 F (37.1 C), Min:98.8 F (37.1 C), Max:98.8 F (37.1 C)  Recent Labs  Lab 05/18/18 2250  WBC 8.6  CREATININE 0.60    Estimated Creatinine Clearance: 106.6 mL/min (by C-G formula based on SCr of 0.6 mg/dL).    Allergies  Allergen Reactions  . Penicillins Hives  . Sulfa Antibiotics    Deni Berti S. Alford Highland, PharmD, BCPS Clinical Staff Pharmacist  Eilene Ghazi Stillinger 05/19/2018 10:47 AM

## 2018-05-19 NOTE — ED Notes (Signed)
Pt. Has positive pedal pulse in L foot.

## 2018-05-19 NOTE — Care Plan (Signed)
[facility] transfer discussed with Dr. Roxanne Mins.  Ms. Loveless is a 57 year old female with past medical history of HTN, rheumatoid arthritis  on prednisone as well as Orencia, venous stasis ulcers, and DVT on chronic anticoagulation; who presented with left ankle pain and swelling.  Labs revealed normal CBC.  Patient underwent I&D in the emergency room.  Follow-up CT of the left leg showed chronic venous stasis without signs of abscess.  Patient was given clindamycin.  TRH called to admit for continuation of antibiotics.  Accepted to a MedSurg bed at [facility] as inpatient.

## 2018-05-20 LAB — GLUCOSE, CAPILLARY
Glucose-Capillary: 157 mg/dL — ABNORMAL HIGH (ref 70–99)
Glucose-Capillary: 169 mg/dL — ABNORMAL HIGH (ref 70–99)
Glucose-Capillary: 145 mg/dL — ABNORMAL HIGH (ref 70–99)
Glucose-Capillary: 140 mg/dL — ABNORMAL HIGH (ref 70–99)

## 2018-05-20 LAB — BASIC METABOLIC PANEL
Anion gap: 10 (ref 5–15)
BUN: 5 mg/dL — ABNORMAL LOW (ref 6–20)
CO2: 27 mmol/L (ref 22–32)
Calcium: 8.1 mg/dL — ABNORMAL LOW (ref 8.9–10.3)
Chloride: 102 mmol/L (ref 98–111)
Creatinine, Ser: 0.71 mg/dL (ref 0.44–1.00)
GFR calc Af Amer: >60 mL/min (ref >60)
GFR calc non Af Amer: >60 mL/min (ref >60)
Glucose, Bld: 157 mg/dL — ABNORMAL HIGH (ref 70–99)
Potassium: 3.4 mmol/L — ABNORMAL LOW (ref 3.5–5.1)
Sodium: 139 mmol/L (ref 135–145)

## 2018-05-20 LAB — CBC
HCT: 41.7 % (ref 36.0–46.0)
Hemoglobin: 12.8 g/dL (ref 12.0–15.0)
MCH: 30 pg (ref 26.0–34.0)
MCHC: 30.7 g/dL (ref 30.0–36.0)
MCV: 97.7 fL (ref 80.0–100.0)
Platelets: 265 10*3/uL (ref 150–400)
RBC: 4.27 MIL/uL (ref 3.87–5.11)
RDW: 14.7 % (ref 11.5–15.5)
WBC: 7.6 10*3/uL (ref 4.0–10.5)
nRBC: 0 % (ref 0.0–0.2)

## 2018-05-20 LAB — PROTIME-INR
INR: 2.09
Prothrombin Time: 23.2 seconds — ABNORMAL HIGH (ref 11.4–15.2)

## 2018-05-20 LAB — HIV ANTIBODY (ROUTINE TESTING W REFLEX): HIV Screen 4th Generation wRfx: NONREACTIVE

## 2018-05-20 MED ORDER — HYDROXYZINE HCL 25 MG PO TABS
25.0000 mg | ORAL_TABLET | Freq: Three times a day (TID) | ORAL | Status: AC
  Administered 2018-05-20 – 2018-05-21 (×4): 25 mg via ORAL
  Filled 2018-05-20 (×4): qty 1

## 2018-05-20 MED ORDER — POTASSIUM CHLORIDE CRYS ER 20 MEQ PO TBCR
40.0000 meq | EXTENDED_RELEASE_TABLET | Freq: Once | ORAL | Status: AC
  Administered 2018-05-20: 40 meq via ORAL
  Filled 2018-05-20: qty 2

## 2018-05-20 MED ORDER — SODIUM CHLORIDE 0.9 % IV SOLN
2.0000 g | INTRAVENOUS | Status: DC
  Administered 2018-05-20: 2 g via INTRAVENOUS
  Filled 2018-05-20 (×2): qty 20

## 2018-05-20 NOTE — Progress Notes (Signed)
Patient Demographics:    Brittany Tran, is a 57 y.o. female, DOB - [birth year], YBW:[id number]  Admit date - 05/18/2018   Admitting Physician Norval Morton, MD  Outpatient Primary MD for the patient is Patient, No Pcp Per  LOS - 1   Chief Complaint  Patient presents with  . Ankle Pain        Subjective:    [name] today has no fevers, no emesis,  No chest pain, patient has significant rash and pruritus from infusion of IV vancomycin,   Assessment  & Plan :    Principal Problem:   Cellulitis Active Problems:   Rheumatoid arthritis ([facility])   Hypertension   Diabetes mellitus without complication ([place])   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[facility])  Brief Summary 57 y.o. Female with medical history significant of HTN, rheumatoid arthritison prednisone as well as Orencia, venous stasis ulcers, and DVT on chronic anticoagulation    Plan:-  1)Serratia Marcescens Cellulitis of Leg--- stop IV vancomycin, discussed with infectious disease specialist Dr. Linus Salmons, so discussed with pharmacist Ms. Clark, patient is allergic to penicillins and sulfa treat empirically with IV Rocephin 2 g every 24 hours pending final sensitivity of the wound culture from 05/18/2018... Patient is immunocompromised due to treatment of rheumatoid arthritis with immunosuppression/including prednisone and Orencia....  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 hydrOXYzine  25 mg Oral TID  . insulin aspart  0-15 Units Subcutaneous TID WC  . insulin aspart  0-5 Units Subcutaneous QHS  . oxyCODONE-acetaminophen  2 tablet Oral QHS  . predniSONE  2 mg Oral Q breakfast  . [START ON 05/22/2018] warfarin  2.5 mg Oral Once per day on Mon Fri  . warfarin  5 mg Oral Once per day on Sun Tue Wed Thu Sat  . Warfarin - Physician Dosing Inpatient   Does not apply q1800   Continuous Infusions: . sodium chloride 250 mL (05/20/18 1122)  . cefTRIAXone (ROCEPHIN)  IV     PRN Meds:.sodium chloride, acetaminophen, diphenhydrAMINE, hydrALAZINE, ondansetron (ZOFRAN) IV, oxyCODONE-acetaminophen    Anti-infectives (From admission, onward)   Start     Dose/Rate Route Frequency Ordered Stop   05/20/18 1845  cefTRIAXone (ROCEPHIN) 2 g in sodium chloride 0.9 % 100 mL IVPB     2 g 200 mL/hr over 30 Minutes Intravenous Every 24 hours 05/20/18 1737     05/20/18 0000  vancomycin (VANCOCIN)  IVPB 1000 mg/200 mL premix  Status:  Discontinued     1,000 mg 200 mL/hr over 60 Minutes Intravenous Every 12 hours 05/19/18 1049 05/20/18 1737   05/19/18 1045  vancomycin (VANCOCIN) 2,500 mg in sodium chloride 0.9 % 500 mL IVPB  Status:  Discontinued     2,500 mg 250 mL/hr over 120 Minutes Intravenous  Once 05/19/18 1038 05/20/18 1737   05/19/18 0730  clindamycin (CLEOCIN) IVPB 600 mg  Status:  Discontinued     600 mg 100 mL/hr over 30 Minutes Intravenous Every 8 hours 05/19/18 0401 05/19/18 0950   05/18/18 2330  clindamycin (CLEOCIN)  IVPB 600 mg     600 mg 100 mL/hr over 30 Minutes Intravenous  Once 05/18/18 2318 05/19/18 0058        Objective:   Vitals:   05/19/18 0326 05/19/18 1339 05/19/18 2127 05/20/18 1357  BP: (!) 156/90 125/71 117/67 112/61  Pulse: 98 92 94 90  Resp: 19 19 19 18   Temp: 98.8 F (37.1 C) 97.7 F (36.5 C) 99 F (37.2 C) 98.8 F (37.1 C)  TempSrc: Oral Oral Oral Oral  SpO2: 96% 96% 97% 95%  Weight:      Height:        Wt Readings from Last 3 Encounters:  05/18/18 134.7 kg  03/07/18 136.1 kg  01/31/18 136.1 kg     Intake/Output Summary (Last 24 hours) at 05/20/2018 1741 Last data filed at 05/20/2018 0014 Gross per 24 hour  Intake 610.51 ml  Output -  Net 610.51 ml     Physical Exam Patient is examined daily including today on 05/20/18 , exams remain the same as of yesterday except that has changed   Gen:- Awake Alert,  In no apparent distress  HEENT:- [place].AT, No sclera icterus Neck-Supple Neck,No JVD,.  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 infusion, continue antihistamines, stop vancomycin  Data Review:   Micro Results Recent Results (from the past 240 hour(s))  Wound or Superficial Culture     Status: None (Preliminary result)   Collection Time: 05/18/18 11:43 PM  Result Value Ref Range Status   Specimen Description   Final    WOUND Performed at [facility], [address] [place], [place] [address]    Special Requests   Final    Immunocompromised Performed at [facility],  [address] [place], [place] [address]    Gram Stain   Final    NO WBC SEEN NO ORGANISMS SEEN Performed at [facility], [place] [address], [place],  [address]    Culture FEW SERRATIA MARCESCENS  Final   Report Status PENDING  Incomplete    Radiology Reports Dg Tibia/fibula Left  Result Date: 05/18/2018 CLINICAL DATA:  Drainage from leg EXAM: LEFT TIBIA AND FIBULA - 2 VIEW COMPARISON:  None. FINDINGS: Degenerative changes in the left knee and ankle joints. No acute bony abnormality. Specifically, no fracture, subluxation, or dislocation. Diffuse soft tissue calcifications. IMPRESSION: No acute bony abnormality. Electronically Signed   By: Lennette Bihari  [place] M.D.   On: 05/18/2018 22:14   Ct Extremity Lower Left W Contrast  Result Date: 05/19/2018 CLINICAL DATA:  Open wound and drainage in the lateral left lower leg. EXAM: CT OF THE LOWER LEFT EXTREMITY WITH CONTRAST TECHNIQUE: Multidetector CT imaging of the lower left extremity was performed according to the standard protocol following intravenous contrast administration. COMPARISON:  Plain films earlier today. CONTRAST:  180mL ISOVUE-300 IOPAMIDOL (ISOVUE-300) INJECTION 61% FINDINGS: No acute bony abnormality. No fracture. Degenerative changes in the left knee and ankle. No destructive bony abnormality. Extensive varicosities noted in the left lower extremity, most pronounced medially in the distal thigh and proximal calf. Subcutaneous calcifications compatible with chronic venous stasis. No focal fluid collection. IMPRESSION: Evidence of extensive chronic venous stasis disease. No focal fluid collection. No acute bony abnormality. Electronically Signed   By: Rolm Baptise M.D.   On: 05/19/2018 00:52     CBC Recent Labs  Lab 05/18/18 2250 05/20/18 0249  WBC 8.6 7.6  HGB 12.9 12.8  HCT 41.4 41.7  PLT 279 265  MCV 99.0 97.7  MCH 30.9 30.0  MCHC 31.2 30.7  RDW 14.7 14.7  LYMPHSABS 2.1  --   MONOABS 0.5  --   EOSABS 0.1  --    BASOSABS 0.0  --     Chemistries  Recent Labs  Lab 05/18/18 2250 05/20/18 0249  NA 139 139  K 3.5 3.4*  CL 104 102  CO2 28 27  GLUCOSE 156* 157*  BUN 8 5*  CREATININE 0.60 0.71  CALCIUM 8.4* 8.1*   ------------------------------------------------------------------------------------------------------------------ No results for input(s): CHOL, HDL, LDLCALC, TRIG, CHOLHDL, LDLDIRECT in the last 72 hours.  Lab Results  Component Value Date   HGBA1C 7.8 (H) 05/19/2018   ------------------------------------------------------------------------------------------------------------------ No results for input(s): TSH, T4TOTAL, T3FREE, THYROIDAB in the last 72 hours.  Invalid input(s): FREET3 ------------------------------------------------------------------------------------------------------------------ No results for input(s): VITAMINB12, FOLATE, FERRITIN, TIBC, IRON, RETICCTPCT in the last 72 hours.  Coagulation profile Recent Labs  Lab 05/18/18 2250 05/20/18 0249  INR 2.14 2.09    No results for input(s): DDIMER in the last 72 hours.  Cardiac Enzymes No results for input(s): CKMB, TROPONINI, MYOGLOBIN in the last 168 hours.  Invalid input(s): CK ------------------------------------------------------------------------------------------------------------------ No results found for: BNP   Roxan Hockey M.D on 05/20/2018 at 5:41 PM  Pager---[phone] Go to [email or web address] - password [email or web address] for contact info  [facility] - Office  [phone]

## 2018-05-20 NOTE — Progress Notes (Signed)
Pt. Refused IV to place a midline for IV antibiotic therapy, MD notified.

## 2018-05-20 NOTE — Progress Notes (Signed)
   I was notified that patient is refusing IV/midline access to allow for antibiotic initiation....  Called and spoke with patient and RN at bedside explained rationale for IV antibiotics, case discussed with pharmacist patient wants to know all the possible side effects of IV Rocephin... Pharmacist Helene Kelp and route will give patient printout from [employer] of possible side effects of IV Rocephin  Patient advised to make a decision 1 way or the other as to whether she will accept IV antibiotics for her Serratia marcescens leg cellulitis.... Although she wants to go home [place]   Patient states that she needs more time to make a decision   At this time no antibiotics have been administered as patient is refusing to allow PICC line/IV team to place midline  Patient had IV vancomycin early in the morning but she did not finish it due to "red man" syndrome.... Vancomycin was discontinued anyway when the wound culture came back with Serratia marcescens  Awaiting for patient to make decision as to whether she will allow IV/PICC team to place IV access to allow for IV Rocephin, please see full progress note dictated earlier in the day

## 2018-05-21 LAB — AEROBIC CULTURE W GRAM STAIN (SUPERFICIAL SPECIMEN): Gram Stain: NONE SEEN

## 2018-05-21 LAB — PROTIME-INR
INR: 2.47
Prothrombin Time: 26.4 seconds — ABNORMAL HIGH (ref 11.4–15.2)

## 2018-05-21 LAB — GLUCOSE, CAPILLARY
Glucose-Capillary: 93 mg/dL (ref 70–99)
Glucose-Capillary: 123 mg/dL — ABNORMAL HIGH (ref 70–99)

## 2018-05-21 LAB — AEROBIC CULTURE  (SUPERFICIAL SPECIMEN)

## 2018-05-21 MED ORDER — SODIUM CHLORIDE 0.9 % IV SOLN
2.0000 g | Freq: Once | INTRAVENOUS | Status: AC
  Administered 2018-05-21: 2 g via INTRAVENOUS
  Filled 2018-05-21: qty 20

## 2018-05-21 MED ORDER — ACETAMINOPHEN 325 MG PO TABS: 650.0000 mg | ORAL_TABLET | Freq: Four times a day (QID) | ORAL | 0 refills | Status: AC | PRN

## 2018-05-21 MED ORDER — CEFDINIR 300 MG PO CAPS: 300.0000 mg | ORAL_CAPSULE | Freq: Two times a day (BID) | ORAL | 0 refills | Status: AC

## 2018-05-21 MED ORDER — LACTINEX PO CHEW: 1.0000 | CHEWABLE_TABLET | Freq: Three times a day (TID) | ORAL | 0 refills | Status: DC

## 2018-05-21 NOTE — Care Management Note (Signed)
Case Management Note  Patient Details  Name: Kamara Allan MRN: [id number] Date of Birth: Feb 11, 1961  Subjective/Objective:                    Action/Plan: Patient stated she has no insurance.   Provided [facility] letter and explained.    Patient needs PCP. Scheduled follow up appointment at [facility] for June 08, 2018 at 1030 am . Provided information to patient . Explained importance of having PCP . Patient voiced understanding.  Expected Discharge Date:  05/21/18               Expected Discharge Plan:  Home/Self Care  In-House Referral:  Financial Counselor  Discharge planning Services  CM Consult, [facility], Medication Assistance, [facility]  Post Acute Care Choice:  NA Choice offered to:  Patient  DME Arranged:  N/A DME Agency:  NA  HH Arranged:  NA HH Agency:  NA  Status of Service:  Completed, signed off  If discussed at [place] of Stay Meetings, dates discussed:    Additional Comments:  Marilu Favre, RN 05/21/2018, 4:58 PM
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
## 2018-05-21 NOTE — Discharge Instructions (Signed)
1) follow-up with a primary care physician on Monday, 05/25/2018 for repeat INR and for wound check 2) you are taking Coumadin which is a blood thinner so Avoid ibuprofen/Advil/Aleve/Motrin/Goody Powders/Naproxen/BC powders/Meloxicam/Diclofenac/Indomethacin and other Nonsteroidal anti-inflammatory medications as these will make you more likely to bleed and can cause stomach ulcers, can also cause Kidney problems.  3) repeat CBC and BMP blood test with a primary care evaluation on Monday, 05/25/2018

## 2018-05-21 NOTE — Progress Notes (Signed)
Pt for discharge going home, discontinued peripheral IV line, wound site dry and intact with dressing, given rocephin 2nd dose no allergic reaction noted, given health teachings, next appointment, due med explained and understood, also given the prescription, pt alert and oriented, ambulatory , no complain of pain at this time.

## 2018-05-21 NOTE — Progress Notes (Signed)
Pt changed the wound dressing and given percocet prior to discharge.

## 2018-06-08 ENCOUNTER — Inpatient Hospital Stay: Payer: Self-pay | Admitting: Family Medicine

## 2018-06-22 ENCOUNTER — Other Ambulatory Visit: Payer: Self-pay | Admitting: Pharmacist

## 2018-06-22 ENCOUNTER — Ambulatory Visit: Payer: Self-pay | Attending: Family Medicine | Admitting: Critical Care Medicine

## 2018-06-22 ENCOUNTER — Encounter: Payer: Self-pay | Admitting: Critical Care Medicine

## 2018-06-22 VITALS — BP 131/87 | HR 81 | Temp 98.6°F | Resp 18 | Ht 65.0 in | Wt 294.0 lb

## 2018-06-22 DIAGNOSIS — L0390 Cellulitis, unspecified: Secondary | ICD-10-CM

## 2018-06-22 DIAGNOSIS — Z7952 Long term (current) use of systemic steroids: Secondary | ICD-10-CM | POA: Insufficient documentation

## 2018-06-22 DIAGNOSIS — T380X5A Adverse effect of glucocorticoids and synthetic analogues, initial encounter: Secondary | ICD-10-CM | POA: Insufficient documentation

## 2018-06-22 DIAGNOSIS — M797 Fibromyalgia: Secondary | ICD-10-CM | POA: Insufficient documentation

## 2018-06-22 DIAGNOSIS — Z6841 Body Mass Index (BMI) 40.0 and over, adult: Secondary | ICD-10-CM

## 2018-06-22 DIAGNOSIS — L03116 Cellulitis of left lower limb: Secondary | ICD-10-CM | POA: Insufficient documentation

## 2018-06-22 DIAGNOSIS — I10 Essential (primary) hypertension: Secondary | ICD-10-CM | POA: Insufficient documentation

## 2018-06-22 DIAGNOSIS — L97222 Non-pressure chronic ulcer of left calf with fat layer exposed: Secondary | ICD-10-CM | POA: Insufficient documentation

## 2018-06-22 DIAGNOSIS — A419 Sepsis, unspecified organism: Secondary | ICD-10-CM | POA: Insufficient documentation

## 2018-06-22 DIAGNOSIS — E119 Type 2 diabetes mellitus without complications: Secondary | ICD-10-CM | POA: Insufficient documentation

## 2018-06-22 DIAGNOSIS — E669 Obesity, unspecified: Secondary | ICD-10-CM

## 2018-06-22 DIAGNOSIS — I878 Other specified disorders of veins: Secondary | ICD-10-CM | POA: Insufficient documentation

## 2018-06-22 DIAGNOSIS — I83893 Varicose veins of bilateral lower extremities with other complications: Secondary | ICD-10-CM | POA: Insufficient documentation

## 2018-06-22 DIAGNOSIS — I872 Venous insufficiency (chronic) (peripheral): Secondary | ICD-10-CM | POA: Insufficient documentation

## 2018-06-22 DIAGNOSIS — E876 Hypokalemia: Secondary | ICD-10-CM | POA: Insufficient documentation

## 2018-06-22 DIAGNOSIS — I83022 Varicose veins of left lower extremity with ulcer of calf: Secondary | ICD-10-CM | POA: Insufficient documentation

## 2018-06-22 DIAGNOSIS — Z86718 Personal history of other venous thrombosis and embolism: Secondary | ICD-10-CM | POA: Insufficient documentation

## 2018-06-22 DIAGNOSIS — Z79899 Other long term (current) drug therapy: Secondary | ICD-10-CM | POA: Insufficient documentation

## 2018-06-22 DIAGNOSIS — M069 Rheumatoid arthritis, unspecified: Secondary | ICD-10-CM | POA: Insufficient documentation

## 2018-06-22 DIAGNOSIS — I82409 Acute embolism and thrombosis of unspecified deep veins of unspecified lower extremity: Secondary | ICD-10-CM

## 2018-06-22 DIAGNOSIS — Z7901 Long term (current) use of anticoagulants: Secondary | ICD-10-CM | POA: Insufficient documentation

## 2018-06-22 LAB — POCT INR: INR: 2.1 (ref 2.0–3.0)

## 2018-06-22 MED ORDER — TRUEPLUS LANCETS 28G MISC: 11 refills | Status: AC

## 2018-06-22 MED ORDER — GLUCOSE BLOOD VI STRP: ORAL_STRIP | 12 refills | Status: AC

## 2018-06-22 MED ORDER — TRUE METRIX AIR GLUCOSE METER DEVI: 1.0000 | Freq: Three times a day (TID) | 0 refills | Status: DC

## 2018-06-22 MED ORDER — TRUEPLUS LANCETS 26G MISC: 6 refills | Status: DC

## 2018-06-22 NOTE — Assessment & Plan Note (Signed)
Long-term use of anticoagulant warfarin with adequate INR at this time  No evidence of recurrent deep venous thrombosis

## 2018-06-22 NOTE — Patient Instructions (Addendum)
A referral back to the wound care center was made  No change in your current medications are recommended  Your INR was 2.1 , stay on same warfarin dose   Please contact your primary care provider for follow-up appointment  Continue your follow-up with rheumatology

## 2018-06-22 NOTE — Progress Notes (Signed)
Subjective:    Patient ID: Brittany Tran, female    DOB: [birth year], 58 y.o.   MRN: [id number]  57 y.o.F   who was just discharged December fifth 2019 after being admitted on May 18, 2018 for left lower extremity cellulitis and abscess.  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  The patient also had a prior history of recurrent deep venous thrombosis and has been managed on oral warfarin.  During the hospitalization the warfarin dose was adjusted.  The INR was therapeutic at discharge at 2.47.  No recurrence of deep venous thrombosis was discerned.  Note there been a previous venous Doppler ultrasound in September 2019 showing no deep venous thrombosis.  While the patient is been presenting to [facility] emergency room and comes to our transition clinic today for post hospital care she states that her primary care physicians are in the [facility] system.  She also has wound care in the [facility] system in [place].  She also states she sees Dr. Gavin Pound of rheumatology and wishes to desire to finish following up with her for her rheumatology.  She had been on her on Orencia for rheumatoid arthritis but this is been held.  She is maintaining on low-dose prednisone.  She was due to come in to have her INR checked very soon after discharge to the hospital but this has not yet been performed.  She has not also not yet been back to the wound care center.  During the hospitalization a CT scan of the lower extremity was performed and did not show bony involvement with infection  Serratia marcescens was the organism and was adequately treated with IV antibiotics.  She is currently off all antibiotics.  She was noted to have a hemoglobin A1c of 7.8 during the last hospitalization.  She has not been on any diabetic  medications.  She takes oral over-the-counter supplements for prediabetes.  She is on chronic prednisone.  Her sugars do run in the high 100s low 200 range.  She does not have a meter to check her blood sugars.       Past Medical History:  Diagnosis Date  . Cellulitis   . Diabetes mellitus without complication ([place])   . Fibromyalgia   . History of DVT (deep vein thrombosis)   . Hypertension   . Rheumatoid arthritis ([place])      Family History  Problem Relation Age of Onset  . Diabetes Mother      Social History   Socioeconomic History  . Marital status: Single    Spouse name: Not on file  . Number of children: Not on file  . Years of education: Not on file  . Highest education level: Not on file  Occupational History  . Occupation: unemployed  Social Needs  . Financial resource strain: Not on file  . Food insecurity:    Worry: Not on file    Inability: Not on file  . Transportation needs:    Medical: Not on file    Non-medical: Not on file  Tobacco Use  . Smoking status: Never Smoker  . Smokeless tobacco: Never Used  Substance and Sexual Activity  . Alcohol use: No  . Drug use: No  . Sexual activity: Not on file  Lifestyle  . Physical activity:    Days per week:  Not on file    Minutes per session: Not on file  . Stress: Not on file  Relationships  . Social connections:    Talks on phone: Not on file    Gets together: Not on file    Attends religious service: Not on file    Active member of club or organization: Not on file    Attends meetings of clubs or organizations: Not on file    Relationship status: Not on file  . Intimate partner violence:    Fear of current or ex partner: Not on file    Emotionally abused: Not on file    Physically abused: Not on file    Forced sexual activity: Not on file  Other Topics Concern  . Not on file  Social History Narrative  . Not on file     Allergies  Allergen Reactions  . Penicillins Hives    Has patient had a  PCN reaction causing immediate rash, facial/tongue/throat swelling, SOB or lightheadedness with hypotension: Yes Has patient had a PCN reaction causing severe rash involving mucus membranes or skin necrosis: No Has patient had a PCN reaction that required hospitalization: No Has patient had a PCN reaction occurring within the last 10 years: No If all of the above answers are "NO", then may proceed with Cephalosporin use.   . Vancomycin Rash    Developed a red rash bilaterally on her arms, legs, neck, chest and abdomen  . Sulfa Antibiotics Hives     Outpatient Medications Prior to Visit  Medication Sig Dispense Refill  . acetaminophen (TYLENOL) 325 MG tablet Take 2 tablets (650 mg total) by mouth every 6 (six) hours as needed for mild pain or fever. 12 tablet 0  . gabapentin (NEURONTIN) 100 MG capsule Take 300 mg by mouth at bedtime.  0  . glucosamine-chondroitin 500-400 MG tablet Take 1 tablet by mouth 2 (two) times daily.    Marland Kitchen oxyCODONE-acetaminophen (PERCOCET/ROXICET) 5-325 MG tablet Take 1-2 tablets by mouth every 6 (six) hours as needed for moderate pain.   0  . predniSONE (DELTASONE) 1 MG tablet Take 2 mg by mouth daily with breakfast.     . triamcinolone (KENALOG) 0.025 % ointment Apply 1 application topically See admin instructions. Apply to periwound with dressing changes.  2  . warfarin (COUMADIN) 5 MG tablet Take 1 tablet (5 mg total) by mouth See admin instructions. Take 2.5 mg by mouth every other day alternating with 5 mg by mouth every other day (Patient taking differently: Take 2.5-5 mg by mouth See admin instructions. 2.5mg  on MON and FRI, 5mg  all other days) 30 tablet 0  . cyclobenzaprine (FLEXERIL) 10 MG tablet Take 1 tablet (10 mg total) by mouth 2 (two) times daily as needed for muscle spasms. (Patient not taking: Reported on 05/19/2018) 20 tablet 0  . furosemide (LASIX) 20 MG tablet Take 1 tablet (20 mg total) by mouth daily. (Patient not taking: Reported on 05/19/2018) 5  tablet 0  . lactobacillus acidophilus & bulgar (LACTINEX) chewable tablet Chew 1 tablet by mouth 3 (three) times daily with meals. (Patient not taking: Reported on 06/22/2018) 30 tablet 0   No facility-administered medications prior to visit.      Review of Systems  Constitutional: Negative for chills and fever.  HENT: Negative.   Respiratory: Negative for cough, choking, chest tightness, shortness of breath, wheezing and stridor.   Cardiovascular: Positive for leg swelling. Negative for chest pain.  Gastrointestinal: Negative.   Genitourinary: Negative.  Musculoskeletal: Negative.   Skin: Positive for rash and wound.  Neurological: Negative.   Hematological: Bruises/bleeds easily.  Psychiatric/Behavioral: Negative.        Objective:   Physical Exam Vitals:   06/22/18 1147  BP: 131/87  Pulse: 81  Resp: 18  Temp: 98.6 F (37 C)  TempSrc: Oral  SpO2: 97%  Weight: 294 lb (133.4 kg)  Height: 5\' 5"  (1.651 m)    Gen: Pleasant, obese,  in no distress,  normal affect  ENT: No lesions,  mouth clear,  oropharynx clear, no postnasal drip  Neck: No JVD, no TMG, no carotid bruits  Lungs: No use of accessory muscles, no dullness to percussion, clear without rales or rhonchi  Cardiovascular: RRR, heart sounds normal, no murmur or gallops, no peripheral edema  Abdomen: soft and NT, no HSM,  BS normal  Musculoskeletal: No deformities, no cyanosis or clubbing  Neuro: alert, non focal  Skin: Warm, chronic venous stasis discoloration of both lower extremities, in the left lower extremity there is a small 1 x 2 cm ulceration in the upper medial aspect of the calf and a 2 x 2 centimeter ulceration in the left lower lateral calf There does not appear to be crepitance or evidence of cellulitis in the left lower extremity.  The right lower extremity does not have any ulcerations.  BMP Latest Ref Rng & Units 05/20/2018 05/18/2018 03/07/2018  Glucose 70 - 99 mg/dL 157(H) 156(H) 243(H)  BUN  6 - 20 mg/dL 5(L) 8 12  Creatinine 0.44 - 1.00 mg/dL 0.71 0.60 0.81  Sodium 135 - 145 mmol/L 139 139 137  Potassium 3.5 - 5.1 mmol/L 3.4(L) 3.5 3.5  Chloride 98 - 111 mmol/L 102 104 98  CO2 22 - 32 mmol/L 27 28 28   Calcium 8.9 - 10.3 mg/dL 8.1(L) 8.4(L) 8.1(L)   CBC Latest Ref Rng & Units 05/20/2018 05/18/2018 03/07/2018  WBC 4.0 - 10.5 K/uL 7.6 8.6 10.2  Hemoglobin 12.0 - 15.0 g/dL 12.8 12.9 13.4  Hematocrit 36.0 - 46.0 % 41.7 41.4 39.9  Platelets 150 - 400 K/uL 265 279 358   Lab Results  Component Value Date   INR 2.1 06/22/2018   INR 2.47 05/21/2018   INR 2.09 05/20/2018   INR 06/22/2018:  2.1   CT Lower extremity 05/19/18: IMPRESSION: Evidence of extensive chronic venous stasis disease.  No focal fluid collection.  No acute bony abnormality.     Assessment & Plan:  I personally reviewed all images and lab data in the [facility] system as well as any outside material available during this office visit and agree with the  radiology impressions.   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  Hypertension Hypertension with adequate control, and no need for medication therapy at this time  Diabetes mellitus without complication (HCC) Hemoglobin A1c of 7.8 and poor glycemic control secondary to chronic steroid use  Plan  The patient declined to receive a metformin prescription at this visit The patient indicates she has a primary care physician we will follow-up with the primary care physician on her diabetes status The patient did agree to receive a glucometer with testing strips  Sepsis due to cellulitis ([place]) Sepsis  secondary  to cellulitis resolved at this time  Cellulitis does not show evidence of activity at this time and no additional antibiotics are indicated  Hypokalemia Hypokalemia has resolved  Cellulitis of left lower extremity Cellulitis of left lower extremity  Continue monitoring no cellulitis currently in active  No additional antibiotics indicated  Long term (current) use of anticoagulants Long-term use of anticoagulant warfarin with adequate INR at this time  No evidence of recurrent deep venous thrombosis  Venous stasis ulcer of left calf with fat layer exposed with varicose veins (HCC) Multiple left venous stasis ulcers of the calf with fat layer is exposed and varicose veins  Referral back to wound care clinic  will be made   Radiah was seen today for hospitalization follow-up.  Diagnoses and all orders for this visit:  Venous stasis ulcer of left calf with fat layer exposed with varicose veins (HCC) -     AMB referral to wound care center  Cellulitis of left lower extremity  Varicose veins of bilateral lower extremities with other complications -     INR  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[place])  Essential hypertension  Diabetes mellitus without complication ([place])  Sepsis due to cellulitis ([place])  Hypokalemia  Long term (current) use of anticoagulants  Other orders -     glucose blood (TRUE METRIX BLOOD GLUCOSE TEST) test strip; Use as instructed -     Blood Glucose Monitoring Suppl (TRUE METRIX AIR GLUCOSE METER) DEVI; 1 strip by Does not apply route 3 (three) times daily before meals. -     TRUEPLUS LANCETS 26G MISC; Test glucose with lancet and strip three times daily a meals

## 2018-06-22 NOTE — Assessment & Plan Note (Signed)
History of deep venous thrombosis without evidence of current recurrence Note patient is on long-term warfarin anticoagulation and INR is therapeutic at this time  The patient will continue warfarin at current medication dosage regimen

## 2018-06-22 NOTE — Assessment & Plan Note (Signed)
Hypertension with adequate control, and no need for medication therapy at this time

## 2018-06-22 NOTE — Assessment & Plan Note (Addendum)
Cellulitis of left lower extremity  Continue monitoring no cellulitis currently in active  No additional antibiotics indicated

## 2018-06-22 NOTE — Assessment & Plan Note (Signed)
Hemoglobin A1c of 7.8 and poor glycemic control secondary to chronic steroid use  Plan  The patient declined to receive a metformin prescription at this visit The patient indicates she has a primary care physician we will follow-up with the primary care physician on her diabetes status The patient did agree to receive a glucometer with testing strips

## 2018-06-22 NOTE — Assessment & Plan Note (Signed)
Sepsis secondary to cellulitis resolved at this time  Cellulitis does not show evidence of activity at this time and no additional antibiotics are indicated

## 2018-06-22 NOTE — Assessment & Plan Note (Signed)
Chronic varicose veins with venous stasis dermatitis

## 2018-06-22 NOTE — Assessment & Plan Note (Signed)
Multiple left venous stasis ulcers of the calf with fat layer is exposed and varicose veins  Referral back to wound care clinic  will be made

## 2018-06-22 NOTE — Assessment & Plan Note (Signed)
Hypokalemia has resolved

## 2018-07-16 ENCOUNTER — Encounter (HOSPITAL_BASED_OUTPATIENT_CLINIC_OR_DEPARTMENT_OTHER): Payer: Self-pay

## 2018-07-16 ENCOUNTER — Emergency Department (HOSPITAL_BASED_OUTPATIENT_CLINIC_OR_DEPARTMENT_OTHER)
Admission: EM | Admit: 2018-07-16 | Discharge: 2018-07-16 | Disposition: A | Discharge status: Home / Self Care | Payer: Self-pay | Attending: Emergency Medicine | Admitting: Emergency Medicine

## 2018-07-16 ENCOUNTER — Other Ambulatory Visit: Payer: Self-pay

## 2018-07-16 DIAGNOSIS — E119 Type 2 diabetes mellitus without complications: Secondary | ICD-10-CM | POA: Insufficient documentation

## 2018-07-16 DIAGNOSIS — Z7901 Long term (current) use of anticoagulants: Secondary | ICD-10-CM | POA: Insufficient documentation

## 2018-07-16 DIAGNOSIS — I10 Essential (primary) hypertension: Secondary | ICD-10-CM | POA: Insufficient documentation

## 2018-07-16 DIAGNOSIS — L03116 Cellulitis of left lower limb: Secondary | ICD-10-CM | POA: Insufficient documentation

## 2018-07-16 LAB — BASIC METABOLIC PANEL
Anion gap: 8 (ref 5–15)
BUN: 18 mg/dL (ref 6–20)
CO2: 25 mmol/L (ref 22–32)
CREATININE: 0.8 mg/dL (ref 0.44–1.00)
Calcium: 8.3 mg/dL — ABNORMAL LOW (ref 8.9–10.3)
Chloride: 102 mmol/L (ref 98–111)
GFR calc Af Amer: >60 mL/min (ref >60)
GFR calc non Af Amer: >60 mL/min (ref >60)
Glucose, Bld: 159 mg/dL — ABNORMAL HIGH (ref 70–99)
Potassium: 3.3 mmol/L — ABNORMAL LOW (ref 3.5–5.1)
Sodium: 135 mmol/L (ref 135–145)

## 2018-07-16 LAB — CBC WITH DIFFERENTIAL/PLATELET
Abs Immature Granulocytes: 0.06 10*3/uL (ref 0.00–0.07)
Basophils Absolute: 0 10*3/uL (ref 0.0–0.1)
Basophils Relative: 0 %
Eosinophils Absolute: 0 10*3/uL (ref 0.0–0.5)
Eosinophils Relative: 0 %
HCT: 41 % (ref 36.0–46.0)
Hemoglobin: 12.8 g/dL (ref 12.0–15.0)
Immature Granulocytes: 0 %
LYMPHS PCT: 11 %
Lymphs Abs: 1.8 10*3/uL (ref 0.7–4.0)
MCH: 30.6 pg (ref 26.0–34.0)
MCHC: 31.2 g/dL (ref 30.0–36.0)
MCV: 98.1 fL (ref 80.0–100.0)
Monocytes Absolute: 0.6 10*3/uL (ref 0.1–1.0)
Monocytes Relative: 4 %
Neutro Abs: 13.2 10*3/uL — ABNORMAL HIGH (ref 1.7–7.7)
Neutrophils Relative %: 85 %
Platelets: 179 10*3/uL (ref 150–400)
RBC: 4.18 MIL/uL (ref 3.87–5.11)
RDW: 14.9 % (ref 11.5–15.5)
WBC: 15.8 10*3/uL — ABNORMAL HIGH (ref 4.0–10.5)
nRBC: 0 % (ref 0.0–0.2)

## 2018-07-16 LAB — PROTIME-INR
INR: 1.78
Prothrombin Time: 20.4 seconds — ABNORMAL HIGH (ref 11.4–15.2)

## 2018-07-16 LAB — LACTIC ACID, PLASMA: Lactic Acid, Venous: 1.7 mmol/L (ref 0.5–1.9)

## 2018-07-16 MED ORDER — SODIUM CHLORIDE 0.9 % IV SOLN
INTRAVENOUS | Status: DC | PRN
  Administered 2018-07-16: 500 mL via INTRAVENOUS

## 2018-07-16 MED ORDER — SODIUM CHLORIDE 0.9 % IV BOLUS
1000.0000 mL | Freq: Once | INTRAVENOUS | Status: AC
  Administered 2018-07-16: 1000 mL via INTRAVENOUS

## 2018-07-16 MED ORDER — DOXYCYCLINE HYCLATE 100 MG PO TABS
100.0000 mg | ORAL_TABLET | Freq: Once | ORAL | Status: AC
  Administered 2018-07-16: 100 mg via ORAL
  Filled 2018-07-16: qty 1

## 2018-07-16 MED ORDER — ACETAMINOPHEN 500 MG PO TABS
1000.0000 mg | ORAL_TABLET | Freq: Once | ORAL | Status: AC
  Administered 2018-07-16: 1000 mg via ORAL
  Filled 2018-07-16: qty 2

## 2018-07-16 MED ORDER — DOXYCYCLINE HYCLATE 100 MG PO CAPS: 100.0000 mg | ORAL_CAPSULE | Freq: Two times a day (BID) | ORAL | 0 refills | Status: DC

## 2018-07-16 MED ORDER — SODIUM CHLORIDE 0.9 % IV SOLN
1.0000 g | Freq: Once | INTRAVENOUS | Status: AC
  Administered 2018-07-16: 1 g via INTRAVENOUS
  Filled 2018-07-16: qty 10

## 2018-07-16 MED ORDER — CEFDINIR 300 MG PO CAPS: 300.0000 mg | ORAL_CAPSULE | Freq: Two times a day (BID) | ORAL | 0 refills | Status: DC

## 2018-07-16 NOTE — Discharge Instructions (Signed)
Take omnicef and doxycycline for cellulitis.   We sent off blood cultures. If you are called for positive blood culture, then you will need to be admitted to the hospital.   See your doctor   Repeat INR in a week as the antibiotics will affect the INR   Return to ER if you have worse leg pain and swelling, worse leg redness, fever.

## 2018-07-16 NOTE — ED Triage Notes (Signed)
C/o left leg swelling x 5 days-worse x 2 days-denies injury-NAD-steady slow gait with cane to triage

## 2018-07-16 NOTE — ED Provider Notes (Signed)
[facility] Provider Note   CSN: [id number] Arrival date & time: 07/16/18  2141     History   Chief Complaint Chief Complaint  Patient presents with  . Leg Swelling    HPI Brittany Tran is a 58 y.o. female history of cellulitis, diabetes, DVT currently on Coumadin, here presenting with left leg pain.  Patient states that she follows up with wound clinic and last visited several days ago.  She states that she had the dressing changed and at that time everything was baseline.  States that for the last 3 to 4 days, she has increasing pain and swelling and redness in the left leg.  She denies any fevers or chills or vomiting.  She states that she was admitted to the hospital about a month ago for cellulitis.  She actually had a wound culture at that time and was diagnosed with Serratia.  She was eventually given Rocephin and was discharged home with [facility]  She also had a CT at that time showed no abscess. She denies any drainage from her wound.   The history is provided by the patient.    Past Medical History:  Diagnosis Date  . Cellulitis   . Diabetes mellitus without complication ([place])   . Fibromyalgia   . History of DVT (deep vein thrombosis)   . Hypertension   . Rheumatoid arthritis [facility])     Patient Active Problem List   Diagnosis Date Noted  . Venous stasis ulcer of left calf with fat layer exposed with varicose veins ([place]) 06/22/2018  . 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[place]) 05/19/2018  . Rheumatoid arthritis ([place])   . Hypertension   . Diabetes mellitus without complication ([place])   . Long term (current) use of anticoagulants 05/01/2017  . Varicose veins of bilateral lower extremities with other complications 40/98/1191  . Cellulitis of left lower extremity     Past Surgical History:  Procedure Laterality Date  . HARDWARE REMOVAL Left 09/29/2014   Procedure: REMOVAL OF DEEP IMPLANT OF LEFT FIBULA  AND TIBIA (SEPARATE INCISIONS);   Surgeon: Wylene Simmer, MD;  Location: [facility];  Service: Orthopedics;  Laterality: Left;  . ORIF ANKLE FRACTURE  1988   left     OB History   No obstetric history on file.      Home Medications    Prior to Admission medications   Medication Sig Start Date End Date Taking? Authorizing Provider  acetaminophen (TYLENOL) 325 MG tablet Take 2 tablets (650 mg total) by mouth every 6 (six) hours as needed for mild pain or fever. 05/21/18   Roxan Hockey, MD  Blood Glucose Monitoring Suppl (TRUE METRIX AIR GLUCOSE METER) DEVI 1 strip by Does not apply route 3 (three) times daily before meals. 06/22/18   Elsie Stain, MD  cyclobenzaprine (FLEXERIL) 10 MG tablet Take 1 tablet (10 mg total) by mouth 2 (two) times daily as needed for muscle spasms. Patient not taking: Reported on 05/19/2018 03/22/17   McDonald, Maree Erie A, PA-C  furosemide (LASIX) 20 MG tablet Take 1 tablet (20 mg total) by mouth daily. Patient not taking: Reported on 05/19/2018 02/18/18   Vanessa Kick, MD  gabapentin (NEURONTIN) 100 MG capsule Take 300 mg by mouth at bedtime. 05/05/18   [provider]  glucosamine-chondroitin 500-400 MG tablet Take 1 tablet by mouth 2 (two) times daily.    [provider]  glucose blood (TRUE METRIX BLOOD GLUCOSE TEST) test strip Use as instructed 06/22/18  Elsie Stain, MD  lactobacillus acidophilus & bulgar (LACTINEX) chewable tablet Chew 1 tablet by mouth 3 (three) times daily with meals. Patient not taking: Reported on 06/22/2018 05/21/18   Roxan Hockey, MD  oxyCODONE-acetaminophen (PERCOCET/ROXICET) 5-325 MG tablet Take 1-2 tablets by mouth every 6 (six) hours as needed for moderate pain.  03/26/17   [provider]  predniSONE (DELTASONE) 1 MG tablet Take 2 mg by mouth daily with breakfast.     [provider]  triamcinolone (KENALOG) 0.025 % ointment Apply 1 application topically See admin instructions. Apply to periwound with dressing  changes. 04/22/18   [provider]  TRUEPLUS LANCETS 28G MISC Use to check blood sugar daily. 06/22/18   Elsie Stain, MD  warfarin (COUMADIN) 5 MG tablet Take 1 tablet (5 mg total) by mouth See admin instructions. Take 2.5 mg by mouth every other day alternating with 5 mg by mouth every other day Patient taking differently: Take 2.5-5 mg by mouth See admin instructions. 2.5mg  on MON and FRI, 5mg  all other days 03/15/17   Velvet Bathe, MD    Family History Family History  Problem Relation Age of Onset  . Diabetes Mother     Social History Social History   Tobacco Use  . Smoking status: Never Smoker  . Smokeless tobacco: Never Used  Substance Use Topics  . Alcohol use: No  . Drug use: No     Allergies   Penicillins; Vancomycin; and Sulfa antibiotics   Review of Systems Review of Systems  Skin: Positive for color change and wound.  All other systems reviewed and are negative.    Physical Exam Updated Vital Signs BP (!) 154/74 (BP Location: Left Arm)   Pulse (!) 105   Temp (!) 100.7 F (38.2 C) (Oral)   Resp 20   Ht 5\' 5"  (1.651 m)   Wt 129.7 kg   SpO2 97%   BMI 47.59 kg/m   Physical Exam Vitals signs and nursing note reviewed.  HENT:     Head: Normocephalic.     Mouth/Throat:     Mouth: Mucous membranes are moist.  Eyes:     Extraocular Movements: Extraocular movements intact.     Pupils: Pupils are equal, round, and reactive to light.  Neck:     Musculoskeletal: Normal range of motion.  Cardiovascular:     Rate and Rhythm: Normal rate.     Pulses: Normal pulses.     Heart sounds: Normal heart sounds.  Pulmonary:     Effort: Pulmonary effort is normal.     Breath sounds: Normal breath sounds.  Abdominal:     General: Abdomen is flat.     Palpations: Abdomen is soft.  Musculoskeletal:     Comments: L lower leg wrapped (patient didn't want me to unwrap it). There is cellulitis extending to the L mid thigh. There is some tenderness of the  thigh. Good peripheral pulses   Skin:    General: Skin is warm.     Capillary Refill: Capillary refill takes less than 2 seconds.     Findings: Erythema present.  Neurological:     General: No focal deficit present.     Mental Status: She is alert and oriented to person, place, and time.  Psychiatric:        Mood and Affect: Mood normal.        Behavior: Behavior normal.      ED Treatments / Results  Labs (all labs ordered are listed, but  only abnormal results are displayed) Labs Reviewed  CBC WITH DIFFERENTIAL/PLATELET - Abnormal; Notable for the following components:      Result Value   WBC 15.8 (*)    Neutro Abs 13.2 (*)    All other components within normal limits  BASIC METABOLIC PANEL - Abnormal; Notable for the following components:   Potassium 3.3 (*)    Glucose, Bld 159 (*)    Calcium 8.3 (*)    All other components within normal limits  PROTIME-INR - Abnormal; Notable for the following components:   Prothrombin Time 20.4 (*)    All other components within normal limits  CULTURE, BLOOD (ROUTINE X 2)  CULTURE, BLOOD (ROUTINE X 2)  LACTIC ACID, PLASMA    EKG None  Radiology No results found.  Procedures Procedures (including critical care time)  Medications Ordered in ED Medications  sodium chloride 0.9 % bolus 1,000 mL (1,000 mLs Intravenous New Bag/Given 07/16/18 2225)  0.9 %  sodium chloride infusion (500 mLs Intravenous New Bag/Given 07/16/18 2233)  acetaminophen (TYLENOL) tablet 1,000 mg (has no administration in time range)  cefTRIAXone (ROCEPHIN) 1 g in sodium chloride 0.9 % 100 mL IVPB ( Intravenous Stopped 07/16/18 2305)  doxycycline (VIBRA-TABS) tablet 100 mg (100 mg Oral Given 07/16/18 2319)     Initial Impression / Assessment and Plan / ED Course  I have reviewed the triage vital signs and the nursing notes.  Pertinent labs & imaging results that were available during my care of the patient were reviewed by me and considered in my medical  decision making (see chart for details).    Josanna Hefel is a 58 y.o. female here with L leg cellulitis. Afebrile but patient tachycardic. She is on coumadin and has no chest pain or SOB to suggest PE. Also low suspicion for DVT given coumadin use. Likely recurrent cellulitis. Will get labs, lactate, cultures. Will give rocephin (recent serratia cellulitis) and doxycycline (for MRSA coverage).   11:22 PM WBC 15. Lactate normal. Had low grade temp 100.7 but HR improved. Given rocephin, doxycycline. Discussed admission for observation vs close follow up and patient wants to go home. Will dc home with omnicef (same as last admission) and doxycycline. Told her if her blood cultures became positive, she will need to return and be admitted. Also INR 1.8, will need to be repeated in a week given that the abx may affect her INR level.     Final Clinical Impressions(s) / ED Diagnoses   Final diagnoses:  None    ED Discharge Orders    None       Drenda Freeze, MD 07/16/18 2323

## 2018-07-21 ENCOUNTER — Other Ambulatory Visit: Payer: Self-pay | Admitting: Internal Medicine

## 2018-07-21 DIAGNOSIS — L03116 Cellulitis of left lower limb: Secondary | ICD-10-CM

## 2018-07-22 LAB — CULTURE, BLOOD (ROUTINE X 2)
Culture: NO GROWTH
Culture: NO GROWTH
Special Requests: ADEQUATE
Special Requests: ADEQUATE

## 2018-07-23 ENCOUNTER — Ambulatory Visit
Admission: RE | Admit: 2018-07-23 | Discharge: 2018-07-23 | Disposition: A | Discharge status: Home / Self Care | Payer: No Typology Code available for payment source | Source: Ambulatory Visit | Attending: Internal Medicine | Admitting: Internal Medicine

## 2018-07-23 DIAGNOSIS — L03116 Cellulitis of left lower limb: Secondary | ICD-10-CM

## 2018-07-23 MED ORDER — IOPAMIDOL (ISOVUE-300) INJECTION 61%
125.0000 mL | Freq: Once | INTRAVENOUS | Status: AC | PRN
  Administered 2018-07-23: 125 mL via INTRAVENOUS

## 2019-09-10 ENCOUNTER — Telehealth (HOSPITAL_COMMUNITY): Payer: Self-pay

## 2019-09-10 NOTE — Telephone Encounter (Signed)

## 2019-09-13 ENCOUNTER — Other Ambulatory Visit: Payer: Self-pay | Admitting: *Deleted

## 2019-09-13 DIAGNOSIS — I83893 Varicose veins of bilateral lower extremities with other complications: Secondary | ICD-10-CM

## 2019-09-13 DIAGNOSIS — I82409 Acute embolism and thrombosis of unspecified deep veins of unspecified lower extremity: Secondary | ICD-10-CM

## 2019-09-14 ENCOUNTER — Ambulatory Visit (HOSPITAL_COMMUNITY): Payer: BC Managed Care – PPO

## 2019-09-14 ENCOUNTER — Encounter: Payer: Self-pay | Admitting: Vascular Surgery

## 2019-11-01 ENCOUNTER — Telehealth (HOSPITAL_COMMUNITY): Payer: Self-pay

## 2019-11-01 NOTE — Telephone Encounter (Signed)

## 2019-11-02 ENCOUNTER — Other Ambulatory Visit: Payer: Self-pay

## 2019-11-02 ENCOUNTER — Encounter: Payer: Self-pay | Admitting: Vascular Surgery

## 2019-11-02 ENCOUNTER — Ambulatory Visit (INDEPENDENT_AMBULATORY_CARE_PROVIDER_SITE_OTHER): Payer: BC Managed Care – PPO | Admitting: Vascular Surgery

## 2019-11-02 ENCOUNTER — Ambulatory Visit (HOSPITAL_COMMUNITY)
Admission: RE | Admit: 2019-11-02 | Discharge: 2019-11-02 | Disposition: A | Discharge status: Home / Self Care | Payer: BC Managed Care – PPO | Source: Ambulatory Visit | Attending: Vascular Surgery | Admitting: Vascular Surgery

## 2019-11-02 DIAGNOSIS — I83893 Varicose veins of bilateral lower extremities with other complications: Secondary | ICD-10-CM | POA: Diagnosis not present

## 2019-11-02 DIAGNOSIS — I872 Venous insufficiency (chronic) (peripheral): Secondary | ICD-10-CM | POA: Insufficient documentation

## 2019-11-02 NOTE — Progress Notes (Signed)
Patient name: Brittany Tran MRN: [id number] DOB: [birth year] Sex: female  REASON FOR VISIT: Evaluate left lower extremity venous ulcerations and venous insufficiency  HPI: Brittany Tran is a 59 y.o. female with history of hypertension, rheumatoid arthritis, multiple left lower extremity DVTs that presents for evaluation of venous insufficiency in the left leg.  She is actually well-known to our practice and was initially seen in 2009 by Dr. Donnetta Hutching and recommended conservative management at that time when she had evidence of a femoral-popliteal DVT in the left leg with deep venous insufficiency and no superficial reflux.  She was seen again in approximately 2018 by Dr. Kellie Simmering who again recommended conservative measures given the complex nature of her left lower extremity DVTs.  On follow-up today states that she got some ulcerations on the medial and lateral aspect of her left ankle about March.  She states she has been going to wound clinic and it sounds like she is in [employer].  These are healing accordingly.  Unfortunately she has gained a lot of weight during Covid and is very limited mobility.  Remains on Coumadin at this time.  Thinks her DVTs in the past related to her rheumatoid arthritis.  Past Medical History:  Diagnosis Date  . Cellulitis   . Diabetes mellitus without complication ([place])   . Fibromyalgia   . History of DVT (deep vein thrombosis)   . Hypertension   . Rheumatoid arthritis [facility])     Past Surgical History:  Procedure Laterality Date  . HARDWARE REMOVAL Left 09/29/2014   Procedure: REMOVAL OF DEEP IMPLANT OF LEFT FIBULA  AND TIBIA (SEPARATE INCISIONS);  Surgeon: Wylene Simmer, MD;  Location: [place];  Service: Orthopedics;  Laterality: Left;  . ORIF ANKLE FRACTURE  1988   left    Family History  Problem Relation Age of Onset  . Diabetes Mother     SOCIAL HISTORY: Social History   Tobacco Use  . Smoking status: Never Smoker  . Smokeless tobacco:  Never Used  Substance Use Topics  . Alcohol use: No    Allergies  Allergen Reactions  . Penicillins Hives    Has patient had a PCN reaction causing immediate rash, facial/tongue/throat swelling, SOB or lightheadedness with hypotension: Yes Has patient had a PCN reaction causing severe rash involving mucus membranes or skin necrosis: No Has patient had a PCN reaction that required hospitalization: No Has patient had a PCN reaction occurring within the last 10 years: No If all of the above answers are "NO", then may proceed with Cephalosporin use.   . Vancomycin Rash    Developed a red rash bilaterally on her arms, legs, neck, chest and abdomen  . Sulfa Antibiotics Hives    Current Outpatient Medications  Medication Sig Dispense Refill  . acetaminophen (TYLENOL) 325 MG tablet Take 2 tablets (650 mg total) by mouth every 6 (six) hours as needed for mild pain or fever. 12 tablet 0  . Blood Glucose Monitoring Suppl (TRUE METRIX AIR GLUCOSE METER) DEVI 1 strip by Does not apply route 3 (three) times daily before meals. 1 Device 0  . cefdinir (OMNICEF) 300 MG capsule Take 1 capsule (300 mg total) by mouth 2 (two) times daily. 15 capsule 0  . cyclobenzaprine (FLEXERIL) 10 MG tablet Take 1 tablet (10 mg total) by mouth 2 (two) times daily as needed for muscle spasms. 20 tablet 0  . doxycycline (VIBRAMYCIN) 100 MG capsule Take 1 capsule (100 mg total) by mouth 2 (two) times  daily. One po bid x 7 days 14 capsule 0  . furosemide (LASIX) 20 MG tablet Take 1 tablet (20 mg total) by mouth daily. 5 tablet 0  . gabapentin (NEURONTIN) 100 MG capsule Take 300 mg by mouth at bedtime.  0  . glucosamine-chondroitin 500-400 MG tablet Take 1 tablet by mouth 2 (two) times daily.    Marland Kitchen glucose blood (TRUE METRIX BLOOD GLUCOSE TEST) test strip Use as instructed 100 each 12  . lactobacillus acidophilus & bulgar (LACTINEX) chewable tablet Chew 1 tablet by mouth 3 (three) times daily with meals. 30 tablet 0  .  oxyCODONE-acetaminophen (PERCOCET/ROXICET) 5-325 MG tablet Take 1-2 tablets by mouth every 6 (six) hours as needed for moderate pain.   0  . predniSONE (DELTASONE) 1 MG tablet Take 2 mg by mouth daily with breakfast.     . triamcinolone (KENALOG) 0.025 % ointment Apply 1 application topically See admin instructions. Apply to periwound with dressing changes.  2  . TRUEPLUS LANCETS 28G MISC Use to check blood sugar daily. 100 each 11  . warfarin (COUMADIN) 5 MG tablet Take 1 tablet (5 mg total) by mouth See admin instructions. Take 2.5 mg by mouth every other day alternating with 5 mg by mouth every other day (Patient taking differently: Take 2.5-5 mg by mouth See admin instructions. 2.5mg  on MON and FRI, 5mg  all other days) 30 tablet 0   No current facility-administered medications for this visit.    REVIEW OF SYSTEMS:  [X]  denotes positive finding, [ ]  denotes negative finding Cardiac  Comments:  Chest pain or chest pressure:    Shortness of breath upon exertion:    Short of breath when lying flat:    Irregular heart rhythm:        Vascular    Pain in calf, thigh, or hip brought on by ambulation:    Pain in feet at night that wakes you up from your sleep:     Blood clot in your veins:    Leg swelling:  x       Pulmonary    Oxygen at home:    Productive cough:     Wheezing:         Neurologic    Sudden weakness in arms or legs:     Sudden numbness in arms or legs:     Sudden onset of difficulty speaking or slurred speech:    Temporary loss of vision in one eye:     Problems with dizziness:         Gastrointestinal    Blood in stool:     Vomited blood:         Genitourinary    Burning when urinating:     Blood in urine:        Psychiatric    Major depression:         Hematologic    Bleeding problems:    Problems with blood clotting too easily:        Skin    Rashes or ulcers:        Constitutional    Fever or chills:      PHYSICAL EXAM: Vitals:   11/02/19 1352   BP: 126/84  Pulse: 96  Resp: 16  Temp: 98.1 F (36.7 C)  TempSrc: Temporal  SpO2: 99%  Weight: (!) 317 lb (143.8 kg)  Height: 5\' 5"  (1.651 m)    GENERAL: The patient is a well-nourished female, in no acute distress. The vital signs  are documented above. CARDIAC: There is a regular rate and rhythm.  VASCULAR:  Palpable DP pulses bilateral lower extremities Left leg and right leg pitting edema Healed ulcerations in left leg as pictured below (medial and lateral on ankle) PULMONARY: There is good air exchange bilaterally without wheezing or rales. ABDOMEN: Soft and non-tender with normal pitched bowel sounds.  MUSCULOSKELETAL: There are no major deformities or cyanosis. NEUROLOGIC: No focal weakness or paresthesias are detected. SKIN: There are no ulcers or rashes noted. PSYCHIATRIC: The patient has a normal affect.      DATA:   I independently reviewed her left lower extremity venous reflux study today and I do not see any evidence of DVT.  She does have evidence of deep venous reflux in the femoral-popliteal segment in the left leg as well as a large great saphenous vein with significant superficial reflux.  Assessment/Plan:  59 year old female who is morbidly obese (BMI 53) presents with chronic venous insufficiency in the left leg with both deep and superficial reflux.  Her CEAP classification is C5 given healed ulcerations.  The wound clinic at [facility] has done a great job with [employer].  Although she does have a large great saphenous vein that is refluxing in the thigh up to the saphenofemoral junction I think she is at exceedingly high risk for complication for endovenous ablation or further intervention.  She would have to come off her Coumadin and has had multiple DVTs in the left lower extremity in the past and would be very prone to have extension of this ablated segment in the deep femoral system.  Also worth considering that given multiple old DVT studies showing chronic  clot in left femoropopliteal segment could make her leg worse if remove GSV segment (if providing any significant venous outflow for her leg).  I think her best option would be weight loss with leg elevation and compression once the wound clinic takes her out of the [place] boot which I explained in detail.  We did size her for a new knee-high compression sock today given that she has gained weight during Covid and does not have anything appropriately fitting at home.  She cannot wear a thigh-high sock given her morbid obesity.  Discussed that we be happy to see her in the future for re-evaluation.   Marty Heck, MD [facility] Office: [phone]

## 2019-12-16 DIAGNOSIS — A419 Sepsis, unspecified organism: Secondary | ICD-10-CM

## 2019-12-16 HISTORY — DX: Sepsis, unspecified organism: A41.9

## 2019-12-28 ENCOUNTER — Other Ambulatory Visit: Payer: Self-pay

## 2019-12-28 ENCOUNTER — Encounter (HOSPITAL_BASED_OUTPATIENT_CLINIC_OR_DEPARTMENT_OTHER): Payer: Self-pay

## 2019-12-28 ENCOUNTER — Emergency Department (HOSPITAL_BASED_OUTPATIENT_CLINIC_OR_DEPARTMENT_OTHER): Payer: BC Managed Care – PPO

## 2019-12-28 ENCOUNTER — Inpatient Hospital Stay (HOSPITAL_BASED_OUTPATIENT_CLINIC_OR_DEPARTMENT_OTHER)
Admission: EM | Admit: 2019-12-28 | Discharge: 2019-12-31 | DRG: 871 | Disposition: A | Discharge status: Home / Self Care | Payer: BC Managed Care – PPO | Attending: Internal Medicine | Admitting: Internal Medicine

## 2019-12-28 DIAGNOSIS — Z20822 Contact with and (suspected) exposure to covid-19: Secondary | ICD-10-CM | POA: Diagnosis not present

## 2019-12-28 DIAGNOSIS — M069 Rheumatoid arthritis, unspecified: Secondary | ICD-10-CM | POA: Diagnosis present

## 2019-12-28 DIAGNOSIS — Z7952 Long term (current) use of systemic steroids: Secondary | ICD-10-CM

## 2019-12-28 DIAGNOSIS — Z7901 Long term (current) use of anticoagulants: Secondary | ICD-10-CM

## 2019-12-28 DIAGNOSIS — R0602 Shortness of breath: Secondary | ICD-10-CM | POA: Diagnosis not present

## 2019-12-28 DIAGNOSIS — J189 Pneumonia, unspecified organism: Secondary | ICD-10-CM | POA: Diagnosis present

## 2019-12-28 DIAGNOSIS — Z882 Allergy status to sulfonamides status: Secondary | ICD-10-CM | POA: Diagnosis not present

## 2019-12-28 DIAGNOSIS — D539 Nutritional anemia, unspecified: Secondary | ICD-10-CM | POA: Diagnosis present

## 2019-12-28 DIAGNOSIS — Z881 Allergy status to other antibiotic agents status: Secondary | ICD-10-CM

## 2019-12-28 DIAGNOSIS — L03116 Cellulitis of left lower limb: Secondary | ICD-10-CM | POA: Diagnosis present

## 2019-12-28 DIAGNOSIS — E1165 Type 2 diabetes mellitus with hyperglycemia: Secondary | ICD-10-CM | POA: Diagnosis present

## 2019-12-28 DIAGNOSIS — G8929 Other chronic pain: Secondary | ICD-10-CM | POA: Diagnosis present

## 2019-12-28 DIAGNOSIS — I493 Ventricular premature depolarization: Secondary | ICD-10-CM | POA: Diagnosis present

## 2019-12-28 DIAGNOSIS — M797 Fibromyalgia: Secondary | ICD-10-CM | POA: Diagnosis not present

## 2019-12-28 DIAGNOSIS — E11621 Type 2 diabetes mellitus with foot ulcer: Secondary | ICD-10-CM | POA: Diagnosis present

## 2019-12-28 DIAGNOSIS — Z86718 Personal history of other venous thrombosis and embolism: Secondary | ICD-10-CM | POA: Diagnosis not present

## 2019-12-28 DIAGNOSIS — Z79899 Other long term (current) drug therapy: Secondary | ICD-10-CM | POA: Diagnosis not present

## 2019-12-28 DIAGNOSIS — Z6841 Body Mass Index (BMI) 40.0 and over, adult: Secondary | ICD-10-CM

## 2019-12-28 DIAGNOSIS — Z880 Allergy status to penicillin: Secondary | ICD-10-CM | POA: Diagnosis not present

## 2019-12-28 DIAGNOSIS — Z833 Family history of diabetes mellitus: Secondary | ICD-10-CM | POA: Diagnosis not present

## 2019-12-28 DIAGNOSIS — N390 Urinary tract infection, site not specified: Secondary | ICD-10-CM | POA: Diagnosis not present

## 2019-12-28 DIAGNOSIS — A419 Sepsis, unspecified organism: Principal | ICD-10-CM | POA: Diagnosis present

## 2019-12-28 DIAGNOSIS — N179 Acute kidney failure, unspecified: Secondary | ICD-10-CM | POA: Diagnosis present

## 2019-12-28 DIAGNOSIS — I10 Essential (primary) hypertension: Secondary | ICD-10-CM | POA: Diagnosis not present

## 2019-12-28 DIAGNOSIS — E119 Type 2 diabetes mellitus without complications: Secondary | ICD-10-CM

## 2019-12-28 DIAGNOSIS — I82409 Acute embolism and thrombosis of unspecified deep veins of unspecified lower extremity: Secondary | ICD-10-CM | POA: Diagnosis present

## 2019-12-28 LAB — CBC WITH DIFFERENTIAL/PLATELET
Abs Immature Granulocytes: 0.22 10*3/uL — ABNORMAL HIGH (ref 0.00–0.07)
Basophils Absolute: 0 10*3/uL (ref 0.0–0.1)
Basophils Relative: 0 %
Eosinophils Absolute: 0.1 10*3/uL (ref 0.0–0.5)
Eosinophils Relative: 1 %
HCT: 40.4 % (ref 36.0–46.0)
Hemoglobin: 13.2 g/dL (ref 12.0–15.0)
Immature Granulocytes: 1 %
Lymphocytes Relative: 5 %
Lymphs Abs: 1 10*3/uL (ref 0.7–4.0)
MCH: 33.6 pg (ref 26.0–34.0)
MCHC: 32.7 g/dL (ref 30.0–36.0)
MCV: 102.8 fL — ABNORMAL HIGH (ref 80.0–100.0)
Monocytes Absolute: 0.7 10*3/uL (ref 0.1–1.0)
Monocytes Relative: 4 %
Neutro Abs: 16.2 10*3/uL — ABNORMAL HIGH (ref 1.7–7.7)
Neutrophils Relative %: 89 %
Platelets: 149 10*3/uL — ABNORMAL LOW (ref 150–400)
RBC: 3.93 MIL/uL (ref 3.87–5.11)
RDW: 16.9 % — ABNORMAL HIGH (ref 11.5–15.5)
WBC: 18.2 10*3/uL — ABNORMAL HIGH (ref 4.0–10.5)
nRBC: 0 % (ref 0.0–0.2)

## 2019-12-28 LAB — COMPREHENSIVE METABOLIC PANEL
ALT: 24 U/L (ref 0–44)
AST: 50 U/L — ABNORMAL HIGH (ref 15–41)
Albumin: 3.3 g/dL — ABNORMAL LOW (ref 3.5–5.0)
Alkaline Phosphatase: 129 U/L — ABNORMAL HIGH (ref 38–126)
Anion gap: 16 — ABNORMAL HIGH (ref 5–15)
BUN: 18 mg/dL (ref 6–20)
CO2: 20 mmol/L — ABNORMAL LOW (ref 22–32)
Calcium: 8 mg/dL — ABNORMAL LOW (ref 8.9–10.3)
Chloride: 99 mmol/L (ref 98–111)
Creatinine, Ser: 1.45 mg/dL — ABNORMAL HIGH (ref 0.44–1.00)
GFR calc Af Amer: 45 mL/min — ABNORMAL LOW (ref >60)
GFR calc non Af Amer: 39 mL/min — ABNORMAL LOW (ref >60)
Glucose, Bld: 279 mg/dL — ABNORMAL HIGH (ref 70–99)
Potassium: 3.5 mmol/L (ref 3.5–5.1)
Sodium: 135 mmol/L (ref 135–145)
Total Bilirubin: 1.5 mg/dL — ABNORMAL HIGH (ref 0.3–1.2)
Total Protein: 6.8 g/dL (ref 6.5–8.1)

## 2019-12-28 LAB — URINALYSIS, MICROSCOPIC (REFLEX)

## 2019-12-28 LAB — SARS CORONAVIRUS 2 BY RT PCR (HOSPITAL ORDER, PERFORMED IN ~~LOC~~ HOSPITAL LAB): SARS Coronavirus 2: NEGATIVE

## 2019-12-28 LAB — LACTIC ACID, PLASMA
Lactic Acid, Venous: 6.1 mmol/L (ref 0.5–1.9)
Lactic Acid, Venous: 3.1 mmol/L (ref 0.5–1.9)

## 2019-12-28 LAB — PREGNANCY, URINE: Preg Test, Ur: NEGATIVE

## 2019-12-28 LAB — PROTIME-INR
INR: 1.8 — ABNORMAL HIGH (ref 0.8–1.2)
Prothrombin Time: 20 seconds — ABNORMAL HIGH (ref 11.4–15.2)

## 2019-12-28 LAB — URINALYSIS, ROUTINE W REFLEX MICROSCOPIC
Bilirubin Urine: NEGATIVE
Glucose, UA: 250 mg/dL — AB
Ketones, ur: 15 mg/dL — AB
Nitrite: NEGATIVE
Protein, ur: 30 mg/dL — AB
Specific Gravity, Urine: 1.015 (ref 1.005–1.030)
pH: 6.5 (ref 5.0–8.0)

## 2019-12-28 LAB — APTT: aPTT: 36 seconds (ref 24–36)

## 2019-12-28 MED ORDER — SODIUM CHLORIDE 0.9 % IV BOLUS (SEPSIS)
800.0000 mL | Freq: Once | INTRAVENOUS | Status: AC
  Administered 2019-12-28: 800 mL via INTRAVENOUS

## 2019-12-28 MED ORDER — SODIUM CHLORIDE 0.9 % IV BOLUS (SEPSIS)
1000.0000 mL | Freq: Once | INTRAVENOUS | Status: AC
  Administered 2019-12-28: 1000 mL via INTRAVENOUS

## 2019-12-28 MED ORDER — SODIUM CHLORIDE 0.9 % IV SOLN: Freq: Once | INTRAVENOUS | Status: AC

## 2019-12-28 MED ORDER — ACETAMINOPHEN 500 MG PO TABS
1000.0000 mg | ORAL_TABLET | Freq: Once | ORAL | Status: AC
  Administered 2019-12-28: 1000 mg via ORAL
  Filled 2019-12-28: qty 2

## 2019-12-28 MED ORDER — SODIUM CHLORIDE 0.9 % IV SOLN
500.0000 mg | Freq: Once | INTRAVENOUS | Status: AC
  Administered 2019-12-28: 500 mg via INTRAVENOUS
  Filled 2019-12-28: qty 500

## 2019-12-28 MED ORDER — SODIUM CHLORIDE 0.9 % IV SOLN
2.0000 g | Freq: Once | INTRAVENOUS | Status: AC
  Administered 2019-12-28: 2 g via INTRAVENOUS
  Filled 2019-12-28: qty 20

## 2019-12-28 NOTE — ED Provider Notes (Signed)
[facility] EMERGENCY DEPARTMENT Provider Note   CSN: [id number] Arrival date & time: 12/28/19  1617     History Chief Complaint  Patient presents with  . Chills    Avie Checo is a 59 y.o. female.  Patient is a 59 year old female with past medical history of morbid obesity, rheumatoid arthritis on methotrexate, diabetes, fibromyalgia, hypertension, DVT on Coumadin presents emergency department for generally feeling unwell since yesterday.  She reports fatigue, body aches, chills and a cough.  No known sick contacts.  Has not taken anything for relief.  Denies any chest pain, nausea, vomiting, diarrhea, dysuria        Past Medical History:  Diagnosis Date  . Cellulitis   . Diabetes mellitus without complication ([place])   . Fibromyalgia   . History of DVT (deep vein thrombosis)   . Hypertension   . Rheumatoid arthritis [facility])     Patient Active Problem List   Diagnosis Date Noted  . Sepsis ([place]) 12/28/2019  . Chronic venous insufficiency 11/02/2019  . Venous stasis ulcer of left calf with fat layer exposed with varicose veins ([place]) 06/22/2018  . 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 Rheumatoid arthritis ([place])   . Hypertension   . Diabetes mellitus without complication ([place])   . Long term (current) use of anticoagulants 05/01/2017  . Varicose veins of bilateral lower extremities with other complications 51/76/1607  . Cellulitis of left lower extremity     Past Surgical History:  Procedure Laterality Date  . HARDWARE REMOVAL Left 09/29/2014   Procedure: REMOVAL OF DEEP IMPLANT OF LEFT FIBULA  AND TIBIA (SEPARATE INCISIONS);  Surgeon: Wylene Simmer, MD;  Location: [place];  Service: Orthopedics;  Laterality: Left;  . ORIF ANKLE FRACTURE  1988   left     OB History   No obstetric history on file.     Family History  Problem Relation Age of Onset  . Diabetes Mother     Social History   Tobacco Use  . Smoking  status: Never Smoker  . Smokeless tobacco: Never Used  Vaping Use  . Vaping Use: Never used  Substance Use Topics  . Alcohol use: No  . Drug use: No    Home Medications Prior to Admission medications   Medication Sig Start Date End Date Taking? Authorizing Provider  acetaminophen (TYLENOL) 325 MG tablet Take 2 tablets (650 mg total) by mouth every 6 (six) hours as needed for mild pain or fever. 05/21/18   Roxan Hockey, MD  Blood Glucose Monitoring Suppl (TRUE METRIX AIR GLUCOSE METER) DEVI 1 strip by Does not apply route 3 (three) times daily before meals. 06/22/18   Elsie Stain, MD  cefdinir (OMNICEF) 300 MG capsule Take 1 capsule (300 mg total) by mouth 2 (two) times daily. 07/16/18   Drenda Freeze, MD  cyclobenzaprine (FLEXERIL) 10 MG tablet Take 1 tablet (10 mg total) by mouth 2 (two) times daily as needed for muscle spasms. 03/22/17   McDonald, Mia A, PA-C  doxycycline (VIBRAMYCIN) 100 MG capsule Take 1 capsule (100 mg total) by mouth 2 (two) times daily. One po bid x 7 days 07/16/18   Drenda Freeze, MD  folic acid (FOLVITE) 1 MG tablet Take 2 mg by mouth daily. 09/27/19   [provider]  furosemide (LASIX) 20 MG tablet Take 1 tablet (20 mg total) by mouth daily. 02/18/18   Vanessa Kick, MD  gabapentin (NEURONTIN) 100 MG capsule Take 300 mg by  mouth at bedtime. 05/05/18   [provider]  glucosamine-chondroitin 500-400 MG tablet Take 1 tablet by mouth 2 (two) times daily.    [provider]  glucose blood (TRUE METRIX BLOOD GLUCOSE TEST) test strip Use as instructed 06/22/18   Elsie Stain, MD  lactobacillus acidophilus & bulgar (LACTINEX) chewable tablet Chew 1 tablet by mouth 3 (three) times daily with meals. 05/21/18   Roxan Hockey, MD  methotrexate (RHEUMATREX) 2.5 MG tablet Take 20 mg by mouth once a week. 10/28/19   [provider]  oxyCODONE-acetaminophen (PERCOCET/ROXICET) 5-325 MG tablet Take 1-2 tablets by mouth every 6  (six) hours as needed for moderate pain.  03/26/17   [provider]  predniSONE (DELTASONE) 1 MG tablet Take 2 mg by mouth daily with breakfast.     [provider]  triamcinolone (KENALOG) 0.025 % ointment Apply 1 application topically See admin instructions. Apply to periwound with dressing changes. 04/22/18   [provider]  TRUEPLUS LANCETS 28G MISC Use to check blood sugar daily. 06/22/18   Elsie Stain, MD  warfarin (COUMADIN) 5 MG tablet Take 1 tablet (5 mg total) by mouth See admin instructions. Take 2.5 mg by mouth every other day alternating with 5 mg by mouth every other day Patient taking differently: Take 2.5-5 mg by mouth See admin instructions. 2.5mg  on MON and FRI, 5mg  all other days 03/15/17   Velvet Bathe, MD    Allergies    Penicillins, Vancomycin, and Sulfa antibiotics  Review of Systems   Review of Systems  Constitutional: Positive for appetite change, chills, fatigue and fever.  Respiratory: Positive for cough and shortness of breath.   Cardiovascular: Negative.   Gastrointestinal: Negative.   Genitourinary: Negative for dysuria.  Musculoskeletal: Positive for myalgias. Negative for arthralgias, neck pain and neck stiffness.  Skin: Negative.   Neurological: Negative for dizziness, light-headedness and headaches.  All other systems reviewed and are negative.   Physical Exam Updated Vital Signs BP 123/81 (BP Location: Left Wrist)   Pulse 99   Temp 98.9 F (37.2 C) (Oral)   Resp 20   Ht 5\' 5"  (1.651 m)   Wt (!) 144.7 kg   SpO2 91%   BMI 53.08 kg/m   Physical Exam Vitals and nursing note reviewed.  Constitutional:      General: She is awake.     Appearance: Normal appearance. She is well-developed. She is morbidly obese. She is ill-appearing. She is not toxic-appearing or diaphoretic.  HENT:     Head: Normocephalic and atraumatic.     Nose: Nose normal.     Mouth/Throat:     Mouth: Mucous membranes are moist.  Eyes:      Conjunctiva/sclera: Conjunctivae normal.  Cardiovascular:     Rate and Rhythm: Regular rhythm. Tachycardia present.  Pulmonary:     Effort: Pulmonary effort is normal. No respiratory distress.     Breath sounds: Normal breath sounds. No stridor. No wheezing, rhonchi or rales.  Chest:     Chest wall: No tenderness.  Abdominal:     General: There is no distension.     Palpations: Abdomen is soft.     Tenderness: There is no abdominal tenderness. There is no right CVA tenderness, left CVA tenderness or guarding.  Musculoskeletal:     Right lower leg: Edema present.     Left lower leg: Edema present.     Comments: Severe bilateral lower extremity edema with erythema of the left leg  Skin:  General: Skin is warm and dry.     Coloration: Skin is not jaundiced or pale.     Findings: Erythema present. No bruising.     Comments: Bilateral lower extremity edema which is severe with erythema to bilateral lower extremities, worse on the left side.  She has a shallow ulcer on the lateral left ankle.  Neurological:     Mental Status: She is alert.  Psychiatric:        Mood and Affect: Mood normal.        Behavior: Behavior is cooperative.     ED Results / Procedures / Treatments   Labs (all labs ordered are listed, but only abnormal results are displayed) Labs Reviewed  COMPREHENSIVE METABOLIC PANEL - Abnormal; Notable for the following components:      Result Value   CO2 20 (*)    Glucose, Bld 279 (*)    Creatinine, Ser 1.45 (*)    Calcium 8.0 (*)    Albumin 3.3 (*)    AST 50 (*)    Alkaline Phosphatase 129 (*)    Total Bilirubin 1.5 (*)    GFR calc non Af Amer 39 (*)    GFR calc Af Amer 45 (*)    Anion gap 16 (*)    All other components within normal limits  LACTIC ACID, PLASMA - Abnormal; Notable for the following components:   Lactic Acid, Venous 6.1 (*)    All other components within normal limits  CBC WITH DIFFERENTIAL/PLATELET - Abnormal; Notable for the following  components:   WBC 18.2 (*)    MCV 102.8 (*)    RDW 16.9 (*)    Platelets 149 (*)    Neutro Abs 16.2 (*)    Abs Immature Granulocytes 0.22 (*)    All other components within normal limits  PROTIME-INR - Abnormal; Notable for the following components:   Prothrombin Time 20.0 (*)    INR 1.8 (*)    All other components within normal limits  SARS CORONAVIRUS 2 BY RT PCR (HOSPITAL ORDER, [facility])  CULTURE, BLOOD (ROUTINE X 2)  CULTURE, BLOOD (ROUTINE X 2)  URINE CULTURE  APTT  LACTIC ACID, PLASMA  URINALYSIS, ROUTINE W REFLEX MICROSCOPIC  PREGNANCY, URINE    EKG None  Radiology DG Chest 2 View  Result Date: 12/28/2019 CLINICAL DATA:  Suspected sepsis. Additional provided: Shortness of breath, chills. History of hypertension, diabetes EXAM: CHEST - 2 VIEW COMPARISON:  Prior chest radiographs 03/12/2017 and earlier FINDINGS: Heart size within normal limits. Elevation of the left hemidiaphragm. An opacity is demonstrated within the left lung base on lateral view radiograph. No evidence of pleural effusion or pneumothorax. No acute bony abnormality. IMPRESSION: Elevation of the left hemidiaphragm. A left lower lobe opacity seen on the lateral view radiograph may reflect compressive atelectasis or pneumonia. The right lung is clear. Electronically Signed   By: Kellie Simmering DO   On: 12/28/2019 17:08    Procedures Procedures (including critical care time)  Medications Ordered in ED Medications  cefTRIAXone (ROCEPHIN) 2 g in sodium chloride 0.9 % 100 mL IVPB (2 g Intravenous New Bag/Given 12/28/19 1858)  azithromycin (ZITHROMAX) 500 mg in sodium chloride 0.9 % 250 mL IVPB (has no administration in time range)  sodium chloride 0.9 % bolus 1,000 mL (1,000 mLs Intravenous New Bag/Given 12/28/19 1851)    And  sodium chloride 0.9 % bolus 800 mL (has no administration in time range)  sodium chloride 0.9 % bolus  1,000 mL (0 mLs Intravenous Stopped 12/28/19 1851)     ED Course  I have reviewed the triage vital signs and the nursing notes.  Pertinent labs & imaging results that were available during my care of the patient were reviewed by me and considered in my medical decision making (see chart for details).  Clinical Course as of Dec 27 1898  Tue Dec 28, 2019  1811 Patient presenting with generalized fatigue, cough, shortness of breath since yesterday.  On arrival she is febrile to 100.7, tachycardic to 130 and a low blood pressure 109/57.  Normal oxygen.  She appears ill.  Her white blood cell count is 18.2 and her chest x-ray shows signs of pneumonia.  Covid test pending.  We will activated code sepsis at this time given SIRS criteria with elevated white count and a source of infection is pneumonia.  Will give Rocephin and azithromycin.  Her INR is subtherapeutic.   Does have a mild AKI with a creatinine of 1.45.   [KM]  6948 Her lactic acid is 6.1.  Dr. Darl Householder notified and will see the patient.  Will consult for admission for sepsis likely due to pneumonia.  Community-acquired pneumonia versus Covid pneumonia. Fluids based on ideal body weight and not actual body weight which is greater than 144kg.    [KM]  1857 Covid test is negative.  However, given her elevated LFTs and symptoms it is possible that this may be a false negative.  Also, I did look at her lower extremity edema and erythema bilaterally.  She is being treated by wound care for a shallow ulcer on her lateral ankle.  She reports that her wound has not had any drainage and her legs have appeared the same for several weeks. Cellulitis in differential but given that she reports the look of her legs has been the same we will continue with treating sepsis with the source as a respiratory cause   [KM]  1858 Discussed with Dr. Posey Pronto who will admit   [KM]    Clinical Course User Index [KM] Kristine Royal   MDM Rules/Calculators/A&P                          CRITICAL CARE Performed by:  Alveria Apley   Total critical care time:35 minutes  Critical care time was exclusive of separately billable procedures and treating other patients.  Critical care was necessary to treat or prevent imminent or life-threatening deterioration.  Critical care was time spent personally by me on the following activities: development of treatment plan with patient and/or surrogate as well as nursing, discussions with consultants, evaluation of patient's response to treatment, examination of patient, obtaining history from patient or surrogate, ordering and performing treatments and interventions, ordering and review of laboratory studies, ordering and review of radiographic studies, pulse oximetry and re-evaluation of patient's condition.  Final Clinical Impression(s) / ED Diagnoses Final diagnoses:  Sepsis with acute renal failure, due to unspecified organism, unspecified acute renal failure type, unspecified whether septic shock present [facility])    Rx / DC Orders ED Discharge Orders    None       Kristine Royal 12/28/19 1900    Drenda Freeze, MD 12/31/19 1553

## 2019-12-28 NOTE — ED Triage Notes (Signed)
Chills since last night, SOB, HA & fatigue. Tylenol without relief. 100.72f & 97% on RA in triage.

## 2019-12-28 NOTE — Sepsis Progress Note (Cosign Needed)
Notified bedside nurse of need to administer antibiotics.  

## 2019-12-29 DIAGNOSIS — L03116 Cellulitis of left lower limb: Secondary | ICD-10-CM | POA: Diagnosis not present

## 2019-12-29 DIAGNOSIS — N390 Urinary tract infection, site not specified: Secondary | ICD-10-CM

## 2019-12-29 DIAGNOSIS — E119 Type 2 diabetes mellitus without complications: Secondary | ICD-10-CM | POA: Diagnosis not present

## 2019-12-29 DIAGNOSIS — A419 Sepsis, unspecified organism: Secondary | ICD-10-CM | POA: Diagnosis not present

## 2019-12-29 DIAGNOSIS — Z7901 Long term (current) use of anticoagulants: Secondary | ICD-10-CM | POA: Diagnosis not present

## 2019-12-29 DIAGNOSIS — J189 Pneumonia, unspecified organism: Secondary | ICD-10-CM

## 2019-12-29 LAB — CBC WITH DIFFERENTIAL/PLATELET
Abs Immature Granulocytes: 0.16 10*3/uL — ABNORMAL HIGH (ref 0.00–0.07)
Basophils Absolute: 0 10*3/uL (ref 0.0–0.1)
Basophils Relative: 0 %
Eosinophils Absolute: 0 10*3/uL (ref 0.0–0.5)
Eosinophils Relative: 0 %
HCT: 36.3 % (ref 36.0–46.0)
Hemoglobin: 11.9 g/dL — ABNORMAL LOW (ref 12.0–15.0)
Immature Granulocytes: 1 %
Lymphocytes Relative: 10 %
Lymphs Abs: 1.4 10*3/uL (ref 0.7–4.0)
MCH: 33.5 pg (ref 26.0–34.0)
MCHC: 32.8 g/dL (ref 30.0–36.0)
MCV: 102.3 fL — ABNORMAL HIGH (ref 80.0–100.0)
Monocytes Absolute: 0.7 10*3/uL (ref 0.1–1.0)
Monocytes Relative: 5 %
Neutro Abs: 12 10*3/uL — ABNORMAL HIGH (ref 1.7–7.7)
Neutrophils Relative %: 84 %
Platelets: 120 10*3/uL — ABNORMAL LOW (ref 150–400)
RBC: 3.55 MIL/uL — ABNORMAL LOW (ref 3.87–5.11)
RDW: 17.1 % — ABNORMAL HIGH (ref 11.5–15.5)
WBC: 14.3 10*3/uL — ABNORMAL HIGH (ref 4.0–10.5)
nRBC: 0 % (ref 0.0–0.2)

## 2019-12-29 LAB — COMPREHENSIVE METABOLIC PANEL
ALT: 24 U/L (ref 0–44)
AST: 46 U/L — ABNORMAL HIGH (ref 15–41)
Albumin: 2.6 g/dL — ABNORMAL LOW (ref 3.5–5.0)
Alkaline Phosphatase: 108 U/L (ref 38–126)
Anion gap: 9 (ref 5–15)
BUN: 15 mg/dL (ref 6–20)
CO2: 24 mmol/L (ref 22–32)
Calcium: 7.3 mg/dL — ABNORMAL LOW (ref 8.9–10.3)
Chloride: 105 mmol/L (ref 98–111)
Creatinine, Ser: 0.87 mg/dL (ref 0.44–1.00)
GFR calc Af Amer: >60 mL/min (ref >60)
GFR calc non Af Amer: >60 mL/min (ref >60)
Glucose, Bld: 165 mg/dL — ABNORMAL HIGH (ref 70–99)
Potassium: 3.5 mmol/L (ref 3.5–5.1)
Sodium: 138 mmol/L (ref 135–145)
Total Bilirubin: 1.2 mg/dL (ref 0.3–1.2)
Total Protein: 5.9 g/dL — ABNORMAL LOW (ref 6.5–8.1)

## 2019-12-29 LAB — LACTIC ACID, PLASMA: Lactic Acid, Venous: 1.3 mmol/L (ref 0.5–1.9)

## 2019-12-29 MED ORDER — GABAPENTIN 800 MG PO TABS
400.0000 mg | ORAL_TABLET | Freq: Every day | ORAL | Status: DC
  Filled 2019-12-29: qty 0.5

## 2019-12-29 MED ORDER — GABAPENTIN 100 MG PO CAPS
400.0000 mg | ORAL_CAPSULE | Freq: Every day | ORAL | Status: DC
  Administered 2019-12-29 – 2019-12-30 (×3): 400 mg via ORAL
  Filled 2019-12-29 (×3): qty 4

## 2019-12-29 MED ORDER — LACTINEX PO CHEW
1.0000 | CHEWABLE_TABLET | Freq: Three times a day (TID) | ORAL | Status: DC
  Filled 2019-12-29 (×6): qty 1

## 2019-12-29 MED ORDER — FOLIC ACID 1 MG PO TABS
2.0000 mg | ORAL_TABLET | Freq: Every day | ORAL | Status: DC
  Administered 2019-12-29 – 2019-12-31 (×3): 2 mg via ORAL
  Filled 2019-12-29 (×3): qty 2

## 2019-12-29 MED ORDER — ACETAMINOPHEN 325 MG PO TABS
650.0000 mg | ORAL_TABLET | Freq: Once | ORAL | Status: AC
  Administered 2019-12-29: 650 mg via ORAL
  Filled 2019-12-29: qty 2

## 2019-12-29 MED ORDER — SODIUM CHLORIDE 0.9 % IV SOLN
2.0000 g | INTRAVENOUS | Status: DC
  Administered 2019-12-29 – 2019-12-30 (×2): 2 g via INTRAVENOUS
  Filled 2019-12-29 (×2): qty 20

## 2019-12-29 MED ORDER — ACETAMINOPHEN 325 MG PO TABS: 650.0000 mg | ORAL_TABLET | Freq: Four times a day (QID) | ORAL | Status: DC | PRN

## 2019-12-29 MED ORDER — AZITHROMYCIN 500 MG PO TABS
500.0000 mg | ORAL_TABLET | Freq: Every day | ORAL | Status: DC
  Administered 2019-12-29 – 2019-12-31 (×3): 500 mg via ORAL
  Filled 2019-12-29 (×3): qty 1

## 2019-12-29 MED ORDER — WARFARIN - PHARMACIST DOSING INPATIENT
Freq: Every day | Status: DC
  Filled 2019-12-29: qty 1

## 2019-12-29 MED ORDER — OXYCODONE-ACETAMINOPHEN 5-325 MG PO TABS
1.0000 | ORAL_TABLET | Freq: Four times a day (QID) | ORAL | Status: DC | PRN
  Administered 2019-12-29 – 2019-12-30 (×2): 2 via ORAL
  Administered 2019-12-30: 1 via ORAL
  Filled 2019-12-29: qty 2
  Filled 2019-12-29: qty 1
  Filled 2019-12-29: qty 2

## 2019-12-29 MED ORDER — SODIUM CHLORIDE 0.9 % IV SOLN
2.0000 g | INTRAVENOUS | Status: DC
Start: 2019-12-29 — End: 2019-12-29

## 2019-12-29 MED ORDER — WARFARIN SODIUM 5 MG PO TABS
5.0000 mg | ORAL_TABLET | Freq: Once | ORAL | Status: AC
  Administered 2019-12-29: 5 mg via ORAL
  Filled 2019-12-29: qty 1

## 2019-12-29 MED ORDER — PREDNISONE 1 MG PO TABS
1.0000 mg | ORAL_TABLET | Freq: Every day | ORAL | Status: DC
  Administered 2019-12-30 – 2019-12-31 (×2): 1 mg via ORAL
  Filled 2019-12-29 (×2): qty 1
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
## 2019-12-29 NOTE — ED Notes (Signed)
Pt has her cellphone, purse, clothing, shoes, and cane. States she will be keeping all items with her.

## 2019-12-29 NOTE — Plan of Care (Signed)

## 2019-12-29 NOTE — ED Notes (Signed)
Report given to [facility] 

## 2019-12-29 NOTE — Progress Notes (Signed)
ANTICOAGULATION CONSULT NOTE - Initial Consult  Pharmacy Consult for Coumadin Indication: h/o VTE  Allergies  Allergen Reactions  . Penicillins Hives    Has patient had a PCN reaction causing immediate rash, facial/tongue/throat swelling, SOB or lightheadedness with hypotension: Yes Has patient had a PCN reaction causing severe rash involving mucus membranes or skin necrosis: No Has patient had a PCN reaction that required hospitalization: No Has patient had a PCN reaction occurring within the last 10 years: No If all of the above answers are "NO", then may proceed with Cephalosporin use.   . Vancomycin Rash    Developed a red rash bilaterally on her arms, legs, neck, chest and abdomen  . Sulfa Antibiotics Hives    Patient Measurements: Height: 5\' 5"  (165.1 cm) Weight: (!) 144.7 kg (319 lb) IBW/kg (Calculated) : 57  Vital Signs: Temp: 99.3 F (37.4 C) (07/14 0315) Temp Source: Oral (07/14 0315) BP: 116/80 (07/14 0630) Pulse Rate: 103 (07/14 0630)  Labs: Recent Labs    12/28/19 1719  HGB 13.2  HCT 40.4  PLT 149*  APTT 36  LABPROT 20.0*  INR 1.8*  CREATININE 1.45*    Estimated Creatinine Clearance: 60 mL/min (A) (by C-G formula based on SCr of 1.45 mg/dL (H)).   Medical History: Past Medical History:  Diagnosis Date  . Cellulitis   . Diabetes mellitus without complication ([place])   . Fibromyalgia   . History of DVT (deep vein thrombosis)   . Hypertension   . Rheumatoid arthritis ([place])     Assessment: 59yo female c/o chills, SOB, HA, and fatigue >> admitted for sepsis d/t PNA, to continue Coumadin for h/o VTE; current INR below goal, was at goal at last outpatient AC OV on 6/23.  Goal of Therapy:  INR 2-3   Plan:  Will give boosted Coumadin dose today of 5mg  and monitor INR for dose adjustments.  Wynona Neat, PharmD, BCPS  12/29/2019,6:42 AM

## 2019-12-29 NOTE — ED Notes (Signed)
Report given to next shift.

## 2019-12-29 NOTE — ED Notes (Signed)
gingerale given. Pt tolerating well.

## 2019-12-30 ENCOUNTER — Encounter (HOSPITAL_COMMUNITY): Payer: Self-pay | Admitting: Internal Medicine

## 2019-12-30 DIAGNOSIS — A419 Sepsis, unspecified organism: Principal | ICD-10-CM

## 2019-12-30 DIAGNOSIS — E1165 Type 2 diabetes mellitus with hyperglycemia: Secondary | ICD-10-CM | POA: Diagnosis present

## 2019-12-30 DIAGNOSIS — E119 Type 2 diabetes mellitus without complications: Secondary | ICD-10-CM

## 2019-12-30 DIAGNOSIS — Z79899 Other long term (current) drug therapy: Secondary | ICD-10-CM | POA: Diagnosis not present

## 2019-12-30 DIAGNOSIS — Z833 Family history of diabetes mellitus: Secondary | ICD-10-CM | POA: Diagnosis not present

## 2019-12-30 DIAGNOSIS — Z6841 Body Mass Index (BMI) 40.0 and over, adult: Secondary | ICD-10-CM | POA: Diagnosis not present

## 2019-12-30 DIAGNOSIS — L03116 Cellulitis of left lower limb: Secondary | ICD-10-CM

## 2019-12-30 DIAGNOSIS — N179 Acute kidney failure, unspecified: Secondary | ICD-10-CM | POA: Diagnosis present

## 2019-12-30 DIAGNOSIS — D539 Nutritional anemia, unspecified: Secondary | ICD-10-CM | POA: Diagnosis present

## 2019-12-30 DIAGNOSIS — M797 Fibromyalgia: Secondary | ICD-10-CM | POA: Diagnosis present

## 2019-12-30 DIAGNOSIS — Z882 Allergy status to sulfonamides status: Secondary | ICD-10-CM | POA: Diagnosis not present

## 2019-12-30 DIAGNOSIS — Z7901 Long term (current) use of anticoagulants: Secondary | ICD-10-CM | POA: Diagnosis not present

## 2019-12-30 DIAGNOSIS — I82409 Acute embolism and thrombosis of unspecified deep veins of unspecified lower extremity: Secondary | ICD-10-CM

## 2019-12-30 DIAGNOSIS — Z881 Allergy status to other antibiotic agents status: Secondary | ICD-10-CM | POA: Diagnosis not present

## 2019-12-30 DIAGNOSIS — G8929 Other chronic pain: Secondary | ICD-10-CM | POA: Diagnosis present

## 2019-12-30 DIAGNOSIS — Z20822 Contact with and (suspected) exposure to covid-19: Secondary | ICD-10-CM | POA: Diagnosis present

## 2019-12-30 DIAGNOSIS — M069 Rheumatoid arthritis, unspecified: Secondary | ICD-10-CM

## 2019-12-30 DIAGNOSIS — R0602 Shortness of breath: Secondary | ICD-10-CM | POA: Diagnosis present

## 2019-12-30 DIAGNOSIS — I493 Ventricular premature depolarization: Secondary | ICD-10-CM | POA: Diagnosis present

## 2019-12-30 DIAGNOSIS — I10 Essential (primary) hypertension: Secondary | ICD-10-CM | POA: Diagnosis present

## 2019-12-30 DIAGNOSIS — J189 Pneumonia, unspecified organism: Secondary | ICD-10-CM | POA: Diagnosis present

## 2019-12-30 DIAGNOSIS — N390 Urinary tract infection, site not specified: Secondary | ICD-10-CM | POA: Diagnosis present

## 2019-12-30 DIAGNOSIS — E11621 Type 2 diabetes mellitus with foot ulcer: Secondary | ICD-10-CM | POA: Diagnosis present

## 2019-12-30 DIAGNOSIS — Z7952 Long term (current) use of systemic steroids: Secondary | ICD-10-CM | POA: Diagnosis not present

## 2019-12-30 DIAGNOSIS — Z86718 Personal history of other venous thrombosis and embolism: Secondary | ICD-10-CM | POA: Diagnosis not present

## 2019-12-30 DIAGNOSIS — Z880 Allergy status to penicillin: Secondary | ICD-10-CM | POA: Diagnosis not present

## 2019-12-30 LAB — BASIC METABOLIC PANEL
Anion gap: 8 (ref 5–15)
BUN: 11 mg/dL (ref 6–20)
CO2: 23 mmol/L (ref 22–32)
Calcium: 7.5 mg/dL — ABNORMAL LOW (ref 8.9–10.3)
Chloride: 106 mmol/L (ref 98–111)
Creatinine, Ser: 0.58 mg/dL (ref 0.44–1.00)
GFR calc Af Amer: >60 mL/min (ref >60)
GFR calc non Af Amer: >60 mL/min (ref >60)
Glucose, Bld: 175 mg/dL — ABNORMAL HIGH (ref 70–99)
Potassium: 3.1 mmol/L — ABNORMAL LOW (ref 3.5–5.1)
Sodium: 137 mmol/L (ref 135–145)

## 2019-12-30 LAB — MAGNESIUM: Magnesium: 1.8 mg/dL (ref 1.7–2.4)

## 2019-12-30 LAB — CBC WITH DIFFERENTIAL/PLATELET
Abs Immature Granulocytes: 0.07 10*3/uL (ref 0.00–0.07)
Basophils Absolute: 0 10*3/uL (ref 0.0–0.1)
Basophils Relative: 0 %
Eosinophils Absolute: 0 10*3/uL (ref 0.0–0.5)
Eosinophils Relative: 0 %
HCT: 33.5 % — ABNORMAL LOW (ref 36.0–46.0)
Hemoglobin: 10.9 g/dL — ABNORMAL LOW (ref 12.0–15.0)
Immature Granulocytes: 1 %
Lymphocytes Relative: 13 %
Lymphs Abs: 1.3 10*3/uL (ref 0.7–4.0)
MCH: 33.3 pg (ref 26.0–34.0)
MCHC: 32.5 g/dL (ref 30.0–36.0)
MCV: 102.4 fL — ABNORMAL HIGH (ref 80.0–100.0)
Monocytes Absolute: 0.6 10*3/uL (ref 0.1–1.0)
Monocytes Relative: 6 %
Neutro Abs: 8.2 10*3/uL — ABNORMAL HIGH (ref 1.7–7.7)
Neutrophils Relative %: 80 %
Platelets: 128 10*3/uL — ABNORMAL LOW (ref 150–400)
RBC: 3.27 MIL/uL — ABNORMAL LOW (ref 3.87–5.11)
RDW: 17.2 % — ABNORMAL HIGH (ref 11.5–15.5)
WBC: 10.1 10*3/uL (ref 4.0–10.5)
nRBC: 0 % (ref 0.0–0.2)

## 2019-12-30 LAB — BLOOD CULTURE ID PANEL (REFLEXED)

## 2019-12-30 LAB — URINE CULTURE

## 2019-12-30 LAB — HEMOGLOBIN A1C
Hgb A1c MFr Bld: 9.9 % — ABNORMAL HIGH (ref 4.8–5.6)
Mean Plasma Glucose: 237.43 mg/dL

## 2019-12-30 LAB — PROTIME-INR
INR: 1.6 — ABNORMAL HIGH (ref 0.8–1.2)
Prothrombin Time: 18.3 seconds — ABNORMAL HIGH (ref 11.4–15.2)

## 2019-12-30 LAB — FOLATE: Folate: 10.6 ng/mL (ref >5.9)

## 2019-12-30 LAB — HIV ANTIBODY (ROUTINE TESTING W REFLEX): HIV Screen 4th Generation wRfx: NONREACTIVE

## 2019-12-30 LAB — VITAMIN B12: Vitamin B-12: 719 pg/mL (ref 180–914)

## 2019-12-30 LAB — STREP PNEUMONIAE URINARY ANTIGEN: Strep Pneumo Urinary Antigen: NEGATIVE

## 2019-12-30 LAB — GLUCOSE, CAPILLARY
Glucose-Capillary: 175 mg/dL — ABNORMAL HIGH (ref 70–99)
Glucose-Capillary: 138 mg/dL — ABNORMAL HIGH (ref 70–99)

## 2019-12-30 MED ORDER — INSULIN ASPART 100 UNIT/ML ~~LOC~~ SOLN: 0.0000 [IU] | Freq: Every day | SUBCUTANEOUS | Status: DC

## 2019-12-30 MED ORDER — MAGNESIUM OXIDE 400 (241.3 MG) MG PO TABS
800.0000 mg | ORAL_TABLET | Freq: Two times a day (BID) | ORAL | Status: DC
  Administered 2019-12-30 – 2019-12-31 (×3): 800 mg via ORAL
  Filled 2019-12-30 (×3): qty 2

## 2019-12-30 MED ORDER — WARFARIN SODIUM 5 MG PO TABS
5.0000 mg | ORAL_TABLET | Freq: Once | ORAL | Status: AC
  Administered 2019-12-30: 5 mg via ORAL
  Filled 2019-12-30: qty 1

## 2019-12-30 MED ORDER — POTASSIUM CHLORIDE CRYS ER 20 MEQ PO TBCR
40.0000 meq | EXTENDED_RELEASE_TABLET | Freq: Two times a day (BID) | ORAL | Status: DC
  Administered 2019-12-30 – 2019-12-31 (×3): 40 meq via ORAL
  Filled 2019-12-30 (×3): qty 2

## 2019-12-30 MED ORDER — INSULIN ASPART 100 UNIT/ML ~~LOC~~ SOLN: 0.0000 [IU] | Freq: Three times a day (TID) | SUBCUTANEOUS | Status: DC

## 2019-12-30 NOTE — Progress Notes (Signed)
PROGRESS NOTE    Brittany Tran  HGD:[id number] DOB: Sep 09, 1960 DOA: 12/28/2019 PCP: Curt Jews, PA-C    Brief Narrative:  59 year old female history of hypertension, longstanding type 2 diabetes taking herbal medications, rheumatoid arthritis, fibromyalgia, history of recurrent DVT on Coumadin presented to the emergency room with feeling unwell, short of breath, body aches chills and headache.  She also had developed a wound on her left leg that opened last week.  In the emergency room febrile with temperature 102.3, tachycardic.  WBC 14.3.  Lactic acid 3.1.  Covid negative.  Chest x-ray with left basal opacity likely atelectasis.  Cultures taken and started on antibiotics.   Assessment & Plan:   Principal Problem:   Sepsis ([place]) Active Problems:   Cellulitis of left lower extremity   Rheumatoid arthritis ([place])   Diabetes mellitus without complication (HCC)   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HCC)   Long term (current) use of anticoagulants   Morbid obesity with BMI of 50.0-59.9, adult ([place])  Sepsis with endorgan dysfunction, present on admission. Likely source leg cellulitis or UTI. Blood cultures negative so far, coag negative Staphylococcus 1/4 not significant. Urine culture with mixed organism, repeat today. Significant cellulitis on the left leg, probably responsible for above symptoms.  Continue Rocephin and azithromycin today until clinical improvement. No open wound, local skin care, elevation of the leg and compression stockings. Due to significant symptoms, will monitor today on IV antibiotics.  Type 2 diabetes with hyperglycemia, untreated or undertreated. Patient has diabetes for more than 10 years.  She takes some over-the-counter herbal medications.  A1c is 9.9.  We discussed in detail about starting medications, diet and lifestyle modifications. Patient is not agreeable to start any treatment for her diabetes.  She states she will control it to herself. I  recommended to continue follow-up with her primary care physician. While she is in the hospital, I will start her on sliding scale insulin and frequent blood sugar check to see how her blood sugars are.  Blood glucose on her serology is 175.  Rheumatoid arthritis: On prednisone and methotrexate.  Continue prednisone.  She will take methotrexate on discharge.  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  She wants to discuss this with her regular provider.  Missed doses of Coumadin when she was sick.  INR 1.6.  No indication for bridging.  Continue to redose Coumadin.  Morbid obesity:Body mass index is 53.08 kg/m.  Patient is well aware about lifestyle modifications and weight loss.   DVT prophylaxis:  warfarin (COUMADIN) tablet 5 mg   Code Status: full code  Family Communication: none  Disposition Plan: Status is: Inpatient  Remains inpatient appropriate because:IV treatments appropriate due to intensity of illness or inability to take PO and Inpatient level of care appropriate due to severity of illness Patient presented with significant symptoms of sepsis and is responding to therapies.  She will still need IV antibiotics and monitoring in the hospital.  Anticipate hospitalization more than 2 midnights.  Dispo: The patient is from: Home              Anticipated d/c is to: Home              Anticipated d/c date is: 2 days              Patient currently is not medically stable to d/c.         Consultants:   Wound care  Procedures:  None  Antimicrobials:  Antibiotics Given (last 72 hours)    Date/Time Action Medication Dose Rate   12/28/19 1858 New Bag/Given   cefTRIAXone (ROCEPHIN) 2 g in sodium chloride 0.9 % 100 mL IVPB 2 g 200 mL/hr   12/28/19 1948 New Bag/Given   azithromycin (ZITHROMAX) 500 mg in sodium chloride 0.9 % 250 mL IVPB 500 mg 250 mL/hr   12/29/19 2036 New Bag/Given   cefTRIAXone (ROCEPHIN) 2 g in sodium chloride 0.9  % 100 mL IVPB 2 g 200 mL/hr   12/29/19 2039 Given   azithromycin (ZITHROMAX) tablet 500 mg 500 mg    12/30/19 0949 Given   azithromycin (ZITHROMAX) tablet 500 mg 500 mg          Subjective: Patient seen and examined.  We discussed in detail about her uncontrolled diabetes. Low-grade temperature, T-max 100 over last 24 hours. Denies any chest pain or shortness of breath or cough.  Had minimal urinary symptoms but improved now. Patient was applying a tight bandage over her left leg causing swelling.  Leg hurts on ambulation.  Objective: Vitals:   12/29/19 1414 12/29/19 1447 12/29/19 2114 12/30/19 0253  BP: 127/68  (!) 113/51 111/65  Pulse: 92  95 88  Resp: 17  18 15   Temp: 99.1 F (37.3 C)  98.3 F (36.8 C) 98.7 F (37.1 C)  TempSrc: Oral  Oral Oral  SpO2: 97% 97% 96% 92%  Weight:      Height:        Intake/Output Summary (Last 24 hours) at 12/30/2019 1401 Last data filed at 12/30/2019 0400 Gross per 24 hour  Intake 100 ml  Output 450 ml  Net -350 ml   Filed Weights   12/28/19 1627  Weight: (!) 144.7 kg    Examination:  General exam: Appears calm and comfortable  Respiratory system: Clear to auscultation. Respiratory effort normal. Cardiovascular system: S1 & S2 heard, RRR.  Gastrointestinal system: Obese and pendulous.   Central nervous system: Alert and oriented. No focal neurological deficits. Extremities: Symmetric 5 x 5 power. Skin: No rashes, lesions or ulcers Psychiatry: Judgement and insight appear normal. Mood & affect appropriate.  Left leg with chronic stasis changes. Healing small scab on the lateral aspect. Edematous leg above and below the dressing that she applied herself which is very tight and tourniquet like. No palpable abscess.  Data Reviewed: I have personally reviewed following labs and imaging studies  CBC: Recent Labs  Lab 12/28/19 1719 12/29/19 0659 12/30/19 0821  WBC 18.2* 14.3* 10.1  NEUTROABS 16.2* 12.0* 8.2*  HGB 13.2 11.9*  10.9*  HCT 40.4 36.3 33.5*  MCV 102.8* 102.3* 102.4*  PLT 149* 120* 094*   Basic Metabolic Panel: Recent Labs  Lab 12/28/19 1719 12/29/19 0659 12/30/19 1131  NA 135 138 137  K 3.5 3.5 3.1*  CL 99 105 106  CO2 20* 24 23  GLUCOSE 279* 165* 175*  BUN 18 15 11   CREATININE 1.45* 0.87 0.58  CALCIUM 8.0* 7.3* 7.5*  MG  --   --  1.8   GFR: Estimated Creatinine Clearance: 108.7 mL/min (by C-G formula based on SCr of 0.58 mg/dL). Liver Function Tests: Recent Labs  Lab 12/28/19 1719 12/29/19 0659  AST 50* 46*  ALT 24 24  ALKPHOS 129* 108  BILITOT 1.5* 1.2  PROT 6.8 5.9*  ALBUMIN 3.3* 2.6*   No results for input(s): LIPASE, AMYLASE in the last 168 hours. No results for input(s): AMMONIA in the last 168 hours. Coagulation Profile:  Recent Labs  Lab 12/28/19 1719 12/30/19 0148  INR 1.8* 1.6*   Cardiac Enzymes: No results for input(s): CKTOTAL, CKMB, CKMBINDEX, TROPONINI in the last 168 hours. BNP (last 3 results) No results for input(s): PROBNP in the last 8760 hours. HbA1C: Recent Labs    12/30/19 0148  HGBA1C 9.9*   CBG: No results for input(s): GLUCAP in the last 168 hours. Lipid Profile: No results for input(s): CHOL, HDL, LDLCALC, TRIG, CHOLHDL, LDLDIRECT in the last 72 hours. Thyroid Function Tests: No results for input(s): TSH, T4TOTAL, FREET4, T3FREE, THYROIDAB in the last 72 hours. Anemia Panel: Recent Labs    12/30/19 0148  VITAMINB12 719  FOLATE 10.6   Sepsis Labs: Recent Labs  Lab 12/28/19 1719 12/28/19 2056 12/29/19 0700  LATICACIDVEN 6.1* 3.1* 1.3    Recent Results (from the past 240 hour(s))  SARS Coronavirus 2 by RT PCR (hospital order, performed in [facility] hospital lab) Nasopharyngeal Nasopharyngeal Swab     Status: None   Collection Time: 12/28/19  5:24 PM   Specimen: Nasopharyngeal Swab  Result Value Ref Range Status   SARS Coronavirus 2 NEGATIVE NEGATIVE Final    Comment: (NOTE) SARS-CoV-2 target nucleic acids are NOT  DETECTED.  The SARS-CoV-2 RNA is generally detectable in upper and lower respiratory specimens during the acute phase of infection. The lowest concentration of SARS-CoV-2 viral copies this assay can detect is 250 copies / mL. A negative result does not preclude SARS-CoV-2 infection and should not be used as the sole basis for treatment or other patient management decisions.  A negative result may occur with improper specimen collection / handling, submission of specimen other than nasopharyngeal swab, presence of viral mutation(s) within the areas targeted by this assay, and inadequate number of viral copies (<250 copies / mL). A negative result must be combined with clinical observations, patient history, and epidemiological information.  Fact Sheet for Patients:   StrictlyIdeas.no  Fact Sheet for Healthcare Providers: BankingDealers.co.za  This test is not yet approved or  cleared by the [place] FDA and has been authorized for detection and/or diagnosis of SARS-CoV-2 by FDA under an Emergency Use Authorization (EUA).  This EUA will remain in effect (meaning this test can be used) for the duration of the COVID-19 declaration under Section 564(b)(1) of the Act, 21 U.S.C. section 360bbb-3(b)(1), unless the authorization is terminated or revoked sooner.  Performed at [facility], [address] [place], [place] [address]   Blood culture (routine x 2)     Status: Abnormal (Preliminary result)   Collection Time: 12/28/19  6:24 PM   Specimen: BLOOD  Result Value Ref Range Status   Specimen Description   Final    BLOOD RIGHT ANTECUBITAL Performed at [facility], [address] [place], [place] [address]    Special Requests   Final    BOTTLES DRAWN AEROBIC AND ANAEROBIC Blood Culture results may not be optimal due to an inadequate volume of blood received in culture bottles Performed at [facility], [address] [place], [place] [address]    Culture  Setup Time   Final    AEROBIC BOTTLE ONLY GRAM POSITIVE COCCI IN CLUSTERS Organism ID to follow CRITICAL RESULT CALLED TO, READ BACK BY AND VERIFIED WITH: L SEAY PHARMD 12/30/19 0056 JDW    Culture (A)  Final    STAPHYLOCOCCUS SPECIES (COAGULASE NEGATIVE) THE SIGNIFICANCE OF ISOLATING THIS ORGANISM FROM A SINGLE SET OF BLOOD CULTURES  WHEN MULTIPLE SETS ARE DRAWN IS UNCERTAIN. PLEASE NOTIFY THE MICROBIOLOGY DEPARTMENT WITHIN ONE WEEK IF SPECIATION AND SENSITIVITIES ARE REQUIRED. Performed at [facility], [place] [address], [place], [place] [address]    Report Status PENDING  Incomplete  Urine culture     Status: Abnormal   Collection Time: 12/28/19  6:24 PM   Specimen: Urine, Random  Result Value Ref Range Status   Specimen Description   Final    URINE, RANDOM Performed at [facility], [address] [place], [place] [address]    Special Requests   Final    NONE Performed at [facility], [address] [place], [place] [address]    Culture MULTIPLE SPECIES PRESENT, SUGGEST RECOLLECTION (A)  Final   Report Status 12/30/2019 FINAL  Final  Blood Culture ID Panel (Reflexed)     Status: Abnormal   Collection Time: 12/28/19  6:24 PM  Result Value Ref Range Status   Enterococcus species NOT DETECTED NOT DETECTED Final   Listeria monocytogenes NOT DETECTED NOT DETECTED Final   Staphylococcus species DETECTED (A) NOT DETECTED Final    Comment: Methicillin (oxacillin) susceptible coagulase negative staphylococcus. Possible blood culture contaminant (unless isolated from more than one blood culture draw or clinical case suggests pathogenicity). No antibiotic treatment is indicated for blood  culture contaminants. CRITICAL RESULT CALLED TO, READ BACK BY AND VERIFIED WITH: L SEAY PHARMD 12/30/19 0056 JDW    Staphylococcus aureus (BCID) NOT DETECTED NOT DETECTED Final   Methicillin resistance NOT  DETECTED NOT DETECTED Final   Streptococcus species NOT DETECTED NOT DETECTED Final   Streptococcus agalactiae NOT DETECTED NOT DETECTED Final   Streptococcus pneumoniae NOT DETECTED NOT DETECTED Final   Streptococcus pyogenes NOT DETECTED NOT DETECTED Final   Acinetobacter baumannii NOT DETECTED NOT DETECTED Final   Enterobacteriaceae species NOT DETECTED NOT DETECTED Final   Enterobacter cloacae complex NOT DETECTED NOT DETECTED Final   Escherichia coli NOT DETECTED NOT DETECTED Final   Klebsiella oxytoca NOT DETECTED NOT DETECTED Final   Klebsiella pneumoniae NOT DETECTED NOT DETECTED Final   Proteus species NOT DETECTED NOT DETECTED Final   Serratia marcescens NOT DETECTED NOT DETECTED Final   Haemophilus influenzae NOT DETECTED NOT DETECTED Final   Neisseria meningitidis NOT DETECTED NOT DETECTED Final   Pseudomonas aeruginosa NOT DETECTED NOT DETECTED Final   Candida albicans NOT DETECTED NOT DETECTED Final   Candida glabrata NOT DETECTED NOT DETECTED Final   Candida krusei NOT DETECTED NOT DETECTED Final   Candida parapsilosis NOT DETECTED NOT DETECTED Final   Candida tropicalis NOT DETECTED NOT DETECTED Final    Comment: Performed at [facility], [address], [place], [place] [address]  Blood culture (routine x 2)     Status: None (Preliminary result)   Collection Time: 12/28/19  6:58 PM   Specimen: BLOOD  Result Value Ref Range Status   Specimen Description   Final    BLOOD BLOOD RIGHT FOREARM Performed at [facility], [address] [place], [place] [address]    Special Requests   Final    BOTTLES DRAWN AEROBIC AND ANAEROBIC Blood Culture adequate volume Performed at [facility], [address] [place], [place] [address]    Culture   Final    NO GROWTH 2 DAYS Performed at [facility], [place] [address], [place], [place] [address]    Report Status PENDING  Incomplete  Radiology Studies: DG Chest 2 View  Result  Date: 12/28/2019 CLINICAL DATA:  Suspected sepsis. Additional provided: Shortness of breath, chills. History of hypertension, diabetes EXAM: CHEST - 2 VIEW COMPARISON:  Prior chest radiographs 03/12/2017 and earlier FINDINGS: Heart size within normal limits. Elevation of the left hemidiaphragm. An opacity is demonstrated within the left lung base on lateral view radiograph. No evidence of pleural effusion or pneumothorax. No acute bony abnormality. IMPRESSION: Elevation of the left hemidiaphragm. A left lower lobe opacity seen on the lateral view radiograph may reflect compressive atelectasis or pneumonia. The right lung is clear. Electronically Signed   By: Kellie Simmering DO   On: 12/28/2019 17:08        Scheduled Meds: . azithromycin  500 mg Oral Daily  . folic acid  2 mg Oral Daily  . gabapentin  400 mg Oral QHS  . insulin aspart  0-15 Units Subcutaneous TID WC  . insulin aspart  0-5 Units Subcutaneous QHS  . lactobacillus acidophilus & bulgar  1 tablet Oral TID WC  . magnesium oxide  800 mg Oral BID  . potassium chloride  40 mEq Oral BID  . predniSONE  1 mg Oral Q breakfast  . warfarin  5 mg Oral ONCE-1600  . Warfarin - Pharmacist Dosing Inpatient   Does not apply q1600   Continuous Infusions: . cefTRIAXone (ROCEPHIN)  IV 2 g (12/29/19 2036)     LOS: 0 days    Time spent: 35 minutes    Barb Merino, MD [facility] Pager [phone]

## 2019-12-30 NOTE — Discharge Instructions (Signed)

## 2019-12-30 NOTE — Consult Note (Signed)
WOC Nurse Consult Note: Patient receiving care in [facility].  I spoke with her primary RN, Louie Casa, by telephone after reviewing her record. Reason for Consult: "wound of the right leg".  Louie Casa reports the LLE is in a coban wrap and the wound is on the LLE, not the right.  I have placed an order in to remove the wrap, wash the leg with soap and water, apply Sween Moisturizer, and a foam dressing over any wound.  I will come and evaluate the leg today. Wound type: Pressure Injury POA: Yes/No/NA Measurement: Wound bed: Drainage (amount, consistency, odor)  Periwound: Dressing procedure/placement/frequency: Thank you for the consult.  Discussed plan of care with the beside nurse. Val Riles, RN, MSN, CWOCN, CNS-BC, pager [phone]

## 2019-12-30 NOTE — Plan of Care (Signed)

## 2019-12-30 NOTE — Progress Notes (Signed)
PHARMACY - PHYSICIAN COMMUNICATION CRITICAL VALUE ALERT - BLOOD CULTURE IDENTIFICATION (BCID)  Brittany Tran is an 59 y.o. female who presented to [facility] on 12/28/2019 with a chief complaint of chills  Assessment: 1/4 BC with Staph species  Name of physician (or Provider) Contacted: Ardith Dark  Current antibiotics: rocephin and azithromycin  Changes to prescribed antibiotics recommended:  Could be contaminant.  No changes for now  Results for orders placed or performed during the hospital encounter of 12/28/19  Blood Culture ID Panel (Reflexed) (Collected: 12/28/2019  6:24 PM)  Result Value Ref Range   Enterococcus species NOT DETECTED NOT DETECTED   Listeria monocytogenes NOT DETECTED NOT DETECTED   Staphylococcus species DETECTED (A) NOT DETECTED   Staphylococcus aureus (BCID) NOT DETECTED NOT DETECTED   Methicillin resistance NOT DETECTED NOT DETECTED   Streptococcus species NOT DETECTED NOT DETECTED   Streptococcus agalactiae NOT DETECTED NOT DETECTED   Streptococcus pneumoniae NOT DETECTED NOT DETECTED   Streptococcus pyogenes NOT DETECTED NOT DETECTED   Acinetobacter baumannii NOT DETECTED NOT DETECTED   Enterobacteriaceae species NOT DETECTED NOT DETECTED   Enterobacter cloacae complex NOT DETECTED NOT DETECTED   Escherichia coli NOT DETECTED NOT DETECTED   Klebsiella oxytoca NOT DETECTED NOT DETECTED   Klebsiella pneumoniae NOT DETECTED NOT DETECTED   Proteus species NOT DETECTED NOT DETECTED   Serratia marcescens NOT DETECTED NOT DETECTED   Haemophilus influenzae NOT DETECTED NOT DETECTED   Neisseria meningitidis NOT DETECTED NOT DETECTED   Pseudomonas aeruginosa NOT DETECTED NOT DETECTED   Candida albicans NOT DETECTED NOT DETECTED   Candida glabrata NOT DETECTED NOT DETECTED   Candida krusei NOT DETECTED NOT DETECTED   Candida parapsilosis NOT DETECTED NOT DETECTED   Candida tropicalis NOT DETECTED NOT DETECTED    Beverlee Nims 12/30/2019  12:59 AM

## 2019-12-30 NOTE — Progress Notes (Signed)
ANTICOAGULATION CONSULT NOTE - Follow Up Consult  Pharmacy Consult for Warfarin Indication: hx DVT  Allergies  Allergen Reactions  . Penicillins Hives    Has patient had a PCN reaction causing immediate rash, facial/tongue/throat swelling, SOB or lightheadedness with hypotension: Yes Has patient had a PCN reaction causing severe rash involving mucus membranes or skin necrosis: No Has patient had a PCN reaction that required hospitalization: No Has patient had a PCN reaction occurring within the last 10 years: No If all of the above answers are "NO", then may proceed with Cephalosporin use.   . Vancomycin Rash    Developed a red rash bilaterally on her arms, legs, neck, chest and abdomen  . Sulfa Antibiotics Hives    Patient Measurements: Height: 5\' 5"  (165.1 cm) Weight: (!) 144.7 kg (319 lb) IBW/kg (Calculated) : 57  Vital Signs: Temp: 98.7 F (37.1 C) (07/15 0253) Temp Source: Oral (07/15 0253) BP: 111/65 (07/15 0253) Pulse Rate: 88 (07/15 0253)  Labs: Recent Labs    12/28/19 1719 12/28/19 1719 12/29/19 0659 12/30/19 0148 12/30/19 0821  HGB 13.2   < > 11.9*  --  10.9*  HCT 40.4  --  36.3  --  33.5*  PLT 149*  --  120*  --  128*  APTT 36  --   --   --   --   LABPROT 20.0*  --   --  18.3*  --   INR 1.8*  --   --  1.6*  --   CREATININE 1.45*  --  0.87  --   --    < > = values in this interval not displayed.    Estimated Creatinine Clearance: 100 mL/min (by C-G formula based on SCr of 0.87 mg/dL).  Assessment:  59 yr old female on Warfarin as PTA for hx DVT.   INR subtherapeutic (1.8) on admit 7/13 pm.  Last outpatient INR was therapeutic (2/4) on 12/08/19.   INR remains subtherapeutic (1.6); received boosted dose of 5 mg on 7/14.  She reports last DVT 2011.  Missed dose on 7/13 d/t at [facility] that afternoon and in ED overnight.    PTA regimen: 2.5 mg daily except 5 mg on Tues/Thurs.   Day #3 Ceftriaxone and Azithromycin for sepsis d/t CAP as well as LLE cellulitis  and UTI coverage.   Goal of Therapy:  INR 2-3 Monitor platelets by anticoagulation protocol: Yes   Plan:   Warfarin 5 mg again today.  Daily PT/INR  Watch for possible Azithromycin effect on INR.  Arty Baumgartner, [place] Phone: [phone] 12/30/2019,12:31 PM

## 2019-12-30 NOTE — Consult Note (Signed)
WOC Nurse Consult Note: Reason for Consult: leg wound Wound type: Left lower leg, lateral aspect, has a barely discernable wound. Pressure Injury POA: Yes/No/NA Measurement: 0.3 cm x 0.2 cm Wound bed: pink Drainage (amount, consistency, odor) none Periwound: erythematous LLE from knee to toes with edema, very warm to touch. Appearance suggestive of cellulitis.  Patient states the leg is very painful when in a dependent position, walking, sitting, standing. Dressing procedure/placement/frequency: Place a very small foam dressing over the left lateral leg wound area. Starting behind the toes and going to below the knee, spiral wrap kerlex, then a 4 inch Ace Bandage. Perform daily.   Patient tells me she has been wrapping snuggly the area JUST where the wound is.  This explains the edema above the gaiter area and below it.  We talked about the dangers of wraps in this manner.  I encouraged her to avoid doing so in the future.  I further encouraged her to wear her compression socks when she returns home.    Monitor the wound area(s) for worsening of condition such as: Signs/symptoms of infection,  Increase in size,  Development of or worsening of odor, Development of pain, or increased pain at the affected locations.  Notify the medical team if any of these develop.  Thank you for the consult.  Discussed plan of care with the patient.  [place] nurse will not follow at this time.  Please re-consult the [place] team if needed.  Val Riles, RN, MSN, CWOCN, CNS-BC, pager [phone]

## 2019-12-31 LAB — CULTURE, BLOOD (ROUTINE X 2)

## 2019-12-31 LAB — LEGIONELLA PNEUMOPHILA SEROGP 1 UR AG: L. pneumophila Serogp 1 Ur Ag: NEGATIVE

## 2019-12-31 LAB — URINE CULTURE: Culture: NO GROWTH

## 2019-12-31 LAB — GLUCOSE, CAPILLARY
Glucose-Capillary: 105 mg/dL — ABNORMAL HIGH (ref 70–99)
Glucose-Capillary: 128 mg/dL — ABNORMAL HIGH (ref 70–99)

## 2019-12-31 LAB — PROTIME-INR
INR: 1.6 — ABNORMAL HIGH (ref 0.8–1.2)
Prothrombin Time: 18.7 seconds — ABNORMAL HIGH (ref 11.4–15.2)

## 2019-12-31 MED ORDER — AZITHROMYCIN 500 MG PO TABS: 500.0000 mg | ORAL_TABLET | Freq: Every day | ORAL | 0 refills | Status: AC

## 2019-12-31 MED ORDER — CEPHALEXIN 500 MG PO CAPS: 500.0000 mg | ORAL_CAPSULE | Freq: Three times a day (TID) | ORAL | 0 refills | Status: AC

## 2019-12-31 NOTE — Plan of Care (Signed)
  Problem: Education: Goal: Knowledge of General Education information will improve Description: Including pain rating scale, medication(s)/side effects and non-pharmacologic comfort measures 12/31/2019 0842 by Melina Schools, RN Outcome: Adequate for Discharge 12/31/2019 0841 by Melina Schools, RN Outcome: Progressing   Problem: Health Behavior/Discharge Planning: Goal: Ability to manage health-related needs will improve Outcome: Adequate for Discharge   Problem: Clinical Measurements: Goal: Ability to maintain clinical measurements within normal limits will improve Outcome: Adequate for Discharge Goal: Will remain free from infection Outcome: Adequate for Discharge Goal: Diagnostic test results will improve Outcome: Adequate for Discharge Goal: Respiratory complications will improve Outcome: Adequate for Discharge Goal: Cardiovascular complication will be avoided Outcome: Adequate for Discharge   Problem: Activity: Goal: Risk for activity intolerance will decrease Outcome: Adequate for Discharge   Problem: Nutrition: Goal: Adequate nutrition will be maintained Outcome: Adequate for Discharge   Problem: Coping: Goal: Level of anxiety will decrease Outcome: Adequate for Discharge   Problem: Elimination: Goal: Will not experience complications related to bowel motility Outcome: Adequate for Discharge Goal: Will not experience complications related to urinary retention Outcome: Adequate for Discharge   Problem: Pain Managment: Goal: General experience of comfort will improve 12/31/2019 0842 by Melina Schools, RN Outcome: Adequate for Discharge 12/31/2019 0841 by Melina Schools, RN Outcome: Progressing   Problem: Safety: Goal: Ability to remain free from injury will improve 12/31/2019 0842 by Melina Schools, RN Outcome: Adequate for Discharge 12/31/2019 0841 by Melina Schools, RN Outcome: Progressing   Problem: Skin Integrity: Goal: Risk for  impaired skin integrity will decrease Outcome: Adequate for Discharge

## 2019-12-31 NOTE — Discharge Summary (Signed)
Physician Discharge Summary  Brittany Tran BTD:[id number] DOB: 1961/03/13 DOA: 12/28/2019  PCP: Curt Jews, PA-C  Admit date: 12/28/2019 Discharge date: 12/31/2019  Admitted From: Home Disposition: Home  Recommendations for Outpatient Follow-up:  1. Follow up with PCP in 1-2 weeks, discussed about diabetic treatment.   Discharge Condition: Stable CODE STATUS: Full code Diet recommendation: Low-salt and low-carb diet  Discharge summary: 59 year old female history of hypertension, longstanding type 2 diabetes taking herbal medications, rheumatoid arthritis, fibromyalgia, history of recurrent DVT on Coumadin presented to the emergency room with feeling unwell, short of breath, body aches chills and headache.  She also had developed a wound on her left leg that opened last week.  In the emergency room febrile with temperature 102.3, tachycardic.  WBC 14.3.  Lactic acid 3.1.  Covid negative.  Chest x-ray with left basal opacity likely atelectasis.  Cultures taken and started on antibiotics.  #1 . sepsis with endorgan dysfunction, present on admission. Likely source leg cellulitis.  Blood cultures negative.  1/4 coag negative Staphylococcus is contaminant. Urine culture with multiple organisms, currently asymptomatic, recultures are collected. Leg cellulitis is already improving. Patient with clinical improvement, will be discharged home on 5 more days of Keflex by mouth and 2 more days of azithromycin. She will clean her legs, moisturize and apply compression stockings to avoid edema.  #2. Type 2 diabetes with hyperglycemia, untreated or undertreated. Patient has diabetes for more than 10 years.  She takes some over-the-counter herbal medications.  A1c is 9.9.  We discussed in detail about starting medications, diet and lifestyle modifications. Patient is not agreeable to start any treatment for her diabetes.  She states she will control it to herself. I recommended to continue follow-up  with her primary care physician.  #3. Rheumatoid arthritis: On prednisone and methotrexate.  Continue prednisone.  She will take methotrexate on discharge.  #4. 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  She wants to discuss this with her regular provider.  Missed doses of Coumadin when she was sick.  INR is 1.6.  Last episode of DVT was in 2012.  No indication for bridging.  She will continue to take Coumadin at home.  #5. Morbid obesity:Body mass index is 53.08 kg/m.  Patient is well aware about lifestyle modifications and weight loss.  Patient is medically stable.  We discussed in detail about needing for treatment of her diabetes and patient adamantly refused for me to prescribe her any treatment plan.  We discussed about diet modification and lifestyle changes and she agrees..  Discharge Diagnoses:  Principal Problem:   Sepsis ([place]) Active Problems:   Cellulitis of left lower extremity   Rheumatoid arthritis ([place])   Diabetes mellitus without complication (HCC)   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HCC)   Long term (current) use of anticoagulants   Morbid obesity with BMI of 50.0-59.9, adult [facility])    Discharge Instructions  Discharge Instructions    Diet - low sodium heart healthy   Complete by: As directed    Discharge wound care:   Complete by: As directed    Clean with soap and water, moisturizer and compression stockings   Increase activity slowly   Complete by: As directed    No wound care   Complete by: As directed      Allergies as of 12/31/2019      Reactions   Penicillins Hives   Has patient had a PCN reaction causing immediate rash, facial/tongue/throat swelling,  SOB or lightheadedness with hypotension: Yes Has patient had a PCN reaction causing severe rash involving mucus membranes or skin necrosis: No Has patient had a PCN reaction that required hospitalization: No Has patient had a PCN reaction occurring within the  last 10 years: No If all of the above answers are "NO", then may proceed with Cephalosporin use.   Vancomycin Rash   Developed a red rash bilaterally on her arms, legs, neck, chest and abdomen   Sulfa Antibiotics Hives      Medication List    STOP taking these medications   lactobacillus acidophilus & bulgar chewable tablet     TAKE these medications   acetaminophen 325 MG tablet Commonly known as: TYLENOL Take 2 tablets (650 mg total) by mouth every 6 (six) hours as needed for mild pain or fever.   azithromycin 500 MG tablet Commonly known as: ZITHROMAX Take 1 tablet (500 mg total) by mouth daily for 3 days.   cephALEXin 500 MG capsule Commonly known as: KEFLEX Take 1 capsule (500 mg total) by mouth 3 (three) times daily for 5 days.   folic acid 1 MG tablet Commonly known as: FOLVITE Take 2 mg by mouth daily.   gabapentin 100 MG capsule Commonly known as: NEURONTIN Take 400 mg by mouth at bedtime.   glucosamine-chondroitin 500-400 MG tablet Take 1 tablet by mouth 2 (two) times daily.   glucose blood test strip Commonly known as: True Metrix Blood Glucose Test Use as instructed   methotrexate 2.5 MG tablet Commonly known as: RHEUMATREX Take 25 mg by mouth once a week.   oxyCODONE-acetaminophen 5-325 MG tablet Commonly known as: PERCOCET/ROXICET Take 1-2 tablets by mouth every 6 (six) hours as needed for moderate pain.   predniSONE 1 MG tablet Commonly known as: DELTASONE Take 1 mg by mouth daily with breakfast.   triamcinolone 0.025 % ointment Commonly known as: KENALOG Apply 1 application topically See admin instructions. Apply to periwound with dressing changes.   True Metrix Air Glucose Meter Devi 1 strip by Does not apply route 3 (three) times daily before meals.   TRUEplus Lancets 28G Misc Use to check blood sugar daily.   warfarin 5 MG tablet Commonly known as: COUMADIN Take 1 tablet (5 mg total) by mouth See admin instructions. Take 2.5 mg by  mouth every other day alternating with 5 mg by mouth every other day What changed:   how much to take  additional instructions            Discharge Care Instructions  (From admission, onward)         Start     Ordered   12/31/19 0000  Discharge wound care:       Comments: Clean with soap and water, moisturizer and compression stockings   12/31/19 0905          Allergies  Allergen Reactions  . Penicillins Hives    Has patient had a PCN reaction causing immediate rash, facial/tongue/throat swelling, SOB or lightheadedness with hypotension: Yes Has patient had a PCN reaction causing severe rash involving mucus membranes or skin necrosis: No Has patient had a PCN reaction that required hospitalization: No Has patient had a PCN reaction occurring within the last 10 years: No If all of the above answers are "NO", then may proceed with Cephalosporin use.   . Vancomycin Rash    Developed a red rash bilaterally on her arms, legs, neck, chest and abdomen  . Sulfa Antibiotics Hives  Procedures/Studies: DG Chest 2 View  Result Date: 12/28/2019 CLINICAL DATA:  Suspected sepsis. Additional provided: Shortness of breath, chills. History of hypertension, diabetes EXAM: CHEST - 2 VIEW COMPARISON:  Prior chest radiographs 03/12/2017 and earlier FINDINGS: Heart size within normal limits. Elevation of the left hemidiaphragm. An opacity is demonstrated within the left lung base on lateral view radiograph. No evidence of pleural effusion or pneumothorax. No acute bony abnormality. IMPRESSION: Elevation of the left hemidiaphragm. A left lower lobe opacity seen on the lateral view radiograph may reflect compressive atelectasis or pneumonia. The right lung is clear. Electronically Signed   By: Kellie Simmering DO   On: 12/28/2019 17:08   (Echo, Carotid, EGD, Colonoscopy, ERCP)    Subjective: Patient seen and examined.  No overnight events.  She is up about and wants to go home.  Minimum left  leg discomfort, however walking around.  Swelling has improved.  Denies any urinary symptoms today.  No cough or sputum production.   Discharge Exam: Vitals:   12/31/19 0517 12/31/19 0756  BP: 115/66 138/89  Pulse: 89 89  Resp: 16 17  Temp: 98.7 F (37.1 C) 98.4 F (36.9 C)  SpO2: 97% 94%   Vitals:   12/30/19 1615 12/30/19 1953 12/31/19 0517 12/31/19 0756  BP: 125/82 125/72 115/66 138/89  Pulse: 89 88 89 89  Resp: 19 16 16 17   Temp: 98.6 F (37 C) 99.1 F (37.3 C) 98.7 F (37.1 C) 98.4 F (36.9 C)  TempSrc:  Oral Oral Oral  SpO2: 95% 97% 97% 94%  Weight:      Height:        General: Pt is alert, awake, not in acute distress, on room air.  Comfortable. Cardiovascular: RRR, S1/S2 +, no rubs, no gallops Respiratory: CTA bilaterally, no wheezing, no rhonchi, no added sounds. Abdominal: Soft, NT, ND, bowel sounds +, obese and pendulous.  Nontender. Extremities: Bilateral leg with chronic skin changes and pigmentation, minimum swelling on the left ankle.  No evidence of fluctuation or underlying induration.  No open wounds.    The results of significant diagnostics from this hospitalization (including imaging, microbiology, ancillary and laboratory) are listed below for reference.     Microbiology: Recent Results (from the past 240 hour(s))  SARS Coronavirus 2 by RT PCR (hospital order, performed in [facility] hospital lab) Nasopharyngeal Nasopharyngeal Swab     Status: None   Collection Time: 12/28/19  5:24 PM   Specimen: Nasopharyngeal Swab  Result Value Ref Range Status   SARS Coronavirus 2 NEGATIVE NEGATIVE Final    Comment: (NOTE) SARS-CoV-2 target nucleic acids are NOT DETECTED.  The SARS-CoV-2 RNA is generally detectable in upper and lower respiratory specimens during the acute phase of infection. The lowest concentration of SARS-CoV-2 viral copies this assay can detect is 250 copies / mL. A negative result does not preclude SARS-CoV-2 infection and should not  be used as the sole basis for treatment or other patient management decisions.  A negative result may occur with improper specimen collection / handling, submission of specimen other than nasopharyngeal swab, presence of viral mutation(s) within the areas targeted by this assay, and inadequate number of viral copies (<250 copies / mL). A negative result must be combined with clinical observations, patient history, and epidemiological information.  Fact Sheet for Patients:   StrictlyIdeas.no  Fact Sheet for Healthcare Providers: BankingDealers.co.za  This test is not yet approved or  cleared by the [place] FDA and has been authorized for detection and/or  diagnosis of SARS-CoV-2 by FDA under an Emergency Use Authorization (EUA).  This EUA will remain in effect (meaning this test can be used) for the duration of the COVID-19 declaration under Section 564(b)(1) of the Act, 21 U.S.C. section 360bbb-3(b)(1), unless the authorization is terminated or revoked sooner.  Performed at [facility], [address] [place], [place] [address]   Blood culture (routine x 2)     Status: Abnormal   Collection Time: 12/28/19  6:24 PM   Specimen: BLOOD  Result Value Ref Range Status   Specimen Description   Final    BLOOD RIGHT ANTECUBITAL Performed at [facility], [address] [place], [place] [address]    Special Requests   Final    BOTTLES DRAWN AEROBIC AND ANAEROBIC Blood Culture results may not be optimal due to an inadequate volume of blood received in culture bottles Performed at [facility], [address] [place], [place] [address]    Culture  Setup Time   Final    AEROBIC BOTTLE ONLY GRAM POSITIVE COCCI IN CLUSTERS CRITICAL RESULT CALLED TO, READ BACK BY AND VERIFIED WITH: L SEAY PHARMD 12/30/19 0056 JDW    Culture (A)  Final    STAPHYLOCOCCUS EPIDERMIDIS THE SIGNIFICANCE OF ISOLATING THIS  ORGANISM FROM A SINGLE SET OF BLOOD CULTURES WHEN MULTIPLE SETS ARE DRAWN IS UNCERTAIN. PLEASE NOTIFY THE MICROBIOLOGY DEPARTMENT WITHIN ONE WEEK IF SPECIATION AND SENSITIVITIES ARE REQUIRED. Performed at [facility], [place] [address], [place], [place] [address]    Report Status 12/31/2019 FINAL  Final  Urine culture     Status: Abnormal   Collection Time: 12/28/19  6:24 PM   Specimen: Urine, Random  Result Value Ref Range Status   Specimen Description   Final    URINE, RANDOM Performed at [facility], [address] [place], [place] [address]    Special Requests   Final    NONE Performed at [facility], [address] [place], [place] [address]    Culture MULTIPLE SPECIES PRESENT, SUGGEST RECOLLECTION (A)  Final   Report Status 12/30/2019 FINAL  Final  Blood Culture ID Panel (Reflexed)     Status: Abnormal   Collection Time: 12/28/19  6:24 PM  Result Value Ref Range Status   Enterococcus species NOT DETECTED NOT DETECTED Final   Listeria monocytogenes NOT DETECTED NOT DETECTED Final   Staphylococcus species DETECTED (A) NOT DETECTED Final    Comment: Methicillin (oxacillin) susceptible coagulase negative staphylococcus. Possible blood culture contaminant (unless isolated from more than one blood culture draw or clinical case suggests pathogenicity). No antibiotic treatment is indicated for blood  culture contaminants. CRITICAL RESULT CALLED TO, READ BACK BY AND VERIFIED WITH: L SEAY PHARMD 12/30/19 0056 JDW    Staphylococcus aureus (BCID) NOT DETECTED NOT DETECTED Final   Methicillin resistance NOT DETECTED NOT DETECTED Final   Streptococcus species NOT DETECTED NOT DETECTED Final   Streptococcus agalactiae NOT DETECTED NOT DETECTED Final   Streptococcus pneumoniae NOT DETECTED NOT DETECTED Final   Streptococcus pyogenes NOT DETECTED NOT DETECTED Final   Acinetobacter baumannii NOT DETECTED NOT DETECTED Final   Enterobacteriaceae species NOT DETECTED  NOT DETECTED Final   Enterobacter cloacae complex NOT DETECTED NOT DETECTED Final   Escherichia coli NOT DETECTED NOT DETECTED Final   Klebsiella oxytoca NOT DETECTED NOT DETECTED Final   Klebsiella pneumoniae NOT DETECTED NOT DETECTED Final   Proteus species NOT  DETECTED NOT DETECTED Final   Serratia marcescens NOT DETECTED NOT DETECTED Final   Haemophilus influenzae NOT DETECTED NOT DETECTED Final   Neisseria meningitidis NOT DETECTED NOT DETECTED Final   Pseudomonas aeruginosa NOT DETECTED NOT DETECTED Final   Candida albicans NOT DETECTED NOT DETECTED Final   Candida glabrata NOT DETECTED NOT DETECTED Final   Candida krusei NOT DETECTED NOT DETECTED Final   Candida parapsilosis NOT DETECTED NOT DETECTED Final   Candida tropicalis NOT DETECTED NOT DETECTED Final    Comment: Performed at [facility], [place] [address], [place], [place] [address]  Blood culture (routine x 2)     Status: None (Preliminary result)   Collection Time: 12/28/19  6:58 PM   Specimen: BLOOD  Result Value Ref Range Status   Specimen Description   Final    BLOOD BLOOD RIGHT FOREARM Performed at [facility], [address] [place], [place] [address]    Special Requests   Final    BOTTLES DRAWN AEROBIC AND ANAEROBIC Blood Culture adequate volume Performed at [facility], [address] [place], [place] [address]    Culture  Setup Time   Final    ANAEROBIC BOTTLE ONLY GRAM POSITIVE COCCI IN CLUSTERS CRITICAL VALUE NOTED.  VALUE IS CONSISTENT WITH PREVIOUSLY REPORTED AND CALLED VALUE. Performed at [facility], [place] [address], [place], [place] [address]    Culture GRAM POSITIVE COCCI  Final   Report Status PENDING  Incomplete     Labs: BNP (last 3 results) No results for input(s): BNP in the last 8760 hours. Basic Metabolic Panel: Recent Labs  Lab 12/28/19 1719 12/29/19 0659 12/30/19 1131  NA 135 138 137  K 3.5 3.5 3.1*  CL 99 105 106  CO2 20* 24 23  GLUCOSE  279* 165* 175*  BUN 18 15 11   CREATININE 1.45* 0.87 0.58  CALCIUM 8.0* 7.3* 7.5*  MG  --   --  1.8   Liver Function Tests: Recent Labs  Lab 12/28/19 1719 12/29/19 0659  AST 50* 46*  ALT 24 24  ALKPHOS 129* 108  BILITOT 1.5* 1.2  PROT 6.8 5.9*  ALBUMIN 3.3* 2.6*   No results for input(s): LIPASE, AMYLASE in the last 168 hours. No results for input(s): AMMONIA in the last 168 hours. CBC: Recent Labs  Lab 12/28/19 1719 12/29/19 0659 12/30/19 0821  WBC 18.2* 14.3* 10.1  NEUTROABS 16.2* 12.0* 8.2*  HGB 13.2 11.9* 10.9*  HCT 40.4 36.3 33.5*  MCV 102.8* 102.3* 102.4*  PLT 149* 120* 128*   Cardiac Enzymes: No results for input(s): CKTOTAL, CKMB, CKMBINDEX, TROPONINI in the last 168 hours. BNP: Invalid input(s): POCBNP CBG: Recent Labs  Lab 12/30/19 1640 12/30/19 2057 12/31/19 0637  GLUCAP 175* 138* 105*   D-Dimer No results for input(s): DDIMER in the last 72 hours. Hgb A1c Recent Labs    12/30/19 0148  HGBA1C 9.9*   Lipid Profile No results for input(s): CHOL, HDL, LDLCALC, TRIG, CHOLHDL, LDLDIRECT in the last 72 hours. Thyroid function studies No results for input(s): TSH, T4TOTAL, T3FREE, THYROIDAB in the last 72 hours.  Invalid input(s): FREET3 Anemia work up Recent Labs    12/30/19 0148  VITAMINB12 719  FOLATE 10.6   Urinalysis    Component Value Date/Time   COLORURINE YELLOW 12/28/2019 1824   APPEARANCEUR CLOUDY (A) 12/28/2019 1824   LABSPEC 1.015 12/28/2019 1824   PHURINE 6.5 12/28/2019 1824   GLUCOSEU 250 (A) 12/28/2019 1824  HGBUR LARGE (A) 12/28/2019 1824   BILIRUBINUR NEGATIVE 12/28/2019 1824   KETONESUR 15 (A) 12/28/2019 1824   PROTEINUR 30 (A) 12/28/2019 1824   NITRITE NEGATIVE 12/28/2019 1824   LEUKOCYTESUR MODERATE (A) 12/28/2019 1824   Sepsis Labs Invalid input(s): PROCALCITONIN,  WBC,  LACTICIDVEN Microbiology Recent Results (from the past 240 hour(s))  SARS Coronavirus 2 by RT PCR (hospital order, performed in [facility]  hospital lab) Nasopharyngeal Nasopharyngeal Swab     Status: None   Collection Time: 12/28/19  5:24 PM   Specimen: Nasopharyngeal Swab  Result Value Ref Range Status   SARS Coronavirus 2 NEGATIVE NEGATIVE Final    Comment: (NOTE) SARS-CoV-2 target nucleic acids are NOT DETECTED.  The SARS-CoV-2 RNA is generally detectable in upper and lower respiratory specimens during the acute phase of infection. The lowest concentration of SARS-CoV-2 viral copies this assay can detect is 250 copies / mL. A negative result does not preclude SARS-CoV-2 infection and should not be used as the sole basis for treatment or other patient management decisions.  A negative result may occur with improper specimen collection / handling, submission of specimen other than nasopharyngeal swab, presence of viral mutation(s) within the areas targeted by this assay, and inadequate number of viral copies (<250 copies / mL). A negative result must be combined with clinical observations, patient history, and epidemiological information.  Fact Sheet for Patients:   StrictlyIdeas.no  Fact Sheet for Healthcare Providers: BankingDealers.co.za  This test is not yet approved or  cleared by the [place] FDA and has been authorized for detection and/or diagnosis of SARS-CoV-2 by FDA under an Emergency Use Authorization (EUA).  This EUA will remain in effect (meaning this test can be used) for the duration of the COVID-19 declaration under Section 564(b)(1) of the Act, 21 U.S.C. section 360bbb-3(b)(1), unless the authorization is terminated or revoked sooner.  Performed at [facility], [address] [place], [place] [address]   Blood culture (routine x 2)     Status: Abnormal   Collection Time: 12/28/19  6:24 PM   Specimen: BLOOD  Result Value Ref Range Status   Specimen Description   Final    BLOOD RIGHT ANTECUBITAL Performed at [facility],  [address] [place], [place] [address]    Special Requests   Final    BOTTLES DRAWN AEROBIC AND ANAEROBIC Blood Culture results may not be optimal due to an inadequate volume of blood received in culture bottles Performed at [facility], [address] [place], [place] [address]    Culture  Setup Time   Final    AEROBIC BOTTLE ONLY GRAM POSITIVE COCCI IN CLUSTERS CRITICAL RESULT CALLED TO, READ BACK BY AND VERIFIED WITH: L SEAY PHARMD 12/30/19 0056 JDW    Culture (A)  Final    STAPHYLOCOCCUS EPIDERMIDIS THE SIGNIFICANCE OF ISOLATING THIS ORGANISM FROM A SINGLE SET OF BLOOD CULTURES WHEN MULTIPLE SETS ARE DRAWN IS UNCERTAIN. PLEASE NOTIFY THE MICROBIOLOGY DEPARTMENT WITHIN ONE WEEK IF SPECIATION AND SENSITIVITIES ARE REQUIRED. Performed at [facility], [place] [address] [place], [place] [address]    Report Status 12/31/2019 FINAL  Final  Urine culture     Status: Abnormal   Collection Time: 12/28/19  6:24 PM   Specimen: Urine, Random  Result Value Ref Range Status   Specimen Description   Final    URINE, RANDOM Performed at [facility], [address] [place], [place]  [address]    Special Requests   Final    NONE Performed at [facility], [address] [place], [place] [address]    Culture MULTIPLE SPECIES PRESENT, SUGGEST RECOLLECTION (A)  Final   Report Status 12/30/2019 FINAL  Final  Blood Culture ID Panel (Reflexed)     Status: Abnormal   Collection Time: 12/28/19  6:24 PM  Result Value Ref Range Status   Enterococcus species NOT DETECTED NOT DETECTED Final   Listeria monocytogenes NOT DETECTED NOT DETECTED Final   Staphylococcus species DETECTED (A) NOT DETECTED Final    Comment: Methicillin (oxacillin) susceptible coagulase negative staphylococcus. Possible blood culture contaminant (unless isolated from more than one blood culture draw or clinical case suggests pathogenicity). No antibiotic treatment is indicated for blood   culture contaminants. CRITICAL RESULT CALLED TO, READ BACK BY AND VERIFIED WITH: L SEAY PHARMD 12/30/19 0056 JDW    Staphylococcus aureus (BCID) NOT DETECTED NOT DETECTED Final   Methicillin resistance NOT DETECTED NOT DETECTED Final   Streptococcus species NOT DETECTED NOT DETECTED Final   Streptococcus agalactiae NOT DETECTED NOT DETECTED Final   Streptococcus pneumoniae NOT DETECTED NOT DETECTED Final   Streptococcus pyogenes NOT DETECTED NOT DETECTED Final   Acinetobacter baumannii NOT DETECTED NOT DETECTED Final   Enterobacteriaceae species NOT DETECTED NOT DETECTED Final   Enterobacter cloacae complex NOT DETECTED NOT DETECTED Final   Escherichia coli NOT DETECTED NOT DETECTED Final   Klebsiella oxytoca NOT DETECTED NOT DETECTED Final   Klebsiella pneumoniae NOT DETECTED NOT DETECTED Final   Proteus species NOT DETECTED NOT DETECTED Final   Serratia marcescens NOT DETECTED NOT DETECTED Final   Haemophilus influenzae NOT DETECTED NOT DETECTED Final   Neisseria meningitidis NOT DETECTED NOT DETECTED Final   Pseudomonas aeruginosa NOT DETECTED NOT DETECTED Final   Candida albicans NOT DETECTED NOT DETECTED Final   Candida glabrata NOT DETECTED NOT DETECTED Final   Candida krusei NOT DETECTED NOT DETECTED Final   Candida parapsilosis NOT DETECTED NOT DETECTED Final   Candida tropicalis NOT DETECTED NOT DETECTED Final    Comment: Performed at [facility], [place] [place], [place] [address]  Blood culture (routine x 2)     Status: None (Preliminary result)   Collection Time: 12/28/19  6:58 PM   Specimen: BLOOD  Result Value Ref Range Status   Specimen Description   Final    BLOOD BLOOD RIGHT FOREARM Performed at [facility], [address] [place], [place] [address]    Special Requests   Final    BOTTLES DRAWN AEROBIC AND ANAEROBIC Blood Culture adequate volume Performed at [facility], [address] [place], [place] [address]     Culture  Setup Time   Final    ANAEROBIC BOTTLE ONLY GRAM POSITIVE COCCI IN CLUSTERS CRITICAL VALUE NOTED.  VALUE IS CONSISTENT WITH PREVIOUSLY REPORTED AND CALLED VALUE. Performed at [facility], [place] [address], [place], [place] [address]    Culture GRAM POSITIVE COCCI  Final   Report Status PENDING  Incomplete     Time coordinating discharge:  35 minutes  SIGNED:   Barb Merino, MD  [facility] 12/31/2019, 9:52 AM

## 2019-12-31 NOTE — Plan of Care (Signed)
  Problem: Education: Goal: Knowledge of General Education information will improve Description: Including pain rating scale, medication(s)/side effects and non-pharmacologic comfort measures Outcome: Progressing   Problem: Pain Managment: Goal: General experience of comfort will improve Outcome: Progressing   Problem: Safety: Goal: Ability to remain free from injury will improve Outcome: Progressing   

## 2019-12-31 NOTE — Progress Notes (Signed)
D/C instructions given verbalized understanding. [place] removed. Discharged

## 2020-01-02 LAB — CULTURE, BLOOD (ROUTINE X 2): Special Requests: ADEQUATE

## 2021-06-05 ENCOUNTER — Encounter (HOSPITAL_BASED_OUTPATIENT_CLINIC_OR_DEPARTMENT_OTHER): Payer: Self-pay | Admitting: *Deleted

## 2021-06-05 ENCOUNTER — Other Ambulatory Visit: Payer: Self-pay

## 2021-06-05 ENCOUNTER — Emergency Department (HOSPITAL_BASED_OUTPATIENT_CLINIC_OR_DEPARTMENT_OTHER)
Admission: EM | Admit: 2021-06-05 | Discharge: 2021-06-05 | Disposition: A | Discharge status: Home / Self Care | Payer: BC Managed Care – PPO | Attending: Emergency Medicine | Admitting: Emergency Medicine

## 2021-06-05 DIAGNOSIS — E119 Type 2 diabetes mellitus without complications: Secondary | ICD-10-CM | POA: Insufficient documentation

## 2021-06-05 DIAGNOSIS — I10 Essential (primary) hypertension: Secondary | ICD-10-CM | POA: Insufficient documentation

## 2021-06-05 DIAGNOSIS — L723 Sebaceous cyst: Secondary | ICD-10-CM | POA: Diagnosis not present

## 2021-06-05 DIAGNOSIS — Z7901 Long term (current) use of anticoagulants: Secondary | ICD-10-CM | POA: Diagnosis not present

## 2021-06-05 MED ORDER — CEPHALEXIN 500 MG PO CAPS
500.0000 mg | ORAL_CAPSULE | Freq: Four times a day (QID) | ORAL | 0 refills | Status: AC
Start: 2021-06-05 — End: 2021-06-10

## 2021-06-05 NOTE — ED Provider Notes (Addendum)
[facility] Provider Note   CSN: [id number] Arrival date & time: 06/05/21  2009     History Chief Complaint  Patient presents with   Abscess    Brittany Tran is a 60 y.o. female.  This is a 60 y.o. female with significant medical history as below, including DM, DVT on coumadin who presents to the ED with complaint of possible cyst to abdominal wall  Location: Right lower abdominal wall Duration: 6 years Onset: Gradual Timing: Constant Description: Mild tightness, drainage that appears somewhat purulent, thick; drainage began spontaneously Severity: Mild Exacerbating/Alleviating Factors: Somewhat painful with deep palpation Associated Symptoms: None reported Pertinent Negatives: No fevers, chills, nausea, vomiting, change in bowel or bladder function, rashes.    The history is provided by the patient. No language interpreter was used.  Abscess Associated symptoms: no fever, no headaches and no nausea       Past Medical History:  Diagnosis Date   Cellulitis    Diabetes mellitus without complication (HCC)    Fibromyalgia    History of DVT (deep vein thrombosis)    Hypertension    Rheumatoid arthritis ([place])    Sepsis ([place]) 12/2019    Patient Active Problem List   Diagnosis Date Noted   Morbid obesity with BMI of 50.0-59.9, adult ([place]) 12/29/2019   Sepsis ([place]) 12/28/2019   Chronic venous insufficiency 11/02/2019   Venous stasis ulcer of left calf with fat layer exposed with varicose veins ([place]) 06/22/2018   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[place]) 05/19/2018   Rheumatoid arthritis ([place])    Hypertension    Diabetes mellitus without complication ([place])    Long term (current) use of anticoagulants 05/01/2017   Varicose veins of bilateral lower extremities with other complications 36/46/8032   Cellulitis of left lower extremity     Past Surgical History:  Procedure Laterality Date   HARDWARE REMOVAL Left 09/29/2014    Procedure: REMOVAL OF DEEP IMPLANT OF LEFT FIBULA  AND TIBIA (SEPARATE INCISIONS);  Surgeon: Wylene Simmer, MD;  Location: [facility];  Service: Orthopedics;  Laterality: Left;   ORIF ANKLE FRACTURE  1988   left     OB History   No obstetric history on file.     Family History  Problem Relation Age of Onset   Diabetes Mother     Social History   Tobacco Use   Smoking status: Never   Smokeless tobacco: Never  Vaping Use   Vaping Use: Never used  Substance Use Topics   Alcohol use: No   Drug use: No    Home Medications Prior to Admission medications   Medication Sig Start Date End Date Taking? Authorizing Provider  cephALEXin (KEFLEX) 500 MG capsule Take 1 capsule (500 mg total) by mouth 4 (four) times daily for 5 days. 06/05/21 06/10/21 Yes Jeanell Sparrow, DO  acetaminophen (TYLENOL) 325 MG tablet Take 2 tablets (650 mg total) by mouth every 6 (six) hours as needed for mild pain or fever. 05/21/18   Roxan Hockey, MD  Blood Glucose Monitoring Suppl (TRUE METRIX AIR GLUCOSE METER) DEVI 1 strip by Does not apply route 3 (three) times daily before meals. 06/22/18   Elsie Stain, MD  folic acid (FOLVITE) 1 MG tablet Take 2 mg by mouth daily. 09/27/19   [provider]  gabapentin (NEURONTIN) 100 MG capsule Take 400 mg by mouth at bedtime.  05/05/18   [provider]  glucosamine-chondroitin 500-400 MG tablet Take 1 tablet by  mouth 2 (two) times daily.    [provider]  glucose blood (TRUE METRIX BLOOD GLUCOSE TEST) test strip Use as instructed 06/22/18   Elsie Stain, MD  methotrexate (RHEUMATREX) 2.5 MG tablet Take 25 mg by mouth once a week.  10/28/19   [provider]  oxyCODONE-acetaminophen (PERCOCET/ROXICET) 5-325 MG tablet Take 1-2 tablets by mouth every 6 (six) hours as needed for moderate pain.  03/26/17   [provider]  predniSONE (DELTASONE) 1 MG tablet Take 1 mg by mouth daily with breakfast.      [provider]  triamcinolone (KENALOG) 0.025 % ointment Apply 1 application topically See admin instructions. Apply to periwound with dressing changes. 04/22/18   [provider]  TRUEPLUS LANCETS 28G MISC Use to check blood sugar daily. 06/22/18   Elsie Stain, MD  warfarin (COUMADIN) 5 MG tablet Take 1 tablet (5 mg total) by mouth See admin instructions. Take 2.5 mg by mouth every other day alternating with 5 mg by mouth every other day Patient taking differently: Take 2.5-5 mg by mouth See admin instructions. Take 5mg  tablet by mouth on Tues and Thursdays, then take 2.5mg  rest of days per patient 03/15/17   Velvet Bathe, MD    Allergies    Penicillins, Vancomycin, and Sulfa antibiotics  Review of Systems   Review of Systems  Constitutional:  Negative for activity change and fever.  HENT:  Negative for facial swelling and trouble swallowing.   Eyes:  Negative for discharge and redness.  Respiratory:  Negative for cough and shortness of breath.   Cardiovascular:  Negative for chest pain and palpitations.  Gastrointestinal:  Negative for abdominal pain and nausea.  Genitourinary:  Negative for dysuria and flank pain.  Musculoskeletal:  Negative for back pain and gait problem.  Skin:  Positive for wound. Negative for pallor and rash.  Neurological:  Negative for syncope and headaches.   Physical Exam Updated Vital Signs BP (!) 155/93 (BP Location: Right Arm)    Pulse 94    Temp 98 F (36.7 C)    Resp 18    Ht 5\' 6"  (1.676 m)    Wt 131.5 kg    SpO2 100%    BMI 46.81 kg/m   Physical Exam Vitals and nursing note reviewed.  Constitutional:      General: She is not in acute distress.    Appearance: Normal appearance.  HENT:     Head: Normocephalic and atraumatic.     Right Ear: External ear normal.     Left Ear: External ear normal.     Nose: Nose normal.     Mouth/Throat:     Mouth: Mucous membranes are moist.  Eyes:     General: No scleral icterus.        Right eye: No discharge.        Left eye: No discharge.  Cardiovascular:     Rate and Rhythm: Normal rate and regular rhythm.     Pulses: Normal pulses.     Heart sounds: Normal heart sounds.  Pulmonary:     Effort: Pulmonary effort is normal. No respiratory distress.     Breath sounds: Normal breath sounds.  Abdominal:     General: Abdomen is flat.     Tenderness: There is no abdominal tenderness.  Musculoskeletal:        General: Normal range of motion.     Cervical back: Normal range of motion.     Right lower leg: No  edema.     Left lower leg: No edema.  Skin:    General: Skin is warm and dry.     Capillary Refill: Capillary refill takes less than 2 seconds.          Comments: No streaking, no crepitus  Neurological:     Mental Status: She is alert.  Psychiatric:        Mood and Affect: Mood normal.        Behavior: Behavior normal.          ED Results / Procedures / Treatments   Labs (all labs ordered are listed, but only abnormal results are displayed) Labs Reviewed - No data to display  EKG None  Radiology No results found.  Procedures Debridement  Date/Time: 06/06/2021 12:12 AM Performed by: Jeanell Sparrow, DO Authorized by: Jeanell Sparrow, DO  Consent: Verbal consent obtained. Risks and benefits: risks, benefits and alternatives were discussed Consent given by: patient Patient understanding: patient states understanding of the procedure being performed Required items: required blood products, implants, devices, and special equipment available Patient identity confirmed: verbally with patient and arm band Time out: Immediately prior to procedure a "time out" was called to verify the correct patient, procedure, equipment, support staff and site/side marked as required. Preparation: Patient was prepped and draped in the usual sterile fashion. Local anesthesia used: no  Anesthesia: Local anesthesia used: no  Sedation: Patient sedated:  no  Patient tolerance: patient tolerated the procedure well with no immediate complications     Medications Ordered in ED Medications - No data to display  ED Course  I have reviewed the triage vital signs and the nursing notes.  Pertinent labs & imaging results that were available during my care of the patient were reviewed by me and considered in my medical decision making (see chart for details).    MDM Rules/Calculators/A&P                          CC: cyst  This patient complains of cyst; this involves an extensive number of treatment options and is a complaint that carries with it a high risk of complications and morbidity. Vital signs were reviewed. Serious etiologies considered.  Record review:  Previous records obtained and reviewed   Abdomen is otherwise soft, not peritoneal, vital signs are stable.   Pt with large likely sebaceous cyst to abdominal wall right sided. Draining spontaneously. Further drained at the bedside with copious amounts of sebaceous cyst material. See procedure note. Wound left open to continue drainage. She has some mild erythema surrounding the cyst and will start her on some empiric abx for possible cellulitis; mx allergies so will give keflex. Will have her f/u with gen surg clinic for further eval.   Discussed wound care with patient at bedside and strict return precautions    The patient improved significantly and was discharged in stable condition. Detailed discussions were had with the patient regarding current findings, and need for close f/u with PCP or on call doctor. The patient has been instructed to return immediately if the symptoms worsen in any way for re-evaluation. Patient verbalized understanding and is in agreement with current care plan. All questions answered prior to discharge.      This chart was dictated using voice recognition software.  Despite best efforts to proofread,  errors can occur which can change the  documentation meaning.    Final Clinical Impression(s) / ED Diagnoses Final  diagnoses:  Sebaceous cyst    Rx / DC Orders ED Discharge Orders          Ordered    cephALEXin (KEFLEX) 500 MG capsule  4 times daily        06/05/21 2249             Jeanell Sparrow, DO 06/06/21 0015    Jeanell Sparrow, DO 06/06/21 0015

## 2021-06-05 NOTE — ED Triage Notes (Signed)
C/o abscess x 2 days with drainage

## 2021-10-08 ENCOUNTER — Encounter: Payer: Self-pay | Admitting: Physical Medicine and Rehabilitation

## 2021-12-04 ENCOUNTER — Encounter: Payer: Self-pay | Admitting: Physical Medicine and Rehabilitation

## 2021-12-04 ENCOUNTER — Encounter
Payer: BC Managed Care – PPO | Attending: Physical Medicine and Rehabilitation | Admitting: Physical Medicine and Rehabilitation

## 2021-12-04 VITALS — BP 140/85 | HR 92 | Ht 66.0 in | Wt 289.0 lb

## 2021-12-04 DIAGNOSIS — G4701 Insomnia due to medical condition: Secondary | ICD-10-CM | POA: Insufficient documentation

## 2021-12-04 DIAGNOSIS — M797 Fibromyalgia: Secondary | ICD-10-CM | POA: Diagnosis present

## 2021-12-04 MED ORDER — TOPIRAMATE 25 MG PO TABS: 25.0000 mg | ORAL_TABLET | Freq: Two times a day (BID) | ORAL | 3 refills | Status: DC

## 2021-12-04 MED ORDER — VITAMIN D (ERGOCALCIFEROL) 1.25 MG (50000 UNIT) PO CAPS: 50000.0000 [IU] | ORAL_CAPSULE | ORAL | 0 refills | Status: DC

## 2021-12-04 NOTE — Progress Notes (Signed)
Subjective:    Patient ID: Brittany Tran, female    DOB: 1960-11-27, 61 y.o.   MRN: [id number]  HPI Brittany Tran is a 61 year old woman who presents to establish care for chronic pain.  1) Chronic pain: -she usually takes one Percocet per day for her rheumatoid arthritis pain and fibromyalgia.  -today is a bad day because of the rain.  -she has rheumatoid arthritis and fibromyalgia -her shoulder and knee pain is worst -her rheumatologist does not want to prescribe opioids -has not tried Lyrica, Cymbalta, Elavil, or Savella -scared of water.   2) Restless leg syndrome: -not taking any sleep medicine right now   Pain Inventory Average Pain 7 Pain Right Now 7 My pain is constant and aching  In the last 24 hours, has pain interfered with the following? General activity 6 Relation with others 0 Enjoyment of life 0 What TIME of day is your pain at its worst? morning , daytime, evening, and night Sleep (in general) Poor  Pain is worse with: sitting Pain improves with: medication Relief from Meds: 8  use a cane do you drive?  yes use a wheelchair Do you have any goals in this area?  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  no  Any changes since last visit?  no    Family History  Problem Relation Age of Onset   Diabetes Mother    Social History   Socioeconomic History   Marital status: Single    Spouse name: Not on file   Number of children: Not on file   Years of education: Not on file   Highest education level: Not on file  Occupational History   Occupation: unemployed  Tobacco Use   Smoking status: Never   Smokeless tobacco: Never  Vaping Use   Vaping Use: Never used  Substance and Sexual Activity   Alcohol use: No   Drug use: No   Sexual activity: Not on file  Other Topics Concern   Not on file  Social History Narrative   Not on file   Social Determinants of Health    Financial Resource Strain: Not on file  Food Insecurity: Not on file  Transportation Needs: Not on file  Physical Activity: Not on file  Stress: Not on file  Social Connections: Not on file   Past Surgical History:  Procedure Laterality Date   HARDWARE REMOVAL Left 09/29/2014   Procedure: REMOVAL OF DEEP IMPLANT OF LEFT FIBULA  AND TIBIA (SEPARATE INCISIONS);  Surgeon: Wylene Simmer, MD;  Location: [facility];  Service: Orthopedics;  Laterality: Left;   ORIF ANKLE FRACTURE  1988   left   Past Medical History:  Diagnosis Date   Cellulitis    Diabetes mellitus without complication (HCC)    Fibromyalgia    History of DVT (deep vein thrombosis)    Hypertension    Rheumatoid arthritis ([place])    Sepsis ([place]) 12/2019   Ht '5\' 6"'$  (1.676 m)   Wt 289 lb (131.1 kg)   BMI 46.65 kg/m   Opioid Risk Score:   Fall Risk Score:  `1  Depression screen [facility] 2/9     06/22/2018   11:54 AM  Depression screen PHQ 2/9  Decreased Interest 0  Down, Depressed, Hopeless 0  PHQ - 2 Score 0  Altered sleeping 3  Tired, decreased energy 0  Change in appetite 0  Feeling bad  or failure about yourself  0  Trouble concentrating 0  Moving slowly or fidgety/restless 0  Suicidal thoughts 0  PHQ-9 Score 3    Review of Systems  Musculoskeletal:  Positive for back pain and gait problem.       Pain in both knees both shoulder, upper chest area pian  All other systems reviewed and are negative.      Objective:   Physical Exam Gen: no distress, normal appearing HEENT: oral mucosa pink and moist, NCAT Cardio: Reg rate Chest: normal effort, normal rate of breathing Abd: soft, non-distended Ext: no edema Psych: pleasant, normal affect Skin: intact Neuro/MSK: trigger points palpable in upper extremities    Assessment & Plan:   1) Fibromyalgia is a clinical syndrome characterized by widespread pain and tenderness in addition to a variety of symptoms, including fatigue, anxiety,  depression, and sleep disturbances. Fibromyalgia affects 3-5% of women and up to 25% of women with other rheumatological conditions.   Exercise is a first-line treatment for the disease, and can help with both the physical and emotional symptoms, as well as improving overall health and function.    -Provided with a pain relief journal and discussed that it contains foods and lifestyle tips to naturally help to improve pain. Discussed that these lifestyle strategies are also very good for health unlike some medications which can have negative side effects. Discussed that the act of keeping a journal can be therapeutic and helpful to realize patterns what helps to trigger and alleviate pain.    2) Sciatic nerve pain: -start Topamax '25mg'$  daily -discussed mechanism of action of low dose naltrexone as an opioid receptor antagonist which stimulates your body's production of its own natural endogenous opioids, helping to decrease pain. Discussed that it can also decrease T cell response and thus be helpful in decreasing inflammation, and symptoms of brain fog, fatigue, anxiety, depression, and allergies. Discussed that this medication needs to be compounded at a compounding pharmacy and can more expensive. Discussed that I usually start at '1mg'$  and if this is not providing enough relief then I titrate upward on a monthly basis.

## 2021-12-14 ENCOUNTER — Telehealth: Payer: Self-pay

## 2021-12-14 NOTE — Telephone Encounter (Signed)
Brittany Tran Topiramate 25 MG was helping her pain. But in the PM the pain not controlled. She wanted to know if she could increase the dose in the PM?   Call back phone [phone].

## 2021-12-17 NOTE — Telephone Encounter (Signed)
Message left for call back. Not able to leave a detailed message. No voice ID on recording.

## 2021-12-25 NOTE — Telephone Encounter (Signed)
No response from patient. Call still  goes to non-identified voicemail.

## 2021-12-26 ENCOUNTER — Telehealth: Payer: Self-pay

## 2021-12-26 MED ORDER — VITAMIN D (ERGOCALCIFEROL) 1.25 MG (50000 UNIT) PO CAPS: 50000.0000 [IU] | ORAL_CAPSULE | ORAL | 0 refills | Status: AC

## 2021-12-26 NOTE — Telephone Encounter (Signed)
Patient called stating Topirimate makes her sleepy but don't help with the pain. Please advise. Also states misplace Vit D, need refill called in. I will call refill in.

## 2021-12-27 ENCOUNTER — Encounter
Payer: BC Managed Care – PPO | Attending: Physical Medicine and Rehabilitation | Admitting: Physical Medicine and Rehabilitation

## 2021-12-27 DIAGNOSIS — M797 Fibromyalgia: Secondary | ICD-10-CM | POA: Insufficient documentation

## 2021-12-27 DIAGNOSIS — Z79891 Long term (current) use of opiate analgesic: Secondary | ICD-10-CM | POA: Insufficient documentation

## 2021-12-27 DIAGNOSIS — Z79899 Other long term (current) drug therapy: Secondary | ICD-10-CM | POA: Insufficient documentation

## 2021-12-27 DIAGNOSIS — M069 Rheumatoid arthritis, unspecified: Secondary | ICD-10-CM

## 2021-12-27 DIAGNOSIS — M5416 Radiculopathy, lumbar region: Secondary | ICD-10-CM | POA: Insufficient documentation

## 2021-12-27 DIAGNOSIS — G894 Chronic pain syndrome: Secondary | ICD-10-CM | POA: Insufficient documentation

## 2021-12-28 NOTE — Progress Notes (Signed)
Subjective:    Patient ID: Brittany Tran, female    DOB: [birth year], 61 y.o.   MRN: [id number]  HPI Brittany Tran is a 61 year old woman who presents for follow-up of chronic pain.  1) Chronic pain: -she usually takes one Percocet per day for her rheumatoid arthritis pain and fibromyalgia.  -the rain worsens her pain -last visit we tried her on Topamax and this did not help her pain but it did help her to sleep well at night and to lose weight.  -she would like to restart the Percocet but is unable to com in to the clinic right now due to her work.  -her shoulder and knee pain is worst -her rheumatologist does not want to prescribe opioids -has not tried Lyrica, Cymbalta, Elavil, or Savella -scared of water.   2) Restless leg syndrome: -not taking any sleep medicine right now   Pain Inventory Average Pain 7 Pain Right Now 7 My pain is constant and aching  In the last 24 hours, has pain interfered with the following? General activity 6 Relation with others 0 Enjoyment of life 0 What TIME of day is your pain at its worst? morning , daytime, evening, and night Sleep (in general) Poor  Pain is worse with: sitting Pain improves with: medication Relief from Meds: 8  use a cane do you drive?  yes use a wheelchair Do you have any goals in this area?  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  no  Any changes since last visit?  no    Family History  Problem Relation Age of Onset   Diabetes Mother    Social History   Socioeconomic History   Marital status: Single    Spouse name: Not on file   Number of children: Not on file   Years of education: Not on file   Highest education level: Not on file  Occupational History   Occupation: unemployed  Tobacco Use   Smoking status: Never   Smokeless tobacco: Never  Vaping Use   Vaping Use: Never used  Substance and Sexual Activity   Alcohol use:  No   Drug use: No   Sexual activity: Not on file  Other Topics Concern   Not on file  Social History Narrative   Not on file   Social Determinants of Health   Financial Resource Strain: Not on file  Food Insecurity: Not on file  Transportation Needs: Not on file  Physical Activity: Not on file  Stress: Not on file  Social Connections: Not on file   Past Surgical History:  Procedure Laterality Date   HARDWARE REMOVAL Left 09/29/2014   Procedure: REMOVAL OF DEEP IMPLANT OF LEFT FIBULA  AND TIBIA (SEPARATE INCISIONS);  Surgeon: Wylene Simmer, MD;  Location: [facility];  Service: Orthopedics;  Laterality: Left;   ORIF ANKLE FRACTURE  1988   left   Past Medical History:  Diagnosis Date   Cellulitis    Diabetes mellitus without complication (HCC)    Fibromyalgia    History of DVT (deep vein thrombosis)    Hypertension    Rheumatoid arthritis ([place])    Sepsis ([place]) 12/2019   There were no vitals taken for this visit.  Opioid Risk Score:   Fall Risk Score:  `1  Depression screen [facility] 2/9     12/04/2021    9:45 AM 06/22/2018   11:54 AM  Depression screen PHQ 2/9  Decreased Interest 1 0  Down, Depressed, Hopeless 1 0  PHQ - 2 Score 2 0  Altered sleeping 1 3  Tired, decreased energy 1 0  Change in appetite 1 0  Feeling bad or failure about yourself  1 0  Trouble concentrating 1 0  Moving slowly or fidgety/restless 0 0  Suicidal thoughts 0 0  PHQ-9 Score 7 3    Review of Systems  Musculoskeletal:  Positive for back pain and gait problem.       Pain in both knees both shoulder, upper chest area pian  All other systems reviewed and are negative.      Objective:   Physical Exam Gen: no distress, normal appearing HEENT: oral mucosa pink and moist, NCAT Cardio: Reg rate Chest: normal effort, normal rate of breathing Abd: soft, non-distended Ext: no edema Psych: pleasant, normal affect Skin: intact Neuro/MSK: trigger points palpable in upper  extremities    Assessment & Plan:   1) Fibromyalgia is a clinical syndrome characterized by widespread pain and tenderness in addition to a variety of symptoms, including fatigue, anxiety, depression, and sleep disturbances. Fibromyalgia affects 3-5% of women and up to 25% of women with other rheumatological conditions.   Exercise is a first-line treatment for the disease, and can help with both the physical and emotional symptoms, as well as improving overall health and function.    -Provided with a pain relief journal and discussed that it contains foods and lifestyle tips to naturally help to improve pain. Discussed that these lifestyle strategies are also very good for health unlike some medications which can have negative side effects. Discussed that the act of keeping a journal can be therapeutic and helpful to realize patterns what helps to trigger and alleviate pain.    2) Sciatic nerve pain: -continue Topamax '25mg'$  daily -will send staff message to Judeen Hammans to have her come in for appointment with Zella Ball to sign pain contract and obtain urine sample since the topamax is not helping enough with her pain, she does not want to try additional nerve agents as is afraid of their side effect profile, she would like to restart her Percocet since she tolerated this well in the past and takes no more than two '5mg'$  percocet per day -discussed mechanism of action of low dose naltrexone as an opioid receptor antagonist which stimulates your body's production of its own natural endogenous opioids, helping to decrease pain. Discussed that it can also decrease T cell response and thus be helpful in decreasing inflammation, and symptoms of brain fog, fatigue, anxiety, depression, and allergies. Discussed that this medication needs to be compounded at a compounding pharmacy and can more expensive. Discussed that I usually start at '1mg'$  and if this is not providing enough relief then I titrate upward on a monthly  basis.    5 minutes spent in discussion of her pain, response to topamax, scheduling with Zella Ball for pain contract and urine sample

## 2022-01-01 ENCOUNTER — Encounter: Payer: BC Managed Care – PPO | Admitting: Registered Nurse

## 2022-01-09 ENCOUNTER — Encounter (HOSPITAL_BASED_OUTPATIENT_CLINIC_OR_DEPARTMENT_OTHER): Payer: BC Managed Care – PPO | Admitting: Registered Nurse

## 2022-01-09 VITALS — BP 113/84 | HR 100 | Ht 66.0 in | Wt 272.0 lb

## 2022-01-09 DIAGNOSIS — M069 Rheumatoid arthritis, unspecified: Secondary | ICD-10-CM

## 2022-01-09 DIAGNOSIS — G894 Chronic pain syndrome: Secondary | ICD-10-CM

## 2022-01-09 DIAGNOSIS — Z79899 Other long term (current) drug therapy: Secondary | ICD-10-CM

## 2022-01-09 DIAGNOSIS — M5416 Radiculopathy, lumbar region: Secondary | ICD-10-CM | POA: Diagnosis present

## 2022-01-09 DIAGNOSIS — M797 Fibromyalgia: Secondary | ICD-10-CM | POA: Diagnosis present

## 2022-01-09 DIAGNOSIS — Z79891 Long term (current) use of opiate analgesic: Secondary | ICD-10-CM

## 2022-01-09 NOTE — Progress Notes (Signed)
Subjective:    Patient ID: Brittany Tran, female    DOB: 09/06/1960, 61 y.o.   MRN: [id number]  HPI: Brittany Tran is a 61 y.o. female who returns for follow up appointment for chronic pain and medication refill. She states her pain is located in her lower back radiating into her bilateral lower extremities and bilateral feet with tingling and burning. She also states she has " Fibro Pain". She rates her pain 9. Her current exercise regime is walking with her cane.   [name] Morphine equivalent is 7.5 MME.   UDS ordered today.   Vitals: BP 113/84 P 100 oxygen saturation 95%   Pain Inventory Average Pain 8 Pain Right Now 9 My pain is sharp  In the last 24 hours, has pain interfered with the following? General activity 8 Relation with others 8 Enjoyment of life 8 What TIME of day is your pain at its worst? morning , daytime, evening, and night Sleep (in general) Fair  Pain is worse with: bending, sitting, and standing Pain improves with: medication Relief from Meds: 5  Family History  Problem Relation Age of Onset   Diabetes Mother    Social History   Socioeconomic History   Marital status: Single    Spouse name: Not on file   Number of children: Not on file   Years of education: Not on file   Highest education level: Not on file  Occupational History   Occupation: unemployed  Tobacco Use   Smoking status: Never   Smokeless tobacco: Never  Vaping Use   Vaping Use: Never used  Substance and Sexual Activity   Alcohol use: No   Drug use: No   Sexual activity: Not on file  Other Topics Concern   Not on file  Social History Narrative   Not on file   Social Determinants of Health   Financial Resource Strain: Not on file  Food Insecurity: Not on file  Transportation Needs: Not on file  Physical Activity: Not on file  Stress: Not on file  Social Connections: Not on file   Past Surgical History:  Procedure Laterality Date   HARDWARE REMOVAL Left  09/29/2014   Procedure: REMOVAL OF DEEP IMPLANT OF LEFT FIBULA  AND TIBIA (SEPARATE INCISIONS);  Surgeon: Wylene Simmer, MD;  Location: [facility];  Service: Orthopedics;  Laterality: Left;   ORIF ANKLE FRACTURE  1988   left   Past Surgical History:  Procedure Laterality Date   HARDWARE REMOVAL Left 09/29/2014   Procedure: REMOVAL OF DEEP IMPLANT OF LEFT FIBULA  AND TIBIA (SEPARATE INCISIONS);  Surgeon: Wylene Simmer, MD;  Location: [facility];  Service: Orthopedics;  Laterality: Left;   ORIF ANKLE FRACTURE  1988   left   Past Medical History:  Diagnosis Date   Cellulitis    Diabetes mellitus without complication (HCC)    Fibromyalgia    History of DVT (deep vein thrombosis)    Hypertension    Rheumatoid arthritis ([place])    Sepsis ([place]) 12/2019   BP 113/84   Pulse (!) 113   Ht '5\' 6"'$  (1.676 m)   Wt 272 lb (123.4 kg)   SpO2 96%   BMI 43.90 kg/m   Opioid Risk Score:   Fall Risk Score:  `1  Depression screen [facility] 2/9     01/09/2022    1:41 PM 12/04/2021    9:45 AM 06/22/2018   11:54 AM  Depression screen PHQ 2/9  Decreased Interest  1 1 0  Down, Depressed, Hopeless 1 1 0  PHQ - 2 Score 2 2 0  Altered sleeping  1 3  Tired, decreased energy  1 0  Change in appetite  1 0  Feeling bad or failure about yourself   1 0  Trouble concentrating  1 0  Moving slowly or fidgety/restless  0 0  Suicidal thoughts  0 0  PHQ-9 Score  7 3      Review of Systems  Musculoskeletal:  Positive for neck pain.       Bilateral Arm Pain Bilateral Knee Pain Bilateral foot Pain Bilateral back of lower leg pain  All other systems reviewed and are negative.     Objective:   Physical Exam Vitals and nursing note reviewed.  Constitutional:      Appearance: Normal appearance.  Cardiovascular:     Rate and Rhythm: Normal rate and regular rhythm.     Pulses: Normal pulses.     Heart sounds: Normal heart sounds.  Pulmonary:     Effort: Pulmonary effort is normal.      Breath sounds: Normal breath sounds.  Musculoskeletal:     Cervical back: Normal range of motion and neck supple.     Comments: Normal Muscle Bulk and Muscle Testing Reveals:  Upper Extremities: Decreased ROM 90 Degrees and Muscle Strength 4/5 Lower Extremities : Full ROM and Muscle Strength 5/5 Arises from Table slowly using cane for support Narrow Based Gait     Skin:    General: Skin is warm and dry.  Neurological:     Mental Status: She is alert and oriented to person, place, and time.  Psychiatric:        Mood and Affect: Mood normal.        Behavior: Behavior normal.         Assessment & Plan:  Right Lumbar Radiculitis: Continue HEP as tolerated. Continue Gabapentin. Continue to Monitor.  Fibromyalgia: Continue HEP as tolerated. Continue Gabapentin. Continue to Monitor.  Rheumatoid Arthritis: Rheumatology Following.  Chronic Pain Syndrome: Rx: Oxycodone 5 mg daily as needed for pain. We will continue the opioid monitoring program, this consists of regular clinic visits, examinations, urine drug screen, pill counts as well as use of [place] Controlled Substance Reporting system. A 12 month History has been reviewed on the [place] [facility].    F/U in 1 month

## 2022-01-15 LAB — TOXASSURE SELECT,+ANTIDEPR,UR

## 2022-01-18 ENCOUNTER — Encounter: Payer: Self-pay | Admitting: Registered Nurse

## 2022-01-21 ENCOUNTER — Telehealth: Payer: Self-pay | Admitting: *Deleted

## 2022-01-21 NOTE — Telephone Encounter (Signed)
Urine drug screen for this encounter is appropriately negative for controlled substances.

## 2022-02-01 ENCOUNTER — Emergency Department (HOSPITAL_COMMUNITY): Payer: BC Managed Care – PPO

## 2022-02-01 ENCOUNTER — Inpatient Hospital Stay (HOSPITAL_COMMUNITY)
Admission: EM | Admit: 2022-02-01 | Discharge: 2022-02-13 | DRG: 871 | Disposition: A | Discharge status: Skilled Nursing Facility | Payer: BC Managed Care – PPO | Attending: Internal Medicine | Admitting: Internal Medicine

## 2022-02-01 DIAGNOSIS — A419 Sepsis, unspecified organism: Principal | ICD-10-CM

## 2022-02-01 DIAGNOSIS — N289 Disorder of kidney and ureter, unspecified: Secondary | ICD-10-CM

## 2022-02-01 DIAGNOSIS — Z86718 Personal history of other venous thrombosis and embolism: Secondary | ICD-10-CM

## 2022-02-01 DIAGNOSIS — L0390 Cellulitis, unspecified: Secondary | ICD-10-CM | POA: Diagnosis not present

## 2022-02-01 DIAGNOSIS — N390 Urinary tract infection, site not specified: Secondary | ICD-10-CM | POA: Diagnosis present

## 2022-02-01 DIAGNOSIS — Z888 Allergy status to other drugs, medicaments and biological substances status: Secondary | ICD-10-CM

## 2022-02-01 DIAGNOSIS — M069 Rheumatoid arthritis, unspecified: Secondary | ICD-10-CM | POA: Diagnosis present

## 2022-02-01 DIAGNOSIS — R4182 Altered mental status, unspecified: Secondary | ICD-10-CM

## 2022-02-01 DIAGNOSIS — Z5181 Encounter for therapeutic drug level monitoring: Secondary | ICD-10-CM

## 2022-02-01 DIAGNOSIS — I10 Essential (primary) hypertension: Secondary | ICD-10-CM | POA: Diagnosis present

## 2022-02-01 DIAGNOSIS — Z882 Allergy status to sulfonamides status: Secondary | ICD-10-CM

## 2022-02-01 DIAGNOSIS — G9341 Metabolic encephalopathy: Secondary | ICD-10-CM | POA: Diagnosis present

## 2022-02-01 DIAGNOSIS — Z6841 Body Mass Index (BMI) 40.0 and over, adult: Secondary | ICD-10-CM

## 2022-02-01 DIAGNOSIS — I5032 Chronic diastolic (congestive) heart failure: Secondary | ICD-10-CM | POA: Diagnosis present

## 2022-02-01 DIAGNOSIS — Z7901 Long term (current) use of anticoagulants: Secondary | ICD-10-CM

## 2022-02-01 DIAGNOSIS — C641 Malignant neoplasm of right kidney, except renal pelvis: Secondary | ICD-10-CM | POA: Diagnosis present

## 2022-02-01 DIAGNOSIS — Z833 Family history of diabetes mellitus: Secondary | ICD-10-CM

## 2022-02-01 DIAGNOSIS — I872 Venous insufficiency (chronic) (peripheral): Secondary | ICD-10-CM | POA: Diagnosis present

## 2022-02-01 DIAGNOSIS — E876 Hypokalemia: Secondary | ICD-10-CM | POA: Diagnosis not present

## 2022-02-01 DIAGNOSIS — B372 Candidiasis of skin and nail: Secondary | ICD-10-CM | POA: Diagnosis present

## 2022-02-01 DIAGNOSIS — T380X5A Adverse effect of glucocorticoids and synthetic analogues, initial encounter: Secondary | ICD-10-CM | POA: Diagnosis present

## 2022-02-01 DIAGNOSIS — N179 Acute kidney failure, unspecified: Secondary | ICD-10-CM | POA: Diagnosis present

## 2022-02-01 DIAGNOSIS — E119 Type 2 diabetes mellitus without complications: Secondary | ICD-10-CM

## 2022-02-01 DIAGNOSIS — G894 Chronic pain syndrome: Secondary | ICD-10-CM | POA: Diagnosis present

## 2022-02-01 DIAGNOSIS — Z794 Long term (current) use of insulin: Secondary | ICD-10-CM

## 2022-02-01 DIAGNOSIS — L03116 Cellulitis of left lower limb: Secondary | ICD-10-CM

## 2022-02-01 DIAGNOSIS — I110 Hypertensive heart disease with heart failure: Secondary | ICD-10-CM | POA: Diagnosis present

## 2022-02-01 DIAGNOSIS — E11649 Type 2 diabetes mellitus with hypoglycemia without coma: Secondary | ICD-10-CM | POA: Diagnosis present

## 2022-02-01 DIAGNOSIS — Z79899 Other long term (current) drug therapy: Secondary | ICD-10-CM

## 2022-02-01 DIAGNOSIS — L97929 Non-pressure chronic ulcer of unspecified part of left lower leg with unspecified severity: Secondary | ICD-10-CM | POA: Diagnosis present

## 2022-02-01 DIAGNOSIS — E1165 Type 2 diabetes mellitus with hyperglycemia: Secondary | ICD-10-CM | POA: Diagnosis not present

## 2022-02-01 DIAGNOSIS — Z881 Allergy status to other antibiotic agents status: Secondary | ICD-10-CM

## 2022-02-01 DIAGNOSIS — Z880 Allergy status to penicillin: Secondary | ICD-10-CM

## 2022-02-01 DIAGNOSIS — M797 Fibromyalgia: Secondary | ICD-10-CM | POA: Diagnosis present

## 2022-02-01 DIAGNOSIS — I83009 Varicose veins of unspecified lower extremity with ulcer of unspecified site: Secondary | ICD-10-CM | POA: Diagnosis present

## 2022-02-01 DIAGNOSIS — K5900 Constipation, unspecified: Secondary | ICD-10-CM | POA: Diagnosis present

## 2022-02-01 HISTORY — DX: Unspecified diastolic (congestive) heart failure: I50.30

## 2022-02-01 LAB — URINALYSIS, ROUTINE W REFLEX MICROSCOPIC
Bilirubin Urine: NEGATIVE
Glucose, UA: NEGATIVE mg/dL
Ketones, ur: NEGATIVE mg/dL
Nitrite: NEGATIVE
Protein, ur: 30 mg/dL — AB
Specific Gravity, Urine: >1.046 — ABNORMAL HIGH (ref 1.005–1.030)
WBC, UA: >50 WBC/hpf — ABNORMAL HIGH (ref 0–5)
pH: 7 (ref 5.0–8.0)

## 2022-02-01 LAB — LACTIC ACID, PLASMA
Lactic Acid, Venous: 2.4 mmol/L (ref 0.5–1.9)
Lactic Acid, Venous: 1.8 mmol/L (ref 0.5–1.9)

## 2022-02-01 LAB — RAPID URINE DRUG SCREEN, HOSP PERFORMED
Amphetamines: NOT DETECTED
Barbiturates: NOT DETECTED
Benzodiazepines: NOT DETECTED
Cocaine: NOT DETECTED
Opiates: NOT DETECTED
Tetrahydrocannabinol: NOT DETECTED

## 2022-02-01 LAB — PROTIME-INR
INR: 2.9 — ABNORMAL HIGH (ref 0.8–1.2)
Prothrombin Time: 30.1 seconds — ABNORMAL HIGH (ref 11.4–15.2)

## 2022-02-01 LAB — COMPREHENSIVE METABOLIC PANEL
ALT: 11 U/L (ref 0–44)
AST: 27 U/L (ref 15–41)
Albumin: 2.9 g/dL — ABNORMAL LOW (ref 3.5–5.0)
Alkaline Phosphatase: 104 U/L (ref 38–126)
Anion gap: 7 (ref 5–15)
BUN: 14 mg/dL (ref 8–23)
CO2: 25 mmol/L (ref 22–32)
Calcium: 8.4 mg/dL — ABNORMAL LOW (ref 8.9–10.3)
Chloride: 109 mmol/L (ref 98–111)
Creatinine, Ser: 1.06 mg/dL — ABNORMAL HIGH (ref 0.44–1.00)
GFR, Estimated: 59 mL/min — ABNORMAL LOW (ref >60)
Glucose, Bld: 138 mg/dL — ABNORMAL HIGH (ref 70–99)
Potassium: 3.8 mmol/L (ref 3.5–5.1)
Sodium: 141 mmol/L (ref 135–145)
Total Bilirubin: 1.1 mg/dL (ref 0.3–1.2)
Total Protein: 7.2 g/dL (ref 6.5–8.1)

## 2022-02-01 LAB — CBC
HCT: 39.5 % (ref 36.0–46.0)
Hemoglobin: 13.1 g/dL (ref 12.0–15.0)
MCH: 33.1 pg (ref 26.0–34.0)
MCHC: 33.2 g/dL (ref 30.0–36.0)
MCV: 99.7 fL (ref 80.0–100.0)
Platelets: 196 10*3/uL (ref 150–400)
RBC: 3.96 MIL/uL (ref 3.87–5.11)
RDW: 15.8 % — ABNORMAL HIGH (ref 11.5–15.5)
WBC: 29.5 10*3/uL — ABNORMAL HIGH (ref 4.0–10.5)
nRBC: 0 % (ref 0.0–0.2)

## 2022-02-01 LAB — ETHANOL: Alcohol, Ethyl (B): <10 mg/dL (ref <10)

## 2022-02-01 LAB — TROPONIN I (HIGH SENSITIVITY): Troponin I (High Sensitivity): 30 ng/L — ABNORMAL HIGH (ref <18)

## 2022-02-01 MED ORDER — ACETAMINOPHEN 500 MG PO TABS
1000.0000 mg | ORAL_TABLET | Freq: Once | ORAL | Status: AC
  Administered 2022-02-01: 1000 mg via ORAL
  Filled 2022-02-01: qty 2

## 2022-02-01 MED ORDER — LACTATED RINGERS IV BOLUS: 1000.0000 mL | Freq: Once | INTRAVENOUS | Status: DC

## 2022-02-01 MED ORDER — LACTATED RINGERS IV SOLN: INTRAVENOUS | Status: DC

## 2022-02-01 MED ORDER — LACTATED RINGERS IV BOLUS (SEPSIS): 800.0000 mL | Freq: Once | INTRAVENOUS | Status: DC

## 2022-02-01 MED ORDER — LACTATED RINGERS IV BOLUS (SEPSIS)
1000.0000 mL | Freq: Once | INTRAVENOUS | Status: AC
  Administered 2022-02-01: 1000 mL via INTRAVENOUS

## 2022-02-01 MED ORDER — VANCOMYCIN HCL 750 MG/150ML IV SOLN
750.0000 mg | Freq: Two times a day (BID) | INTRAVENOUS | Status: DC
  Administered 2022-02-02 – 2022-02-06 (×9): 750 mg via INTRAVENOUS
  Filled 2022-02-01 (×10): qty 150

## 2022-02-01 MED ORDER — SODIUM CHLORIDE 0.9 % IV SOLN
2.0000 g | Freq: Once | INTRAVENOUS | Status: AC
  Administered 2022-02-01: 2 g via INTRAVENOUS
  Filled 2022-02-01: qty 20

## 2022-02-01 MED ORDER — VANCOMYCIN HCL 1750 MG/350ML IV SOLN
1750.0000 mg | Freq: Once | INTRAVENOUS | Status: AC
  Administered 2022-02-01: 1750 mg via INTRAVENOUS
  Filled 2022-02-01: qty 350

## 2022-02-01 MED ORDER — DIPHENHYDRAMINE HCL 50 MG/ML IJ SOLN
25.0000 mg | Freq: Once | INTRAMUSCULAR | Status: AC
  Administered 2022-02-01: 25 mg via INTRAVENOUS
  Filled 2022-02-01: qty 1

## 2022-02-01 MED ORDER — IOHEXOL 350 MG/ML SOLN
100.0000 mL | Freq: Once | INTRAVENOUS | Status: AC | PRN
  Administered 2022-02-01: 100 mL via INTRAVENOUS

## 2022-02-01 NOTE — Progress Notes (Signed)
Pharmacy Antibiotic Note  Brittany Tran is a 61 y.o. female admitted on 02/01/2022 presenting with generalized weakness, cellulitis of the leg.  Pharmacy has been consulted for vancomycin dosing.  Hx of redman like rx, will pre-medicate and extend infusion  Plan: Vancomycin 1750 mg IV x 1, then 750 mg (over 145mn) IV q 12h (eAUC 515, SCr 1.06) Monitor renal function, tolerance, clinical progression to narrow Vancomycin levels as indicated  Height: '5\' 6"'$  (167.6 cm) Weight: 123.4 kg (272 lb 0.8 oz) IBW/kg (Calculated) : 59.3  Temp (24hrs), Avg:103.3 F (39.6 C), Min:103.3 F (39.6 C), Max:103.3 F (39.6 C)  Recent Labs  Lab 02/01/22 1612 02/01/22 1831  WBC 29.5*  --   CREATININE 1.06*  --   LATICACIDVEN 2.4* 1.8    Estimated Creatinine Clearance: 73.8 mL/min (A) (by C-G formula based on SCr of 1.06 mg/dL (H)).    Allergies  Allergen Reactions   Penicillins Hives    Has patient had a PCN reaction causing immediate rash, facial/tongue/throat swelling, SOB or lightheadedness with hypotension: Yes Has patient had a PCN reaction causing severe rash involving mucus membranes or skin necrosis: No Has patient had a PCN reaction that required hospitalization: No Has patient had a PCN reaction occurring within the last 10 years: No If all of the above answers are "NO", then may proceed with Cephalosporin use.    Vancomycin Rash    Developed a red rash bilaterally on her arms, legs, neck, chest and abdomen   Penicillin G Benzathine     Other reaction(s): rash   Sulfa Antibiotics Hives and Rash    Other reaction(s): Other (See Comments) Other reaction(s): Unknown    JBertis Ruddy PharmD Clinical Pharmacist ED Pharmacist Phone # [phone]/18/2023 10:27 PM

## 2022-02-01 NOTE — ED Notes (Signed)
Patient transported to CT 

## 2022-02-01 NOTE — ED Triage Notes (Addendum)
Pt BIB EMS for generalized weakness. Friends found her halfway out of her recliner and called EMS. They were worried when they had not heard from her in the past 12 hours, as she is a diabetic. EMS thinks she possibly may have a UTI, pt also was not able to tolerate ambulating and was confused axox3.   Pt slightly tachy with HR in the low 100s. Other VSS.

## 2022-02-01 NOTE — ED Provider Notes (Signed)
[facility] EMERGENCY DEPARTMENT Provider Note   CSN: [id number] Arrival date & time: 02/01/22  1547     History  Chief Complaint  Patient presents with   Weakness    Brittany Tran is a 61 y.o. female who presents the emergency department for generalized weakness.  Friends found patient hanging out of her chair at home and called EMS.  They were worried because they had not heard from her in the past 12 hours.  She is a known diabetic.  EMS thought she could have had a UTI.  Patient was not tolerating ambulating and was confused on scene.  They also noted her to be tachycardic with heart rates in the low 100s.  On my exam patient is alert to person and place, time of the year but believes the month is February.  She reports that yesterday she got caught in the rain while coming home from work and got "very wet".  She is unable to give me any further information about what brings her here specifically.  Upon further questioning it patient's reports compliance with her anticoagulation regimen, with history of recurrent DVT.  Wounds noted on her left leg and patient states she bandaged it herself 2 days ago, and is to follow-up with the wound doctor.  Level 5 caveat due to mental status change   Weakness Associated symptoms: dysuria and myalgias        Home Medications Prior to Admission medications   Medication Sig Start Date End Date Taking? Authorizing Provider  acetaminophen (TYLENOL) 325 MG tablet Take 2 tablets (650 mg total) by mouth every 6 (six) hours as needed for mild pain or fever. 05/21/18   Roxan Hockey, MD  Blood Glucose Monitoring Suppl (TRUE METRIX AIR GLUCOSE METER) DEVI 1 strip by Does not apply route 3 (three) times daily before meals. 06/22/18   Elsie Stain, MD  Calcium Carb-Cholecalciferol (OYSTER SHELL CALCIUM/VITAMIN D) 500-5 MG-MCG PACK 1 tablet with a meal    [provider]  Calcium-Magnesium-Vitamin D 500-250-200  MG-MG-UNIT TABS 1 tablet with a meal    [provider]  folic acid (FOLVITE) 1 MG tablet Take 2 mg by mouth daily. 09/27/19   [provider]  gabapentin (NEURONTIN) 100 MG capsule Take 400 mg by mouth at bedtime.  05/05/18   [provider]  gabapentin (NEURONTIN) 300 MG capsule Take 1 capsule by mouth 3 (three) times daily.    [provider]  glucosamine-chondroitin 500-400 MG tablet Take 1 tablet by mouth 2 (two) times daily.    [provider]  glucose blood (TRUE METRIX BLOOD GLUCOSE TEST) test strip Use as instructed 06/22/18   Elsie Stain, MD  LORazepam (ATIVAN) 0.5 MG tablet 1 tablet at bedtime as needed 11/30/20   [provider]  methotrexate (RHEUMATREX) 2.5 MG tablet Take 25 mg by mouth once a week.  10/28/19   [provider]  Multiple Vitamin (MULTIVITAMIN ADULT PO)     [provider]  oxyCODONE-acetaminophen (PERCOCET/ROXICET) 5-325 MG tablet Take 1-2 tablets by mouth every 6 (six) hours as needed for moderate pain.  03/26/17   [provider]  predniSONE (DELTASONE) 1 MG tablet Take 1 mg by mouth daily with breakfast.     [provider]  topiramate (TOPAMAX) 25 MG tablet Take 1 tablet (25 mg total) by mouth 2 (two) times daily. 12/04/21   Raulkar, Clide Deutscher, MD  TRUEPLUS LANCETS 28G MISC Use to check blood sugar  daily. 06/22/18   Elsie Stain, MD  Vitamin D, Ergocalciferol, (DRISDOL) 1.25 MG (50000 UNIT) CAPS capsule Take 1 capsule (50,000 Units total) by mouth every 7 (seven) days. 12/26/21   Izora Ribas, MD  warfarin (COUMADIN) 5 MG tablet 1 tablet 09/19/21   [provider]      Allergies    Penicillins, Vancomycin, Penicillin g benzathine, and Sulfa antibiotics    Review of Systems   Review of Systems  Unable to perform ROS: Mental status change  Genitourinary:  Positive for dysuria.  Musculoskeletal:  Positive for myalgias.  Skin:  Positive for wound.   Neurological:  Positive for weakness.    Physical Exam Updated Vital Signs BP (!) 146/84   Pulse 100   Temp (!) 103.3 F (39.6 C) (Oral)   Resp (!) 30   Ht '5\' 6"'$  (1.676 m)   Wt 123.4 kg   SpO2 95%   BMI 43.91 kg/m  Physical Exam Vitals and nursing note reviewed.  Constitutional:      Appearance: She is ill-appearing.  HENT:     Head: Normocephalic and atraumatic.  Eyes:     Conjunctiva/sclera: Conjunctivae normal.  Cardiovascular:     Rate and Rhythm: Regular rhythm. Tachycardia present.     Pulses:          Dorsalis pedis pulses are detected w/ Doppler on the right side and detected w/ Doppler on the left side.  Pulmonary:     Effort: Pulmonary effort is normal. Tachypnea present. No respiratory distress.     Breath sounds: Normal breath sounds.  Abdominal:     General: There is no distension.     Palpations: Abdomen is soft.     Tenderness: There is no abdominal tenderness.  Skin:    General: Skin is warm and dry.     Comments: Erythematous and edematous left leg as imaged below with small ulcer on the anterior ankle  Neurological:     General: No focal deficit present.     Mental Status: She is lethargic.       ED Results / Procedures / Treatments   Labs (all labs ordered are listed, but only abnormal results are displayed) Labs Reviewed  COMPREHENSIVE METABOLIC PANEL - Abnormal; Notable for the following components:      Result Value   Glucose, Bld 138 (*)    Creatinine, Ser 1.06 (*)    Calcium 8.4 (*)    Albumin 2.9 (*)    GFR, Estimated 59 (*)    All other components within normal limits  CBC - Abnormal; Notable for the following components:   WBC 29.5 (*)    RDW 15.8 (*)    All other components within normal limits  LACTIC ACID, PLASMA - Abnormal; Notable for the following components:   Lactic Acid, Venous 2.4 (*)    All other components within normal limits  PROTIME-INR - Abnormal; Notable for the following components:   Prothrombin Time 30.1  (*)    INR 2.9 (*)    All other components within normal limits  CULTURE, BLOOD (ROUTINE X 2)  CULTURE, BLOOD (ROUTINE X 2)  URINE CULTURE  ETHANOL  URINALYSIS, ROUTINE W REFLEX MICROSCOPIC  RAPID URINE DRUG SCREEN, HOSP PERFORMED  LACTIC ACID, PLASMA  APTT  TROPONIN I (HIGH SENSITIVITY)    EKG EKG Interpretation  Date/Time:  Friday February 01 2022 17:13:08 EDT Ventricular Rate:  119 PR Interval:  174 QRS Duration: 90 QT Interval:  329 QTC Calculation: 463  R Axis:   -3 Text Interpretation: Sinus tachycardia Nonspecific T abnormalities, anterior leads Confirmed by Kommor, [place] [phone]) on 02/01/2022 6:47:14 PM  Radiology DG Chest Port 1 View  Result Date: 02/01/2022 CLINICAL DATA:  Possible sepsis. EXAM: PORTABLE CHEST 1 VIEW COMPARISON:  Radiograph 12/28/2019 FINDINGS: Low lung volumes with moderate patient rotation. Suspected cardiomegaly. Patchy left lung base opacity. No pulmonary edema. No large pleural effusion or visualized pneumothorax. On limited assessment, no acute osseous findings. IMPRESSION: Low lung volumes with moderate patient rotation. Patchy left lung base opacity may be atelectasis or pneumonia. Electronically Signed   By: Keith Rake M.D.   On: 02/01/2022 17:19   CT Head Wo Contrast  Result Date: 02/01/2022 CLINICAL DATA:  Altered mental status EXAM: CT HEAD WITHOUT CONTRAST TECHNIQUE: Contiguous axial images were obtained from the base of the skull through the vertex without intravenous contrast. RADIATION DOSE REDUCTION: This exam was performed according to the departmental dose-optimization program which includes automated exposure control, adjustment of the mA and/or kV according to patient size and/or use of iterative reconstruction technique. COMPARISON:  None Available. FINDINGS: Brain: No acute intracranial findings are seen in noncontrast CT brain. There are no signs of bleeding within the cranium. Ventricles are nondilated. Cortical sulci are  prominent. Vascular: Scattered arterial calcifications are seen. Skull: Unremarkable. Sinuses/Orbits: Unremarkable. Other: None. IMPRESSION: No acute intracranial findings are seen in noncontrast CT brain. Atrophy. Electronically Signed   By: Elmer Picker M.D.   On: 02/01/2022 16:58    Procedures .Critical Care  Performed by: Kateri Plummer, PA-C Authorized by: Kateri Plummer, PA-C   Critical care provider statement:    Critical care time (minutes):  30   Critical care time was exclusive of:  Separately billable procedures and treating other patients   Critical care was necessary to treat or prevent imminent or life-threatening deterioration of the following conditions:  Sepsis   Critical care was time spent personally by me on the following activities:  Development of treatment plan with patient or surrogate, discussions with consultants, evaluation of patient's response to treatment, examination of patient, ordering and review of laboratory studies, ordering and review of radiographic studies, ordering and performing treatments and interventions, pulse oximetry, re-evaluation of patient's condition, review of old charts and obtaining history from patient or surrogate   Care discussed with: admitting provider       Medications Ordered in ED Medications  lactated ringers infusion (has no administration in time range)  lactated ringers bolus 1,000 mL (has no administration in time range)  acetaminophen (TYLENOL) tablet 1,000 mg (has no administration in time range)  lactated ringers bolus 1,000 mL (0 mLs Intravenous Stopped 02/01/22 1842)  cefTRIAXone (ROCEPHIN) 2 g in sodium chloride 0.9 % 100 mL IVPB (0 g Intravenous Stopped 02/01/22 1843)    ED Course/ Medical Decision Making/ A&P                           Medical Decision Making Amount and/or Complexity of Data Reviewed Labs: ordered. Radiology: ordered. ECG/medicine tests: ordered.  Risk Prescription drug  management.  This patient is a 61 y.o. female who presents to the ED for concern of weakness, this involves an extensive number of treatment options, and is a complaint that carries with it a high risk of complications and morbidity. The emergent differential diagnosis prior to evaluation includes, but is not limited to,  Neurologic causes (GBS, CVA, MS, ALS, spinal cord injury); ACS,  Arrhythmia, syncope, orthostatic hypotension, sepsis, hypoglycemia, electrolyte disturbance, hypothyroidism, respiratory failure, anemia, dehydration, heat injury, polypharmacy, malignancy. This is not an exhaustive differential.   Past Medical History / Co-morbidities / Social History: Hypertension, diabetes, recurrent DVT on Coumadin, rheumatoid arthritis, fibromyalgia  Additional history: Chart reviewed.   Physical Exam: Physical exam performed. The pertinent findings include: Febrile to 103.76F, tachycardic to 125, tachypneic to 28.  Normal oxygen saturation on room air, no increased respiratory effort.  Is ill-appearing and lethargic.  Wound noted to left anterior ankle, erythematous and edematous left calf compared to the right.  Pulses dopplerable bilaterally.  Abdomen soft, nontender.  Lab Tests: I ordered, and personally interpreted labs.  The pertinent results include: Leukocytosis of 29.5.  Creatinine 1.06, GFR 59, otherwise CMP unremarkable.  Ethanol negative.  Lactic acid 2.4.   Imaging Studies: I ordered imaging studies including x-ray, CT head, CT PE study, CT abdomen pelvis. I independently visualized and interpreted imaging which showed CT head and chest x-ray without acute findings. I agree with the radiologist interpretation.  CT PE study and CT abdomen pelvis pending.   Cardiac Monitoring:  The patient was maintained on a cardiac monitor.  My attending physician interpreted the cardiac monitored which showed an underlying rhythm of: Sinus tachycardia   Medications: I ordered medication  including IV fluids and antibiotics per sepsis protocol. I have reviewed the patients home medicines and have made adjustments as needed.  Disposition: After consideration of the diagnostic results and the patients response to treatment, I feel that patient is requiring admission.  I discussed this case with my attending physician Dr. Matilde Sprang who cosigned this note including patient's presenting symptoms, physical exam, and planned diagnostics and interventions. Attending physician stated agreement with plan or made changes to plan which were implemented.   He will consult hospitalist for admission after results of scans determine further antibiotic regimen.   Final Clinical Impression(s) / ED Diagnoses Final diagnoses:  Sepsis without acute organ dysfunction, due to unspecified organism ([place])  Altered mental status, unspecified altered mental status type  Cellulitis of left lower extremity    Rx / DC Orders ED Discharge Orders     None      Portions of this report may have been transcribed using voice recognition software. Every effort was made to ensure accuracy; however, inadvertent computerized transcription errors may be present.    Estill Cotta 02/01/22 1848    Teressa Lower, MD 02/02/22 1944

## 2022-02-02 ENCOUNTER — Other Ambulatory Visit: Payer: Self-pay

## 2022-02-02 ENCOUNTER — Inpatient Hospital Stay (HOSPITAL_COMMUNITY): Payer: BC Managed Care – PPO

## 2022-02-02 ENCOUNTER — Encounter (HOSPITAL_COMMUNITY): Payer: Self-pay | Admitting: Internal Medicine

## 2022-02-02 DIAGNOSIS — M069 Rheumatoid arthritis, unspecified: Secondary | ICD-10-CM | POA: Diagnosis present

## 2022-02-02 DIAGNOSIS — G9341 Metabolic encephalopathy: Secondary | ICD-10-CM | POA: Diagnosis present

## 2022-02-02 DIAGNOSIS — E119 Type 2 diabetes mellitus without complications: Secondary | ICD-10-CM

## 2022-02-02 DIAGNOSIS — Z881 Allergy status to other antibiotic agents status: Secondary | ICD-10-CM | POA: Diagnosis not present

## 2022-02-02 DIAGNOSIS — I5031 Acute diastolic (congestive) heart failure: Secondary | ICD-10-CM | POA: Diagnosis not present

## 2022-02-02 DIAGNOSIS — I83009 Varicose veins of unspecified lower extremity with ulcer of unspecified site: Secondary | ICD-10-CM | POA: Diagnosis present

## 2022-02-02 DIAGNOSIS — N289 Disorder of kidney and ureter, unspecified: Secondary | ICD-10-CM

## 2022-02-02 DIAGNOSIS — B372 Candidiasis of skin and nail: Secondary | ICD-10-CM | POA: Diagnosis present

## 2022-02-02 DIAGNOSIS — Z7901 Long term (current) use of anticoagulants: Secondary | ICD-10-CM

## 2022-02-02 DIAGNOSIS — Z880 Allergy status to penicillin: Secondary | ICD-10-CM | POA: Diagnosis not present

## 2022-02-02 DIAGNOSIS — E876 Hypokalemia: Secondary | ICD-10-CM | POA: Diagnosis not present

## 2022-02-02 DIAGNOSIS — L0390 Cellulitis, unspecified: Secondary | ICD-10-CM

## 2022-02-02 DIAGNOSIS — N179 Acute kidney failure, unspecified: Secondary | ICD-10-CM | POA: Diagnosis present

## 2022-02-02 DIAGNOSIS — L97929 Non-pressure chronic ulcer of unspecified part of left lower leg with unspecified severity: Secondary | ICD-10-CM | POA: Diagnosis present

## 2022-02-02 DIAGNOSIS — L03116 Cellulitis of left lower limb: Secondary | ICD-10-CM | POA: Diagnosis present

## 2022-02-02 DIAGNOSIS — A419 Sepsis, unspecified organism: Secondary | ICD-10-CM | POA: Diagnosis present

## 2022-02-02 DIAGNOSIS — E11649 Type 2 diabetes mellitus with hypoglycemia without coma: Secondary | ICD-10-CM | POA: Diagnosis present

## 2022-02-02 DIAGNOSIS — I110 Hypertensive heart disease with heart failure: Secondary | ICD-10-CM | POA: Diagnosis present

## 2022-02-02 DIAGNOSIS — Z6841 Body Mass Index (BMI) 40.0 and over, adult: Secondary | ICD-10-CM | POA: Diagnosis not present

## 2022-02-02 DIAGNOSIS — K5900 Constipation, unspecified: Secondary | ICD-10-CM | POA: Diagnosis present

## 2022-02-02 DIAGNOSIS — N390 Urinary tract infection, site not specified: Secondary | ICD-10-CM | POA: Diagnosis present

## 2022-02-02 DIAGNOSIS — Z5181 Encounter for therapeutic drug level monitoring: Secondary | ICD-10-CM | POA: Diagnosis not present

## 2022-02-02 DIAGNOSIS — M797 Fibromyalgia: Secondary | ICD-10-CM | POA: Diagnosis present

## 2022-02-02 DIAGNOSIS — E1165 Type 2 diabetes mellitus with hyperglycemia: Secondary | ICD-10-CM | POA: Diagnosis not present

## 2022-02-02 DIAGNOSIS — I5032 Chronic diastolic (congestive) heart failure: Secondary | ICD-10-CM | POA: Diagnosis present

## 2022-02-02 DIAGNOSIS — Z794 Long term (current) use of insulin: Secondary | ICD-10-CM | POA: Diagnosis not present

## 2022-02-02 DIAGNOSIS — Z86718 Personal history of other venous thrombosis and embolism: Secondary | ICD-10-CM | POA: Diagnosis not present

## 2022-02-02 DIAGNOSIS — C641 Malignant neoplasm of right kidney, except renal pelvis: Secondary | ICD-10-CM | POA: Diagnosis present

## 2022-02-02 LAB — ECHOCARDIOGRAM COMPLETE
AR max vel: 1.84 cm2
AV Area VTI: 1.93 cm2
AV Area mean vel: 2.36 cm2
AV Mean grad: 6.9 mmHg
AV Peak grad: 16.8 mmHg
Ao pk vel: 2.05 m/s
Area-P 1/2: 2.69 cm2
Calc EF: 61.9 %
Height: 66 in
S' Lateral: 3.3 cm
Single Plane A2C EF: 53.6 %
Single Plane A4C EF: 66.4 %
Weight: 4352.76 oz

## 2022-02-02 LAB — CBC
HCT: 34.2 % — ABNORMAL LOW (ref 36.0–46.0)
Hemoglobin: 11.2 g/dL — ABNORMAL LOW (ref 12.0–15.0)
MCH: 33.6 pg (ref 26.0–34.0)
MCHC: 32.7 g/dL (ref 30.0–36.0)
MCV: 102.7 fL — ABNORMAL HIGH (ref 80.0–100.0)
Platelets: 135 10*3/uL — ABNORMAL LOW (ref 150–400)
RBC: 3.33 MIL/uL — ABNORMAL LOW (ref 3.87–5.11)
RDW: 15.9 % — ABNORMAL HIGH (ref 11.5–15.5)
WBC: 18.2 10*3/uL — ABNORMAL HIGH (ref 4.0–10.5)
nRBC: 0.1 % (ref 0.0–0.2)

## 2022-02-02 LAB — BASIC METABOLIC PANEL
Anion gap: 6 (ref 5–15)
BUN: 12 mg/dL (ref 8–23)
CO2: 23 mmol/L (ref 22–32)
Calcium: 7.8 mg/dL — ABNORMAL LOW (ref 8.9–10.3)
Chloride: 110 mmol/L (ref 98–111)
Creatinine, Ser: 0.83 mg/dL (ref 0.44–1.00)
GFR, Estimated: >60 mL/min (ref >60)
Glucose, Bld: 96 mg/dL (ref 70–99)
Potassium: 3.5 mmol/L (ref 3.5–5.1)
Sodium: 139 mmol/L (ref 135–145)

## 2022-02-02 LAB — HEMOGLOBIN A1C
Hgb A1c MFr Bld: 6.4 % — ABNORMAL HIGH (ref 4.8–5.6)
Mean Plasma Glucose: 136.98 mg/dL

## 2022-02-02 LAB — CBG MONITORING, ED
Glucose-Capillary: 97 mg/dL (ref 70–99)
Glucose-Capillary: 79 mg/dL (ref 70–99)
Glucose-Capillary: 87 mg/dL (ref 70–99)

## 2022-02-02 LAB — TROPONIN I (HIGH SENSITIVITY): Troponin I (High Sensitivity): 29 ng/L — ABNORMAL HIGH (ref <18)

## 2022-02-02 LAB — PROTIME-INR
INR: 2.9 — ABNORMAL HIGH (ref 0.8–1.2)
Prothrombin Time: 30.1 seconds — ABNORMAL HIGH (ref 11.4–15.2)

## 2022-02-02 LAB — GLUCOSE, CAPILLARY
Glucose-Capillary: 100 mg/dL — ABNORMAL HIGH (ref 70–99)
Glucose-Capillary: 95 mg/dL (ref 70–99)

## 2022-02-02 LAB — BRAIN NATRIURETIC PEPTIDE: B Natriuretic Peptide: 424.5 pg/mL — ABNORMAL HIGH (ref 0.0–100.0)

## 2022-02-02 LAB — HIV ANTIBODY (ROUTINE TESTING W REFLEX): HIV Screen 4th Generation wRfx: NONREACTIVE

## 2022-02-02 MED ORDER — GABAPENTIN 400 MG PO CAPS
400.0000 mg | ORAL_CAPSULE | Freq: Every day | ORAL | Status: DC
  Administered 2022-02-02 – 2022-02-12 (×12): 400 mg via ORAL
  Filled 2022-02-02 (×12): qty 1

## 2022-02-02 MED ORDER — SODIUM CHLORIDE 0.9 % IV SOLN
1.0000 g | INTRAVENOUS | Status: DC
  Administered 2022-02-02: 1 g via INTRAVENOUS

## 2022-02-02 MED ORDER — PREDNISONE 1 MG PO TABS
1.0000 mg | ORAL_TABLET | Freq: Every day | ORAL | Status: DC
Start: 2022-02-02 — End: 2022-02-02
  Filled 2022-02-02: qty 1

## 2022-02-02 MED ORDER — DIPHENHYDRAMINE HCL 50 MG/ML IJ SOLN
12.5000 mg | Freq: Three times a day (TID) | INTRAMUSCULAR | Status: DC | PRN
  Administered 2022-02-02 – 2022-02-06 (×9): 12.5 mg via INTRAVENOUS
  Filled 2022-02-02 (×9): qty 1

## 2022-02-02 MED ORDER — FOLIC ACID 1 MG PO TABS
2.0000 mg | ORAL_TABLET | Freq: Every day | ORAL | Status: DC
  Administered 2022-02-02 – 2022-02-13 (×12): 2 mg via ORAL
  Filled 2022-02-02 (×12): qty 2

## 2022-02-02 MED ORDER — INSULIN ASPART 100 UNIT/ML IJ SOLN
0.0000 [IU] | Freq: Three times a day (TID) | INTRAMUSCULAR | Status: DC
  Administered 2022-02-06: 3 [IU] via SUBCUTANEOUS

## 2022-02-02 MED ORDER — FUROSEMIDE 10 MG/ML IJ SOLN
20.0000 mg | Freq: Two times a day (BID) | INTRAMUSCULAR | Status: DC
  Administered 2022-02-02: 20 mg via INTRAVENOUS
  Filled 2022-02-02 (×2): qty 2

## 2022-02-02 MED ORDER — WARFARIN SODIUM 5 MG PO TABS
5.0000 mg | ORAL_TABLET | ORAL | Status: DC
Start: 2022-02-02 — End: 2022-02-06
  Administered 2022-02-02 – 2022-02-04 (×2): 5 mg via ORAL
  Filled 2022-02-02 (×3): qty 1

## 2022-02-02 MED ORDER — GABAPENTIN 300 MG PO CAPS
300.0000 mg | ORAL_CAPSULE | Freq: Three times a day (TID) | ORAL | Status: DC
  Administered 2022-02-02 – 2022-02-13 (×34): 300 mg via ORAL
  Filled 2022-02-02 (×34): qty 1

## 2022-02-02 MED ORDER — PERFLUTREN LIPID MICROSPHERE
1.0000 mL | INTRAVENOUS | Status: AC | PRN
  Administered 2022-02-02: 2 mL via INTRAVENOUS

## 2022-02-02 MED ORDER — LORAZEPAM 0.5 MG PO TABS
0.5000 mg | ORAL_TABLET | Freq: Every evening | ORAL | Status: DC | PRN
Start: 2022-02-02 — End: 2022-02-13
  Administered 2022-02-04 – 2022-02-10 (×3): 0.5 mg via ORAL
  Filled 2022-02-02 (×3): qty 1

## 2022-02-02 MED ORDER — TOPIRAMATE 25 MG PO TABS
25.0000 mg | ORAL_TABLET | Freq: Two times a day (BID) | ORAL | Status: DC
  Administered 2022-02-02 – 2022-02-13 (×24): 25 mg via ORAL
  Filled 2022-02-02 (×28): qty 1

## 2022-02-02 MED ORDER — WARFARIN SODIUM 5 MG PO TABS: 5.0000 mg | ORAL_TABLET | Freq: Every day | ORAL | Status: DC

## 2022-02-02 MED ORDER — LACTATED RINGERS IV SOLN: INTRAVENOUS | Status: DC

## 2022-02-02 MED ORDER — PREDNISONE 5 MG PO TABS
5.0000 mg | ORAL_TABLET | Freq: Every day | ORAL | Status: DC
  Administered 2022-02-02 – 2022-02-09 (×8): 5 mg via ORAL
  Filled 2022-02-02 (×8): qty 1

## 2022-02-02 MED ORDER — NYSTATIN 100000 UNIT/GM EX POWD
Freq: Two times a day (BID) | CUTANEOUS | Status: DC
  Filled 2022-02-02 (×3): qty 15

## 2022-02-02 MED ORDER — INSULIN GLARGINE-YFGN 100 UNIT/ML ~~LOC~~ SOLN
10.0000 [IU] | Freq: Every day | SUBCUTANEOUS | Status: DC
  Administered 2022-02-02 – 2022-02-06 (×6): 10 [IU] via SUBCUTANEOUS
  Filled 2022-02-02 (×8): qty 0.1

## 2022-02-02 MED ORDER — WARFARIN - PHARMACIST DOSING INPATIENT: Freq: Every day | Status: DC

## 2022-02-02 MED ORDER — ACETAMINOPHEN 325 MG PO TABS
650.0000 mg | ORAL_TABLET | Freq: Four times a day (QID) | ORAL | Status: DC | PRN
  Administered 2022-02-05 – 2022-02-08 (×6): 650 mg via ORAL
  Filled 2022-02-02 (×6): qty 2

## 2022-02-02 MED ORDER — WARFARIN SODIUM 2.5 MG PO TABS
2.5000 mg | ORAL_TABLET | ORAL | Status: DC
  Administered 2022-02-03 – 2022-02-05 (×2): 2.5 mg via ORAL
  Filled 2022-02-02 (×2): qty 1

## 2022-02-02 NOTE — Assessment & Plan Note (Addendum)
BP is in normal range - may be higher but now low due to sepsis. No antihypertensive medication on med list.  Plan Close monitoring

## 2022-02-02 NOTE — Assessment & Plan Note (Signed)
Patient does endorse SOB/DOE, worse when she is sick. She is sedentary. Last Echo 03/14/15 with Grade I DD and mild LVH. Lungs w/o rales - suboptimal exam due to girth and immobility.   Plan Complete Echo  Medication recommendations to follow: may benefit from diuretic and ARB

## 2022-02-02 NOTE — Assessment & Plan Note (Signed)
Patient with yeast infection under panniculus.  Plan  nystatin powder to affected area BID

## 2022-02-02 NOTE — Assessment & Plan Note (Signed)
Patient presented with leukocytosis, fever to 103.3 HR 125, lacitic acid 2.4 and mental status changes. Code sepsis initiated: patient received 2L LR bolus, Rocephin IV, Vancomycin IV. She deferevesed to 99, HR slowed to 83, BP remained stable.  Plan Treat underlying infections

## 2022-02-02 NOTE — Hospital Course (Addendum)
43 y/of w/ DM2,morbid obesity bmi 43, chronic venous stasis ulceration worse on LLE, was found by neighbors slumped in her chair and minimally responsive after they did not hear from  x 12 hrs,EMS was called and  transported to [facility] for evaluation with a concern for UTI and or leg infection and sepsis. In ED she reported she could not get up from her chair at home, denied loss of consciousness or fall. She has a prior h/o  sepsis related to cellulitis  In ED: FEVER W/ temp 103.3 HR 125  BP 126/62. Patient was very confused and and disoriented. She was found to have a UTI but also had a red angry left LE. Code Sepsis initiated and patient received 2L LR, IV Rocephin, vancomycin.Lactic acid #1 2.4, # 2 1.8. WBC 29.5 with 80/13/6.  Patient was admitted

## 2022-02-02 NOTE — Assessment & Plan Note (Signed)
Patient on warfarin for h/o recurrent DVT  Plan Pharmacy consult warfarin mgt.

## 2022-02-02 NOTE — Assessment & Plan Note (Signed)
Patient with h/o venous stasis ulcers chronically left LE. She has had cellulitis before with accompanying sepsis. She now has a recurrent shallow ulcer at the dorsal foot with rising erythema to above the knee with tenderness and heat. WBC 29.5 with 80/13/6.  Plan Blood cx sent to lab  Continue Vancomycin dosing per pharmacy consult  Continue Rocephin 1 g q 24  Cleansing and Dry dressing to shallow open wound BID  Elevation of leg  F/u Lab

## 2022-02-02 NOTE — Subjective & Objective (Addendum)
Ms. Kincannon, a 61 y/o follow for DM2, morbid obesity, chronic venous stasis ulceration, worse LLE, was found by neighbors slumped in her chair and minimally responsive. EMS activated and patient transported to [facility] for evaluation with a concern for UTI and or leg infection and sepsis. She has a prior h/o  sepsis related to cellulitis

## 2022-02-02 NOTE — Progress Notes (Signed)
   02/02/22 1725  Notify: Provider  Provider Name/Title Antonieta Pert, MD  Date Provider Notified 02/02/22  Time Provider Notified 1726  Method of Notification Page  Notification Reason Other (Comment) (refusal of lasix)  Provider response No new orders  Date of Provider Response 02/02/22  Time of Provider Response 1727

## 2022-02-02 NOTE — ED Notes (Signed)
ED TO INPATIENT HANDOFF REPORT   S Name/Age/Gender Brittany Tran 61 y.o. female Room/Bed: 005C/005C  Code Status: Full Code  From Home / Alert and oriented x4    Chief Complaint Weakness  Triage Note Pt BIB EMS for generalized weakness. Friends found her halfway out of her recliner and called EMS. They were worried when they had not heard from her in the past 12 hours, as she is a diabetic. EMS thinks she possibly may have a UTI, pt also was not able to tolerate ambulating and was confused axox3.   Pt slightly tachy with HR in the low 100s. Other VSS.   Allergies Allergies  Allergen Reactions   Penicillins Hives    Has patient had a PCN reaction causing immediate rash, facial/tongue/throat swelling, SOB or lightheadedness with hypotension: Yes Has patient had a PCN reaction causing severe rash involving mucus membranes or skin necrosis: No Has patient had a PCN reaction that required hospitalization: No Has patient had a PCN reaction occurring within the last 10 years: No If all of the above answers are "NO", then may proceed with Cephalosporin use.    Vancomycin Rash    Developed a red rash bilaterally on her arms, legs, neck, chest and abdomen   Penicillin G Benzathine     Other reaction(s): rash   Sulfa Antibiotics Hives and Rash    Other reaction(s): Other (See Comments) Other reaction(s): Unknown    Level of Care/Admitting Diagnosis ED Disposition     ED Disposition  Admit   Condition  --   Comment  The patient appears reasonably stabilized for admission considering the current resources, flow, and capabilities available in the ED at this time, and I doubt any other [facility] requiring further screening and/or treatment in the ED prior to admission is  present.          B Medical/Surgery History Past Medical History:  Diagnosis Date   Cellulitis    Diabetes mellitus without complication (HCC)    Diastolic heart failure (HCC)    Echo 03/14/15 Grade I  DD   Fibromyalgia    History of DVT (deep vein thrombosis)    Hypertension    Rheumatoid arthritis ([place])    Sepsis ([place]) 12/2019   Past Surgical History:  Procedure Laterality Date   HARDWARE REMOVAL Left 09/29/2014   Procedure: REMOVAL OF DEEP IMPLANT OF LEFT FIBULA  AND TIBIA (SEPARATE INCISIONS);  Surgeon: Wylene Simmer, MD;  Location: [facility];  Service: Orthopedics;  Laterality: Left;   ORIF ANKLE FRACTURE  1988   left     A IV Location/Drains/Wounds Patient Lines/Drains/Airways Status     Active Line/Drains/Airways     Name Placement date Placement time Site Days   Peripheral IV 02/01/22 20 G Right Antecubital 02/01/22  1615  Antecubital  1   Peripheral IV 02/01/22 20 G Left Antecubital 02/01/22  1616  Antecubital  1   Wound / Incision (Open or Dehisced) 05/19/18 Venous stasis ulcer;Puncture Leg Left oozing 05/19/18  0330  Leg  1355            Intake/Output Last 24 hours  Intake/Output Summary (Last 24 hours) at 02/02/2022 0210 Last data filed at 02/01/2022 2109 Gross per 24 hour  Intake 2100.1 ml  Output --  Net 2100.1 ml    Labs/Imaging Results for orders placed or performed during the hospital encounter of 02/01/22 (from the past 48 hour(s))  Comprehensive metabolic panel     Status: Abnormal  Collection Time: 02/01/22  4:12 PM  Result Value Ref Range   Sodium 141 135 - 145 mmol/L   Potassium 3.8 3.5 - 5.1 mmol/L   Chloride 109 98 - 111 mmol/L   CO2 25 22 - 32 mmol/L   Glucose, Bld 138 (H) 70 - 99 mg/dL    Comment: Glucose reference range applies only to samples taken after fasting for at least 8 hours.   BUN 14 8 - 23 mg/dL   Creatinine, Ser 1.06 (H) 0.44 - 1.00 mg/dL   Calcium 8.4 (L) 8.9 - 10.3 mg/dL   Total Protein 7.2 6.5 - 8.1 g/dL   Albumin 2.9 (L) 3.5 - 5.0 g/dL   AST 27 15 - 41 U/L   ALT 11 0 - 44 U/L   Alkaline Phosphatase 104 38 - 126 U/L   Total Bilirubin 1.1 0.3 - 1.2 mg/dL   GFR, Estimated 59 (L) >60 mL/min    Comment:  (NOTE) Calculated using the CKD-EPI Creatinine Equation (2021)    Anion gap 7 5 - 15    Comment: Performed at [facility] [address], [place], [place] [address]  CBC     Status: Abnormal   Collection Time: 02/01/22  4:12 PM  Result Value Ref Range   WBC 29.5 (H) 4.0 - 10.5 K/uL   RBC 3.96 3.87 - 5.11 MIL/uL   Hemoglobin 13.1 12.0 - 15.0 g/dL   HCT 39.5 36.0 - 46.0 %   MCV 99.7 80.0 - 100.0 fL   MCH 33.1 26.0 - 34.0 pg   MCHC 33.2 30.0 - 36.0 g/dL   RDW 15.8 (H) 11.5 - 15.5 %   Platelets 196 150 - 400 K/uL   nRBC 0.0 0.0 - 0.2 %    Comment: Performed at [facility], [place] [address] [place], [place] [address]  Lactic acid, plasma     Status: Abnormal   Collection Time: 02/01/22  4:12 PM  Result Value Ref Range   Lactic Acid, Venous 2.4 (HH) 0.5 - 1.9 mmol/L    Comment: CRITICAL RESULT CALLED TO, READ BACK BY AND VERIFIED WITH C ORTEGA RN 1702 02/01/2022 BY R VERAAR Performed at [facility], [place] [address] [place], [place] [address] CORRECTED ON 08/18 AT 2034: PREVIOUSLY REPORTED AS 2.4 CRITICAL RESULT CALLED TO, READ BACK BY AND VERIFIED WITH C ORTEGA 1702 02/01/2022 BY R VERAAR   Protime-INR     Status: Abnormal   Collection Time: 02/01/22  4:12 PM  Result Value Ref Range   Prothrombin Time 30.1 (H) 11.4 - 15.2 seconds   INR 2.9 (H) 0.8 - 1.2    Comment: (NOTE) INR goal varies based on device and disease states. Performed at [facility], [place] [address], [place], [place] [address]   Ethanol     Status: None   Collection Time: 02/01/22  4:30 PM  Result Value Ref Range   Alcohol, Ethyl (B) <10 <10 mg/dL    Comment: (NOTE) Lowest detectable limit for serum alcohol is 10 mg/dL.  For medical purposes only. Performed at [facility], [place] [address] [place], [place] [address]   Lactic acid, plasma     Status: None   Collection Time: 02/01/22  6:31 PM  Result Value Ref Range   Lactic Acid, Venous 1.8 0.5 - 1.9 mmol/L    Comment:  Performed at [facility] [address] [place], [place] [address]  Urinalysis, Routine w reflex microscopic  Urine, In & Out Cath     Status: Abnormal   Collection Time: 02/01/22 10:42 PM  Result Value Ref Range   Color, Urine YELLOW YELLOW   APPearance HAZY (A) CLEAR   Specific Gravity, Urine >1.046 (H) 1.005 - 1.030   pH 7.0 5.0 - 8.0   Glucose, UA NEGATIVE NEGATIVE mg/dL   Hgb urine dipstick SMALL (A) NEGATIVE   Bilirubin Urine NEGATIVE NEGATIVE   Ketones, ur NEGATIVE NEGATIVE mg/dL   Protein, ur 30 (A) NEGATIVE mg/dL   Nitrite NEGATIVE NEGATIVE   Leukocytes,Ua LARGE (A) NEGATIVE   RBC / HPF 11-20 0 - 5 RBC/hpf   WBC, UA >50 (H) 0 - 5 WBC/hpf   Bacteria, UA RARE (A) NONE SEEN   Squamous Epithelial / LPF 0-5 0 - 5    Comment: Performed at [facility], [place] [address] [place], [place] [address]  Rapid urine drug screen (hospital performed)     Status: None   Collection Time: 02/01/22 10:43 PM  Result Value Ref Range   Opiates NONE DETECTED NONE DETECTED   Cocaine NONE DETECTED NONE DETECTED   Benzodiazepines NONE DETECTED NONE DETECTED   Amphetamines NONE DETECTED NONE DETECTED   Tetrahydrocannabinol NONE DETECTED NONE DETECTED   Barbiturates NONE DETECTED NONE DETECTED    Comment: (NOTE) DRUG SCREEN FOR MEDICAL PURPOSES ONLY.  IF CONFIRMATION IS NEEDED FOR ANY PURPOSE, NOTIFY LAB WITHIN 5 DAYS.  LOWEST DETECTABLE LIMITS FOR URINE DRUG SCREEN Drug Class                     Cutoff (ng/mL) Amphetamine and metabolites    1000 Barbiturate and metabolites    200 Benzodiazepine                 270 Tricyclics and metabolites     300 Opiates and metabolites        300 Cocaine and metabolites        300 THC                            50 Performed at [facility], [place] [address], [place], [place] [address]   Troponin I (High Sensitivity)     Status: Abnormal   Collection Time: 02/01/22 10:46 PM  Result Value Ref Range   Troponin I (High Sensitivity) 30  (H) <18 ng/L    Comment: (NOTE) Elevated high sensitivity troponin I (hsTnI) values and significant  changes across serial measurements may suggest ACS but many other  chronic and acute conditions are known to elevate hsTnI results.  Refer to the "Links" section for chest pain algorithms and additional  guidance. Performed at [facility], [place] [address] [place], [place] [address]   Troponin I (High Sensitivity)     Status: Abnormal   Collection Time: 02/02/22 12:25 AM  Result Value Ref Range   Troponin I (High Sensitivity) 29 (H) <18 ng/L    Comment: (NOTE) Elevated high sensitivity troponin I (hsTnI) values and significant  changes across serial measurements may suggest ACS but many other  chronic and acute conditions are known to elevate hsTnI results.  Refer to the "Links" section for chest pain algorithms and additional  guidance. Performed at [facility], [place] [address] [place], [place] [address]    CT ABDOMEN PELVIS W CONTRAST  Result Date: 02/01/2022 CLINICAL DATA:  Generalized weakness. EXAM: CT ABDOMEN AND PELVIS WITH CONTRAST  TECHNIQUE: Multidetector CT imaging of the abdomen and pelvis was performed using the standard protocol following bolus administration of intravenous contrast. RADIATION DOSE REDUCTION: This exam was performed according to the departmental dose-optimization program which includes automated exposure control, adjustment of the mA and/or kV according to patient size and/or use of iterative reconstruction technique. CONTRAST:  183m OMNIPAQUE IOHEXOL 350 MG/ML SOLN COMPARISON:  None Available. FINDINGS: Lower chest: Mild atelectasis is seen within the bilateral lung bases. Hepatobiliary: There is diffuse fatty infiltration of the liver parenchyma. No focal liver abnormality is seen. Ill-defined gallstones are seen within the gallbladder lumen, without evidence of gallbladder wall thickening or biliary dilatation. Pancreas: Diffuse fatty  infiltration of the pancreatic parenchyma is seen. No pancreatic ductal dilatation or surrounding inflammatory changes. Spleen: Normal in size without focal abnormality. Adrenals/Urinary Tract: The bilateral adrenal glands are unremarkable. A 13.1 cm by 9.0 cm x 12.2 cm simple cyst is seen within the posterolateral aspect of the right kidney. A 4.8 cm x 3.4 cm x 4.1 cm exophytic, heterogeneous mildly hyperdense lesion (approximately 82.46 Hounsfield units) is seen within the anterior aspect of the mid to upper right kidney. A 1.8 cm x 1.4 cm x 2.1 cm cystic appearing area is seen adjacent to the posterolateral aspect of the mid left kidney (approximately 49.00 Hounsfield units). Bilateral 2 mm, 3 mm and 4 mm nonobstructing renal calculi are seen. There is no evidence of hydronephrosis or hydroureter. The urinary bladder is poorly distended and subsequently limited in evaluation. Stomach/Bowel: Stomach is within normal limits. Appendix appears normal. No evidence of bowel wall thickening, distention, or inflammatory changes. Vascular/Lymphatic: No significant vascular findings are present. Multiple tiny para-aortic and aortocaval lymph nodes are noted. A 2.2 cm x 1.5 cm x 1.9 cm posterior left iliac chain lymph node is seen. Several adjacent tiny pelvic lymph nodes are also noted. Reproductive: Uterus and bilateral adnexa are unremarkable. Other: There is a small fat containing umbilical hernia. No abdominopelvic ascites. Musculoskeletal: No acute or significant osseous findings. IMPRESSION: 1. 4.8 cm x 3.4 cm x 4.1 cm exophytic lesion within the anterior aspect of the mid to upper right kidney which may represent a hemorrhagic cyst. Correlation with MRI (without and with IV contrast) is recommended to exclude the presence of an underlying neoplastic process. 2. Very large simple right renal cyst with a much smaller exophytic cystic-appearing area seen along the posteromedial aspect of the mid left kidney. 3.  Bilateral nonobstructing renal calculi. 4. Cholelithiasis. 5. Enlarged posterior left iliac chain lymph node which may be reactive in nature. Electronically Signed   By: TVirgina NorfolkM.D.   On: 02/01/2022 20:36   CT Angio Chest PE W and/or Wo Contrast  Result Date: 02/01/2022 CLINICAL DATA:  Generalized weakness and history of recurrent DVT. EXAM: CT ANGIOGRAPHY CHEST WITH CONTRAST TECHNIQUE: Multidetector CT imaging of the chest was performed using the standard protocol during bolus administration of intravenous contrast. Multiplanar CT image reconstructions and MIPs were obtained to evaluate the vascular anatomy. RADIATION DOSE REDUCTION: This exam was performed according to the departmental dose-optimization program which includes automated exposure control, adjustment of the mA and/or kV according to patient size and/or use of iterative reconstruction technique. CONTRAST:  1050mOMNIPAQUE IOHEXOL 350 MG/ML SOLN COMPARISON:  None Available. FINDINGS: Cardiovascular: There is mild calcification of the aortic arch, without evidence of aortic aneurysm. The segmental and subsegmental pulmonary arteries are limited in evaluation secondary to suboptimal opacification with intravenous contrast and motion artifact. No evidence of pulmonary  embolism. Prominence of the perihilar pulmonary vasculature is noted. There is mild cardiomegaly. No pericardial effusion. Mediastinum/Nodes: No enlarged mediastinal, hilar, or axillary lymph nodes. Thyroid gland, trachea, and esophagus demonstrate no significant findings. Lungs/Pleura: Low lung volumes are seen which may be, in part, secondary to the degree of patient inspiration. Mild atelectatic changes are seen within the bilateral lung bases. There is no evidence of acute infiltrate, pleural effusion or pneumothorax. Upper Abdomen: No acute abnormality. Musculoskeletal: Degenerative changes are seen involving both shoulders, with multilevel degenerative changes noted  throughout the thoracic spine. Review of the MIP images confirms the above findings. IMPRESSION: 1. Limited evaluation of the segmental and subsegmental pulmonary arteries, as described above, without definite evidence of pulmonary embolism. 2. Mild cardiomegaly with mild to moderate severity pulmonary vascular congestion. 3. Mild bibasilar atelectasis. 4. Aortic atherosclerosis. Aortic Atherosclerosis (ICD10-I70.0). Electronically Signed   By: Virgina Norfolk M.D.   On: 02/01/2022 20:18   DG Chest Port 1 View  Result Date: 02/01/2022 CLINICAL DATA:  Possible sepsis. EXAM: PORTABLE CHEST 1 VIEW COMPARISON:  Radiograph 12/28/2019 FINDINGS: Low lung volumes with moderate patient rotation. Suspected cardiomegaly. Patchy left lung base opacity. No pulmonary edema. No large pleural effusion or visualized pneumothorax. On limited assessment, no acute osseous findings. IMPRESSION: Low lung volumes with moderate patient rotation. Patchy left lung base opacity may be atelectasis or pneumonia. Electronically Signed   By: Keith Rake M.D.   On: 02/01/2022 17:19   CT Head Wo Contrast  Result Date: 02/01/2022 CLINICAL DATA:  Altered mental status EXAM: CT HEAD WITHOUT CONTRAST TECHNIQUE: Contiguous axial images were obtained from the base of the skull through the vertex without intravenous contrast. RADIATION DOSE REDUCTION: This exam was performed according to the departmental dose-optimization program which includes automated exposure control, adjustment of the mA and/or kV according to patient size and/or use of iterative reconstruction technique. COMPARISON:  None Available. FINDINGS: Brain: No acute intracranial findings are seen in noncontrast CT brain. There are no signs of bleeding within the cranium. Ventricles are nondilated. Cortical sulci are prominent. Vascular: Scattered arterial calcifications are seen. Skull: Unremarkable. Sinuses/Orbits: Unremarkable. Other: None. IMPRESSION: No acute intracranial  findings are seen in noncontrast CT brain. Atrophy. Electronically Signed   By: Elmer Picker M.D.   On: 02/01/2022 16:58    Pending Labs Unresulted Labs (From admission, onward)     Start     Ordered   02/02/22 0158  Hemoglobin A1c  Once,   URGENT       Comments: To assess prior glycemic control    02/02/22 0157   02/01/22 1621  APTT  (Undifferentiated presentation (screening labs and basic nursing orders))  ONCE - STAT,   STAT        02/01/22 1622   02/01/22 1621  Urine Culture  (Undifferentiated presentation (screening labs and basic nursing orders))  ONCE - URGENT,   URGENT       Question:  Indication  Answer:  Sepsis   02/01/22 1622   02/01/22 1558  Culture, blood (Routine x 2)  BLOOD CULTURE X 2,   R      02/01/22 1558            Vitals/Pain Today's Vitals   02/02/22 0134 02/02/22 0145 02/02/22 0200 02/02/22 0209  BP:  126/71 135/70   Pulse:  81 84   Resp:  20 (!) 23   Temp:      TempSrc:      SpO2:  95% 95%  Weight:      Height:      PainSc: 0-No pain   Asleep    Isolation Precautions No active isolations  Medications Medications  lactated ringers infusion ( Intravenous New Bag/Given 02/01/22 2335)  vancomycin (VANCOREADY) IVPB 750 mg/150 mL (has no administration in time range)  acetaminophen (TYLENOL) tablet 650 mg (has no administration in time range)  LORazepam (ATIVAN) tablet 0.5 mg (has no administration in time range)  folic acid (FOLVITE) tablet 2 mg (has no administration in time range)  warfarin (COUMADIN) tablet 5 mg (has no administration in time range)  gabapentin (NEURONTIN) capsule 400 mg (400 mg Oral Given 02/02/22 0132)  gabapentin (NEURONTIN) capsule 300 mg (has no administration in time range)  topiramate (TOPAMAX) tablet 25 mg (25 mg Oral Given 02/02/22 0132)  predniSONE (DELTASONE) tablet 5 mg (has no administration in time range)  nystatin (MYCOSTATIN/NYSTOP) topical powder (has no administration in time range)  cefTRIAXone  (ROCEPHIN) 1 g in sodium chloride 0.9 % 100 mL IVPB (has no administration in time range)  insulin glargine-yfgn (SEMGLEE) injection 10 Units (has no administration in time range)  insulin aspart (novoLOG) injection 0-20 Units (has no administration in time range)  lactated ringers bolus 1,000 mL (0 mLs Intravenous Stopped 02/01/22 1842)  cefTRIAXone (ROCEPHIN) 2 g in sodium chloride 0.9 % 100 mL IVPB (0 g Intravenous Stopped 02/01/22 1843)  lactated ringers bolus 1,000 mL (0 mLs Intravenous Stopped 02/01/22 2109)  acetaminophen (TYLENOL) tablet 1,000 mg (1,000 mg Oral Given 02/01/22 1911)  iohexol (OMNIPAQUE) 350 MG/ML injection 100 mL (100 mLs Intravenous Contrast Given 02/01/22 1954)  vancomycin (VANCOREADY) IVPB 1750 mg/350 mL (0 mg Intravenous Stopped 02/02/22 0054)  diphenhydrAMINE (BENADRYL) injection 25 mg (25 mg Intravenous Given 02/01/22 2240)    Mobility ; Ambulatory with assistance       R Recommendations: See Admitting Provider Note

## 2022-02-02 NOTE — Assessment & Plan Note (Signed)
By CT abdomen patient with 4.8x3.4x4.1 exophytic lesion anterior aspect mid-upper right kidney. Hemorrhagic cyst vs tumor  Plan MRI renal when patient more stable. (may exceed weight limit for MRI)

## 2022-02-02 NOTE — Assessment & Plan Note (Signed)
Patient followed by Dr. Trudie Reed. She takes an over the counter medication which she claims controls her DM. Does not recall last A1C. In Epic last A1C 9.1% 12/30/19.  Plan A1C  Basal insulin 10 qHS in setting of active infection  SS  Low carb diet

## 2022-02-02 NOTE — Assessment & Plan Note (Signed)
Patient denies any joint flare. Continues to take MTX and low dose prednisone.  Plan Hold MTX  Stress dose of steroids - 5 mg prednisone/day

## 2022-02-02 NOTE — Progress Notes (Signed)
PROGRESS NOTE Brittany Tran  EXN:[id number] DOB: 1960/07/26 DOA: 02/01/2022 PCP: Curt Jews, PA-C   Brief Narrative/Hospital Course: 14 y/of w/ DM2,morbid obesity bmi 43, chronic venous stasis ulceration worse on LLE, was found by neighbors slumped in her chair and minimally responsive after they did not hear from  x 12 hrs,EMS was called and  transported to [facility] for evaluation with a concern for UTI and or leg infection and sepsis. In ED she reported she could not get up from her chair at home, denied loss of consciousness or fall. She has a prior h/o  sepsis related to cellulitis  In ED: FEVER W/ temp 103.3 HR 125  BP 126/62. Patient was very confused and and disoriented. She was found to have a UTI but also had a red angry left LE. Code Sepsis initiated and patient received 2L LR, IV Rocephin, vancomycin.Lactic acid #1 2.4, # 2 1.8. WBC 29.5 with 80/13/6.  Patient was admitted     Subjective: Seen and examined this morning.  Resting comfortably alert awake no new complaints, edematous lower extremities.  Complains of dysuria She reports she could not get up from her chair at home, denies loss of consciousness or fall She is afebrile since admission.  Assessment and Plan: Principal Problem:   Cellulitis of left leg Active Problems:   Sepsis due to cellulitis ([place])   Cellulitis of left lower extremity   Diabetes mellitus without complication (HCC)   Chronic diastolic CHF (congestive heart failure) (HCC)   Anticoagulation goal of INR 2 to 3   Renal lesion   Rheumatoid arthritis (HCC)   Hypertension   Skin yeast infection   Sepsis POA due to cellulitis UTI Left lower extremity cellulitis UTI: Has venous stasis ulcer chronically with edematous lower extremity more on left and had previous cellulitis.  Follow-up blood culture continue current vancomycin, Rocephin.currently hemodynamically stable, lactic acidosis  resolved.Monitor WBC count, elevate leg. Recent Labs  Lab  02/01/22 1612 02/01/22 1831  WBC 29.5*  --   LATICACIDVEN 2.4* 1.8    Renal lesion:CT abdomen patient with 4.8x3.4x4.1 exophytic lesion anterior aspect mid-upper right kidney. Hemorrhagic cyst vs tumor.  Will need MRI when patient more stable. UA w / rbs 11-20.  History of recurrent DVT : anticoagulated on Coumadin pharmacy dosing, currently therapeutic Recent Labs  Lab 02/01/22 1612 02/02/22 0352  INR 2.9* 2.9*    Chronic diastolic CHF Extensive lower leg edema: Patient does endorse SOB/DOE, worse when she is sick. She is sedentary. Last Echo 03/14/15 with Grade I DD and mild LVH.  Lungs are fairly clear but suboptimal exam due to her body size, follow-up echocardiogram.  CTA limited but no definitive PE, has mild cardiomegaly with mild to moderate severity pulmonary vascular congestion-will benefit with diuresis-we will check BNP/labs. Stop ivf as lactic acid normal. Intake/Output Summary (Last 24 hours) at 02/02/2022 1152 Last data filed at 02/02/2022 0657 Gross per 24 hour  Intake 3113.96 ml  Output --  Net 3113.96 ml    Diabetes mellitus on long-term insulin: Current A1c 6.4.  Stable, continue her 10 units Semglee, SSI and monitor Recent Labs  Lab 02/02/22 0025 02/02/22 0350 02/02/22 1047  GLUCAP  --  97 79  HGBA1C 6.4*  --   --     Skin yeast infection under panniculus: cont nystatin powder bid  Hypertension: BP well controlled no hypotension.  Home meds on hold in the setting of sepsis  Rheumatoid arthritis: Without acute flareup, continue to hold methotrexate and  cont low-dose prednisone  Morbid obesity BMI 43 Will benefit with weight loss I spent 30 minutes in the care of this patient excluding procedure  DVT prophylaxis: coumadin Code Status:   Code Status: Full Code Family Communication: plan of care discussed with patient at bedside. Patient status is: Inpatient because of ongoing management of infection Level of care: Telemetry Medical   Dispo: The patient  is from: home lives by herself, reports she is mobile            Anticipated disposition: TBD obtain PT OT  Mobility Assessment (last 72 hours)     Mobility Assessment   No documentation.            Objective: Vitals last 24 hrs: Vitals:   02/02/22 0230 02/02/22 0300 02/02/22 0500 02/02/22 0900  BP: (!) 143/72 139/62 132/64 (!) 122/109  Pulse: 87 85 87 79  Resp: (!) 21 (!) '21 18 16  '$ Temp:   98.3 F (36.8 C)   TempSrc:      SpO2: 95% 96% 95% 99%  Weight:      Height:       Weight change:   Physical Examination: General exam: alert awake,older than stated age, weak appearing. HEENT:Oral mucosa moist, Ear/Nose WNL grossly, dentition normal. Respiratory system: bilaterally diminished BS, no use of accessory muscle Cardiovascular system: S1 & S2 +, No JVD. Gastrointestinal system: Abdomen soft,NT,ND, BS+ Nervous System:Alert, awake, moving extremities and grossly nonfocal Extremities: LE edema ++ w/ hyperpigmentation of lower extremities mild tenderness Skin: No rashes,no icterus. MSK: Normal muscle bulk,tone, power  Medications reviewed:  Scheduled Meds:  folic acid  2 mg Oral Daily   gabapentin  300 mg Oral TID   gabapentin  400 mg Oral QHS   insulin aspart  0-20 Units Subcutaneous TID WC   insulin glargine-yfgn  10 Units Subcutaneous QHS   nystatin   Topical BID   predniSONE  5 mg Oral Q breakfast   topiramate  25 mg Oral BID   [START ON 02/03/2022] warfarin  2.5 mg Oral Once per day on Sun Tue Thu   warfarin  5 mg Oral Once per day on Mon Wed Fri Sat   Warfarin - Pharmacist Dosing Inpatient   Does not apply q1600   Continuous Infusions:  cefTRIAXone (ROCEPHIN)  IV     lactated ringers Stopped (02/02/22 0657)   vancomycin        Diet Order             Diet heart healthy/carb modified Room service appropriate? Yes; Fluid consistency: Thin  Diet effective now                  Intake/Output Summary (Last 24 hours) at 02/02/2022 1149 Last data filed  at 02/02/2022 0657 Gross per 24 hour  Intake 3113.96 ml  Output --  Net 3113.96 ml   Net IO Since Admission: 3,113.96 mL [02/02/22 1149]  Wt Readings from Last 3 Encounters:  02/01/22 123.4 kg  01/09/22 123.4 kg  12/04/21 131.1 kg     Unresulted Labs (From admission, onward)     Start     Ordered   02/02/22 0500  Protime-INR  Daily at 5am,   R      02/02/22 0212   02/01/22 1621  APTT  (Undifferentiated presentation (screening labs and basic nursing orders))  ONCE - STAT,   STAT        02/01/22 1622   02/01/22 1621  Urine Culture  (Undifferentiated presentation (  screening labs and basic nursing orders))  ONCE - URGENT,   URGENT       Question:  Indication  Answer:  Sepsis   02/01/22 1622          Data Reviewed: I have personally reviewed following labs and imaging studies CBC: Recent Labs  Lab 02/01/22 1612  WBC 29.5*  HGB 13.1  HCT 39.5  MCV 99.7  PLT 364   Basic Metabolic Panel: Recent Labs  Lab 02/01/22 1612  NA 141  K 3.8  CL 109  CO2 25  GLUCOSE 138*  BUN 14  CREATININE 1.06*  CALCIUM 8.4*   GFR: Estimated Creatinine Clearance: 73.8 mL/min (A) (by C-G formula based on SCr of 1.06 mg/dL (H)). Liver Function Tests: Recent Labs  Lab 02/01/22 1612  AST 27  ALT 11  ALKPHOS 104  BILITOT 1.1  PROT 7.2  ALBUMIN 2.9*   No results for input(s): "LIPASE", "AMYLASE" in the last 168 hours. No results for input(s): "AMMONIA" in the last 168 hours. Coagulation Profile: Recent Labs  Lab 02/01/22 1612 02/02/22 0352  INR 2.9* 2.9*   BNP (last 3 results) No results for input(s): "PROBNP" in the last 8760 hours. HbA1C: Recent Labs    02/02/22 0025  HGBA1C 6.4*   CBG: Recent Labs  Lab 02/02/22 0350 02/02/22 1047  GLUCAP 97 79   Lipid Profile: No results for input(s): "CHOL", "HDL", "LDLCALC", "TRIG", "CHOLHDL", "LDLDIRECT" in the last 72 hours. Thyroid Function Tests: No results for input(s): "TSH", "T4TOTAL", "FREET4", "T3FREE", "THYROIDAB"  in the last 72 hours. Sepsis Labs: Recent Labs  Lab 02/01/22 1612 02/01/22 1831  LATICACIDVEN 2.4* 1.8    Recent Results (from the past 240 hour(s))  Culture, blood (Routine x 2)     Status: None (Preliminary result)   Collection Time: 02/01/22  4:03 PM   Specimen: BLOOD  Result Value Ref Range Status   Specimen Description BLOOD LEFT ANTECUBITAL  Final   Special Requests   Final    BOTTLES DRAWN AEROBIC AND ANAEROBIC Blood Culture adequate volume   Culture   Final    NO GROWTH < 24 HOURS Performed at [facility], [place] [address], [place],  [address]    Report Status PENDING  Incomplete  Culture, blood (Routine x 2)     Status: None (Preliminary result)   Collection Time: 02/01/22  4:11 PM   Specimen: BLOOD  Result Value Ref Range Status   Specimen Description BLOOD RIGHT ANTECUBITAL  Final   Special Requests   Final    BOTTLES DRAWN AEROBIC AND ANAEROBIC Blood Culture results may not be optimal due to an excessive volume of blood received in culture bottles   Culture   Final    NO GROWTH < 24 HOURS Performed at [facility], [place] [address] [place],  [address]    Report Status PENDING  Incomplete    Antimicrobials: Anti-infectives (From admission, onward)    Start     Dose/Rate Route Frequency Ordered Stop   02/02/22 1700  cefTRIAXone (ROCEPHIN) 1 g in sodium chloride 0.9 % 100 mL IVPB        1 g 200 mL/hr over 30 Minutes Intravenous Every 24 hours 02/02/22 0154     02/02/22 1100  vancomycin (VANCOREADY) IVPB 750 mg/150 mL        750 mg 75 mL/hr over 120 Minutes Intravenous Every 12 hours 02/01/22 2229     02/01/22 2145  vancomycin (VANCOREADY) IVPB 1750 mg/350  mL        1,750 mg 116.7 mL/hr over 180 Minutes Intravenous  Once 02/01/22 2143 02/02/22 0054   02/01/22 1645  cefTRIAXone (ROCEPHIN) 2 g in sodium chloride 0.9 % 100 mL IVPB        2 g 200 mL/hr over 30 Minutes Intravenous  Once 02/01/22 1633 02/01/22 1843       Culture/Microbiology    Component Value Date/Time   SDES BLOOD RIGHT ANTECUBITAL 02/01/2022 1611   SPECREQUEST  02/01/2022 1611    BOTTLES DRAWN AEROBIC AND ANAEROBIC Blood Culture results may not be optimal due to an excessive volume of blood received in culture bottles   CULT  02/01/2022 1611    NO GROWTH < 24 HOURS Performed at [facility] [address], [place], [place] [address]    REPTSTATUS PENDING 02/01/2022 1611    Other culture-see note  Radiology Studies: CT ABDOMEN PELVIS W CONTRAST  Result Date: 02/01/2022 CLINICAL DATA:  Generalized weakness. EXAM: CT ABDOMEN AND PELVIS WITH CONTRAST TECHNIQUE: Multidetector CT imaging of the abdomen and pelvis was performed using the standard protocol following bolus administration of intravenous contrast. RADIATION DOSE REDUCTION: This exam was performed according to the departmental dose-optimization program which includes automated exposure control, adjustment of the mA and/or kV according to patient size and/or use of iterative reconstruction technique. CONTRAST:  152m OMNIPAQUE IOHEXOL 350 MG/ML SOLN COMPARISON:  None Available. FINDINGS: Lower chest: Mild atelectasis is seen within the bilateral lung bases. Hepatobiliary: There is diffuse fatty infiltration of the liver parenchyma. No focal liver abnormality is seen. Ill-defined gallstones are seen within the gallbladder lumen, without evidence of gallbladder wall thickening or biliary dilatation. Pancreas: Diffuse fatty infiltration of the pancreatic parenchyma is seen. No pancreatic ductal dilatation or surrounding inflammatory changes. Spleen: Normal in size without focal abnormality. Adrenals/Urinary Tract: The bilateral adrenal glands are unremarkable. A 13.1 cm by 9.0 cm x 12.2 cm simple cyst is seen within the posterolateral aspect of the right kidney. A 4.8 cm x 3.4 cm x 4.1 cm exophytic, heterogeneous mildly hyperdense lesion (approximately 82.46 Hounsfield units) is seen  within the anterior aspect of the mid to upper right kidney. A 1.8 cm x 1.4 cm x 2.1 cm cystic appearing area is seen adjacent to the posterolateral aspect of the mid left kidney (approximately 49.00 Hounsfield units). Bilateral 2 mm, 3 mm and 4 mm nonobstructing renal calculi are seen. There is no evidence of hydronephrosis or hydroureter. The urinary bladder is poorly distended and subsequently limited in evaluation. Stomach/Bowel: Stomach is within normal limits. Appendix appears normal. No evidence of bowel wall thickening, distention, or inflammatory changes. Vascular/Lymphatic: No significant vascular findings are present. Multiple tiny para-aortic and aortocaval lymph nodes are noted. A 2.2 cm x 1.5 cm x 1.9 cm posterior left iliac chain lymph node is seen. Several adjacent tiny pelvic lymph nodes are also noted. Reproductive: Uterus and bilateral adnexa are unremarkable. Other: There is a small fat containing umbilical hernia. No abdominopelvic ascites. Musculoskeletal: No acute or significant osseous findings. IMPRESSION: 1. 4.8 cm x 3.4 cm x 4.1 cm exophytic lesion within the anterior aspect of the mid to upper right kidney which may represent a hemorrhagic cyst. Correlation with MRI (without and with IV contrast) is recommended to exclude the presence of an underlying neoplastic process. 2. Very large simple right renal cyst with a much smaller exophytic cystic-appearing area seen along the posteromedial aspect of the mid left kidney. 3. Bilateral nonobstructing renal calculi. 4.  Cholelithiasis. 5. Enlarged posterior left iliac chain lymph node which may be reactive in nature. Electronically Signed   By: Virgina Norfolk M.D.   On: 02/01/2022 20:36   CT Angio Chest PE W and/or Wo Contrast  Result Date: 02/01/2022 CLINICAL DATA:  Generalized weakness and history of recurrent DVT. EXAM: CT ANGIOGRAPHY CHEST WITH CONTRAST TECHNIQUE: Multidetector CT imaging of the chest was performed using the standard  protocol during bolus administration of intravenous contrast. Multiplanar CT image reconstructions and MIPs were obtained to evaluate the vascular anatomy. RADIATION DOSE REDUCTION: This exam was performed according to the departmental dose-optimization program which includes automated exposure control, adjustment of the mA and/or kV according to patient size and/or use of iterative reconstruction technique. CONTRAST:  141m OMNIPAQUE IOHEXOL 350 MG/ML SOLN COMPARISON:  None Available. FINDINGS: Cardiovascular: There is mild calcification of the aortic arch, without evidence of aortic aneurysm. The segmental and subsegmental pulmonary arteries are limited in evaluation secondary to suboptimal opacification with intravenous contrast and motion artifact. No evidence of pulmonary embolism. Prominence of the perihilar pulmonary vasculature is noted. There is mild cardiomegaly. No pericardial effusion. Mediastinum/Nodes: No enlarged mediastinal, hilar, or axillary lymph nodes. Thyroid gland, trachea, and esophagus demonstrate no significant findings. Lungs/Pleura: Low lung volumes are seen which may be, in part, secondary to the degree of patient inspiration. Mild atelectatic changes are seen within the bilateral lung bases. There is no evidence of acute infiltrate, pleural effusion or pneumothorax. Upper Abdomen: No acute abnormality. Musculoskeletal: Degenerative changes are seen involving both shoulders, with multilevel degenerative changes noted throughout the thoracic spine. Review of the MIP images confirms the above findings. IMPRESSION: 1. Limited evaluation of the segmental and subsegmental pulmonary arteries, as described above, without definite evidence of pulmonary embolism. 2. Mild cardiomegaly with mild to moderate severity pulmonary vascular congestion. 3. Mild bibasilar atelectasis. 4. Aortic atherosclerosis. Aortic Atherosclerosis (ICD10-I70.0). Electronically Signed   By: TVirgina NorfolkM.D.   On:  02/01/2022 20:18   DG Chest Port 1 View  Result Date: 02/01/2022 CLINICAL DATA:  Possible sepsis. EXAM: PORTABLE CHEST 1 VIEW COMPARISON:  Radiograph 12/28/2019 FINDINGS: Low lung volumes with moderate patient rotation. Suspected cardiomegaly. Patchy left lung base opacity. No pulmonary edema. No large pleural effusion or visualized pneumothorax. On limited assessment, no acute osseous findings. IMPRESSION: Low lung volumes with moderate patient rotation. Patchy left lung base opacity may be atelectasis or pneumonia. Electronically Signed   By: MKeith RakeM.D.   On: 02/01/2022 17:19   CT Head Wo Contrast  Result Date: 02/01/2022 CLINICAL DATA:  Altered mental status EXAM: CT HEAD WITHOUT CONTRAST TECHNIQUE: Contiguous axial images were obtained from the base of the skull through the vertex without intravenous contrast. RADIATION DOSE REDUCTION: This exam was performed according to the departmental dose-optimization program which includes automated exposure control, adjustment of the mA and/or kV according to patient size and/or use of iterative reconstruction technique. COMPARISON:  None Available. FINDINGS: Brain: No acute intracranial findings are seen in noncontrast CT brain. There are no signs of bleeding within the cranium. Ventricles are nondilated. Cortical sulci are prominent. Vascular: Scattered arterial calcifications are seen. Skull: Unremarkable. Sinuses/Orbits: Unremarkable. Other: None. IMPRESSION: No acute intracranial findings are seen in noncontrast CT brain. Atrophy. Electronically Signed   By: PElmer PickerM.D.   On: 02/01/2022 16:58     LOS: 0 days   RAntonieta Pert MD [facility]  02/02/2022, 11:49 AM

## 2022-02-02 NOTE — ED Notes (Signed)
ED TO INPATIENT HANDOFF REPORT  S Name/Age/Gender Lenon Curt 61 y.o. female Room/Bed: 005C/005C  Code Status   Code Status: Full Code  Home/SNF/Other Home Patient oriented to: self, place, time, and situation Is this baseline? Yes   Triage Complete: Triage complete  Chief Complaint Cellulitis of left leg [X54.008]  Triage Note Pt BIB EMS for generalized weakness. Friends found her halfway out of her recliner and called EMS. They were worried when they had not heard from her in the past 12 hours, as she is a diabetic. EMS thinks she possibly may have a UTI, pt also was not able to tolerate ambulating and was confused axox3.   Pt slightly tachy with HR in the low 100s. Other VSS.   Allergies Allergies  Allergen Reactions   Penicillins Hives    Has patient had a PCN reaction causing immediate rash, facial/tongue/throat swelling, SOB or lightheadedness with hypotension: Yes Has patient had a PCN reaction causing severe rash involving mucus membranes or skin necrosis: No Has patient had a PCN reaction that required hospitalization: No Has patient had a PCN reaction occurring within the last 10 years: No If all of the above answers are "NO", then may proceed with Cephalosporin use.    Vancomycin Rash    Developed a red rash bilaterally on her arms, legs, neck, chest and abdomen   Penicillin G Benzathine Rash   Sulfa Antibiotics Hives and Rash    Other reaction(s): Other (See Comments) Other reaction(s): Unknown    Level of Care/Admitting Diagnosis ED Disposition     ED Disposition  Admit   Condition  --   [facility]: [facility] [[id number]]  Level of Care: Telemetry Medical [104]  May admit patient to [facility] or [facility] if equivalent level of care is available:: No  Covid Evaluation: Asymptomatic - no recent exposure (last 10 days) testing not required  Diagnosis: Cellulitis of left leg [676195]  Admitting Physician: Neena Rhymes [[id number]]  Attending Physician: Neena Rhymes [[id number]]  Certification:: I certify this patient will need inpatient services for at least 2 midnights  Estimated Length of Stay: 5          B Medical/Surgery History Past Medical History:  Diagnosis Date   Cellulitis    Diabetes mellitus without complication ([place])    Diastolic heart failure ([place])    Echo 03/14/15 Grade I DD   Fibromyalgia    History of DVT (deep vein thrombosis)    Hypertension    Rheumatoid arthritis ([place])    Sepsis ([place]) 12/2019   Past Surgical History:  Procedure Laterality Date   HARDWARE REMOVAL Left 09/29/2014   Procedure: REMOVAL OF DEEP IMPLANT OF LEFT FIBULA  AND TIBIA (SEPARATE INCISIONS);  Surgeon: Wylene Simmer, MD;  Location: [facility];  Service: Orthopedics;  Laterality: Left;   ORIF ANKLE FRACTURE  1988   left     A IV Location/Drains/Wounds Patient Lines/Drains/Airways Status     Active Line/Drains/Airways     Name Placement date Placement time Site Days   Peripheral IV 02/01/22 20 G Right Antecubital 02/01/22  1615  Antecubital  1   Peripheral IV 02/01/22 20 G Left Antecubital 02/01/22  1616  Antecubital  1   Wound / Incision (Open or Dehisced) 05/19/18 Venous stasis ulcer;Puncture Leg Left oozing 05/19/18  0330  Leg  1355            Intake/Output Last 24 hours  Intake/Output Summary (Last  24 hours) at 02/02/2022 1435 Last data filed at 02/02/2022 1153 Gross per 24 hour  Intake 3113.96 ml  Output 1000 ml  Net 2113.96 ml    Labs/Imaging Results for orders placed or performed during the hospital encounter of 02/01/22 (from the past 48 hour(s))  Culture, blood (Routine x 2)     Status: None (Preliminary result)   Collection Time: 02/01/22  4:03 PM   Specimen: BLOOD  Result Value Ref Range   Specimen Description BLOOD LEFT ANTECUBITAL    Special Requests      BOTTLES DRAWN AEROBIC AND ANAEROBIC Blood Culture adequate volume   Culture      NO GROWTH < 24  HOURS Performed at [facility] [address] [place], [place] [address]    Report Status PENDING   Culture, blood (Routine x 2)     Status: None (Preliminary result)   Collection Time: 02/01/22  4:11 PM   Specimen: BLOOD  Result Value Ref Range   Specimen Description BLOOD RIGHT ANTECUBITAL    Special Requests      BOTTLES DRAWN AEROBIC AND ANAEROBIC Blood Culture results may not be optimal due to an excessive volume of blood received in culture bottles   Culture      NO GROWTH < 24 HOURS Performed at [facility] [address], [place], [place] [address]    Report Status PENDING   Comprehensive metabolic panel     Status: Abnormal   Collection Time: 02/01/22  4:12 PM  Result Value Ref Range   Sodium 141 135 - 145 mmol/L   Potassium 3.8 3.5 - 5.1 mmol/L   Chloride 109 98 - 111 mmol/L   CO2 25 22 - 32 mmol/L   Glucose, Bld 138 (H) 70 - 99 mg/dL    Comment: Glucose reference range applies only to samples taken after fasting for at least 8 hours.   BUN 14 8 - 23 mg/dL   Creatinine, Ser 1.06 (H) 0.44 - 1.00 mg/dL   Calcium 8.4 (L) 8.9 - 10.3 mg/dL   Total Protein 7.2 6.5 - 8.1 g/dL   Albumin 2.9 (L) 3.5 - 5.0 g/dL   AST 27 15 - 41 U/L   ALT 11 0 - 44 U/L   Alkaline Phosphatase 104 38 - 126 U/L   Total Bilirubin 1.1 0.3 - 1.2 mg/dL   GFR, Estimated 59 (L) >60 mL/min    Comment: (NOTE) Calculated using the CKD-EPI Creatinine Equation (2021)    Anion gap 7 5 - 15    Comment: Performed at [facility] [address] [place], [place] [address]  CBC     Status: Abnormal   Collection Time: 02/01/22  4:12 PM  Result Value Ref Range   WBC 29.5 (H) 4.0 - 10.5 K/uL   RBC 3.96 3.87 - 5.11 MIL/uL   Hemoglobin 13.1 12.0 - 15.0 g/dL   HCT 39.5 36.0 - 46.0 %   MCV 99.7 80.0 - 100.0 fL   MCH 33.1 26.0 - 34.0 pg   MCHC 33.2 30.0 - 36.0 g/dL   RDW 15.8 (H) 11.5 - 15.5 %   Platelets 196 150 - 400 K/uL   nRBC 0.0 0.0 - 0.2 %    Comment: Performed at [facility], [place] [address], [place], [place] [address]  Lactic acid, plasma     Status: Abnormal   Collection Time: 02/01/22  4:12 PM  Result Value Ref Range   Lactic Acid,  Venous 2.4 (HH) 0.5 - 1.9 mmol/L    Comment: CRITICAL RESULT CALLED TO, READ BACK BY AND VERIFIED WITH C ORTEGA RN 1702 02/01/2022 BY R VERAAR Performed at [facility], [place] [address] [place], [place] [address] CORRECTED ON 08/18 AT 2034: PREVIOUSLY REPORTED AS 2.4 CRITICAL RESULT CALLED TO, READ BACK BY AND VERIFIED WITH C ORTEGA 1702 02/01/2022 BY R VERAAR   Protime-INR     Status: Abnormal   Collection Time: 02/01/22  4:12 PM  Result Value Ref Range   Prothrombin Time 30.1 (H) 11.4 - 15.2 seconds   INR 2.9 (H) 0.8 - 1.2    Comment: (NOTE) INR goal varies based on device and disease states. Performed at [facility], [place] [address] [place], [place] [address]   Ethanol     Status: None   Collection Time: 02/01/22  4:30 PM  Result Value Ref Range   Alcohol, Ethyl (B) <10 <10 mg/dL    Comment: (NOTE) Lowest detectable limit for serum alcohol is 10 mg/dL.  For medical purposes only. Performed at [facility], [place] [address] [place], [place] [address]   Lactic acid, plasma     Status: None   Collection Time: 02/01/22  6:31 PM  Result Value Ref Range   Lactic Acid, Venous 1.8 0.5 - 1.9 mmol/L    Comment: Performed at [facility] [address], [place], [place] [address]  Urinalysis, Routine w reflex microscopic Urine, In & Out Cath     Status: Abnormal   Collection Time: 02/01/22 10:42 PM  Result Value Ref Range   Color, Urine YELLOW YELLOW   APPearance HAZY (A) CLEAR   Specific Gravity, Urine >1.046 (H) 1.005 - 1.030   pH 7.0 5.0 - 8.0   Glucose, UA NEGATIVE NEGATIVE mg/dL   Hgb urine dipstick SMALL (A) NEGATIVE   Bilirubin Urine NEGATIVE NEGATIVE   Ketones, ur NEGATIVE NEGATIVE mg/dL   Protein, ur 30 (A) NEGATIVE mg/dL   Nitrite NEGATIVE NEGATIVE   Leukocytes,Ua LARGE (A)  NEGATIVE   RBC / HPF 11-20 0 - 5 RBC/hpf   WBC, UA >50 (H) 0 - 5 WBC/hpf   Bacteria, UA RARE (A) NONE SEEN   Squamous Epithelial / LPF 0-5 0 - 5    Comment: Performed at [facility], [place] [address], [place],  [address]  Rapid urine drug screen (hospital performed)     Status: None   Collection Time: 02/01/22 10:43 PM  Result Value Ref Range   Opiates NONE DETECTED NONE DETECTED   Cocaine NONE DETECTED NONE DETECTED   Benzodiazepines NONE DETECTED NONE DETECTED   Amphetamines NONE DETECTED NONE DETECTED   Tetrahydrocannabinol NONE DETECTED NONE DETECTED   Barbiturates NONE DETECTED NONE DETECTED    Comment: (NOTE) DRUG SCREEN FOR MEDICAL PURPOSES ONLY.  IF CONFIRMATION IS NEEDED FOR ANY PURPOSE, NOTIFY LAB WITHIN 5 DAYS.  LOWEST DETECTABLE LIMITS FOR URINE DRUG SCREEN Drug Class                     Cutoff (ng/mL) Amphetamine and metabolites    1000 Barbiturate and metabolites    200 Benzodiazepine                 433 Tricyclics and metabolites     300 Opiates and metabolites        300 Cocaine and metabolites        300 THC  50 Performed at [facility], [place] [address], [place], [place] [address]   Troponin I (High Sensitivity)     Status: Abnormal   Collection Time: 02/01/22 10:46 PM  Result Value Ref Range   Troponin I (High Sensitivity) 30 (H) <18 ng/L    Comment: (NOTE) Elevated high sensitivity troponin I (hsTnI) values and significant  changes across serial measurements may suggest ACS but many other  chronic and acute conditions are known to elevate hsTnI results.  Refer to the "Links" section for chest pain algorithms and additional  guidance. Performed at [facility], [place] [address], [place], [place] [address]   Troponin I (High Sensitivity)     Status: Abnormal   Collection Time: 02/02/22 12:25 AM  Result Value Ref Range   Troponin I (High Sensitivity) 29 (H) <18 ng/L    Comment: (NOTE) Elevated high  sensitivity troponin I (hsTnI) values and significant  changes across serial measurements may suggest ACS but many other  chronic and acute conditions are known to elevate hsTnI results.  Refer to the "Links" section for chest pain algorithms and additional  guidance. Performed at [facility], [place] [address], [place], [place] [address]   Hemoglobin A1c     Status: Abnormal   Collection Time: 02/02/22 12:25 AM  Result Value Ref Range   Hgb A1c MFr Bld 6.4 (H) 4.8 - 5.6 %    Comment: (NOTE) Pre diabetes:          5.7%-6.4%  Diabetes:              >6.4%  Glycemic control for   <7.0% adults with diabetes    Mean Plasma Glucose 136.98 mg/dL    Comment: Performed at [facility] [address], [place], [place] [address]  CBG monitoring, ED     Status: None   Collection Time: 02/02/22  3:50 AM  Result Value Ref Range   Glucose-Capillary 97 70 - 99 mg/dL    Comment: Glucose reference range applies only to samples taken after fasting for at least 8 hours.  Protime-INR     Status: Abnormal   Collection Time: 02/02/22  3:52 AM  Result Value Ref Range   Prothrombin Time 30.1 (H) 11.4 - 15.2 seconds   INR 2.9 (H) 0.8 - 1.2    Comment: (NOTE) INR goal varies based on device and disease states. Performed at [facility], [place] [address], [place], [place] [address]   HIV Antibody (routine testing w rflx)     Status: None   Collection Time: 02/02/22  3:52 AM  Result Value Ref Range   HIV Screen 4th Generation wRfx Non Reactive Non Reactive    Comment: Performed at [facility], [place] [address], [place], [place] [address]  CBG monitoring, ED     Status: None   Collection Time: 02/02/22 10:47 AM  Result Value Ref Range   Glucose-Capillary 79 70 - 99 mg/dL    Comment: Glucose reference range applies only to samples taken after fasting for at least 8 hours.  CBG monitoring, ED     Status: None   Collection Time: 02/02/22 11:47 AM  Result Value Ref Range    Glucose-Capillary 87 70 - 99 mg/dL    Comment: Glucose reference range applies only to samples taken after fasting for at least 8 hours.   ECHOCARDIOGRAM COMPLETE  Result Date: 02/02/2022    ECHOCARDIOGRAM REPORT   Patient Name:  Lenon Curt Date of Exam: 02/02/2022 Medical Rec #:  [id number]          Height:       66.0 in Accession #:    [id number]         Weight:       272.0 lb Date of Birth:  1960-10-03          BSA:          2.279 m Patient Age:    34 years           BP:           133/73 mmHg Patient Gender: F                  HR:           78 bpm. Exam Location:  Inpatient Procedure: 2D Echo, Cardiac Doppler, Color Doppler and Intracardiac            Opacification Agent Indications:    CHF - acute diastolic  History:        Patient has prior history of Echocardiogram examinations, most                 recent 03/13/2017. Risk Factors:Hypertension and Diabetes.  Sonographer:    Eartha Inch Referring Phys: 90 MICHAEL E NORINS  Sonographer Comments: Suboptimal parasternal window, suboptimal apical window, suboptimal subcostal window and patient is obese. Image acquisition challenging due to patient body habitus. IMPRESSIONS  1. Left ventricular ejection fraction, by estimation, is 60 to 65%. The left ventricle has normal function. The left ventricle has no regional wall motion abnormalities. Left ventricular diastolic parameters are consistent with Grade I diastolic dysfunction (impaired relaxation).  2. Right ventricular systolic function is normal. The right ventricular size is normal.  3. The mitral valve is normal in structure. No evidence of mitral valve regurgitation. No evidence of mitral stenosis.  4. The aortic valve has an indeterminant number of cusps. Aortic valve regurgitation is not visualized. No aortic stenosis is present.  5. The inferior vena cava is normal in size with greater than 50% respiratory variability, suggesting right atrial pressure of 3 mmHg. FINDINGS  Left Ventricle:  Left ventricular ejection fraction, by estimation, is 60 to 65%. The left ventricle has normal function. The left ventricle has no regional wall motion abnormalities. Definity contrast agent was given IV to delineate the left ventricular  endocardial borders. The left ventricular internal cavity size was normal in size. There is no left ventricular hypertrophy. Left ventricular diastolic parameters are consistent with Grade I diastolic dysfunction (impaired relaxation). Normal left ventricular filling pressure. Right Ventricle: The right ventricular size is normal. Right vetricular wall thickness was not well visualized. Right ventricular systolic function is normal. Left Atrium: Left atrial size was normal in size. Right Atrium: Right atrial size was normal in size. Pericardium: There is no evidence of pericardial effusion. Mitral Valve: The mitral valve is normal in structure. No evidence of mitral valve regurgitation. No evidence of mitral valve stenosis. Tricuspid Valve: The tricuspid valve is normal in structure. Tricuspid valve regurgitation is not demonstrated. No evidence of tricuspid stenosis. Aortic Valve: The aortic valve has an indeterminant number of cusps. Aortic valve regurgitation is not visualized. No aortic stenosis is present. Aortic valve mean gradient measures 6.9 mmHg. Aortic valve peak gradient measures 16.8 mmHg. Aortic valve area, by VTI measures 1.93 cm. Pulmonic Valve: The pulmonic valve was not well visualized. Pulmonic valve regurgitation is not visualized. No evidence  of pulmonic stenosis. Aorta: The aortic root is normal in size and structure. Venous: The inferior vena cava is normal in size with greater than 50% respiratory variability, suggesting right atrial pressure of 3 mmHg. IAS/Shunts: No atrial level shunt detected by color flow Doppler.  LEFT VENTRICLE PLAX 2D LVIDd:         4.40 cm     Diastology LVIDs:         3.30 cm     LV e' medial:    8.92 cm/s LV PW:         1.00 cm      LV E/e' medial:  9.6 LV IVS:        0.90 cm     LV e' lateral:   10.70 cm/s LVOT diam:     2.00 cm     LV E/e' lateral: 8.0 LV SV:         81 LV SV Index:   36 LVOT Area:     3.14 cm  LV Volumes (MOD) LV vol d, MOD A2C: 51.7 ml LV vol d, MOD A4C: 46.4 ml LV vol s, MOD A2C: 24.0 ml LV vol s, MOD A4C: 15.6 ml LV SV MOD A2C:     27.7 ml LV SV MOD A4C:     46.4 ml LV SV MOD BP:      32.8 ml RIGHT VENTRICLE             IVC RV S prime:     12.50 cm/s  IVC diam: 1.80 cm TAPSE (M-mode): 1.8 cm LEFT ATRIUM             Index LA diam:        3.50 cm 1.54 cm/m LA Vol (A2C):   33.7 ml 14.79 ml/m LA Vol (A4C):   48.4 ml 21.24 ml/m LA Biplane Vol: 41.8 ml 18.34 ml/m  AORTIC VALVE AV Area (Vmax):    1.84 cm AV Area (Vmean):   2.36 cm AV Area (VTI):     1.93 cm AV Vmax:           205.11 cm/s AV Vmean:          118.622 cm/s AV VTI:            0.422 m AV Peak Grad:      16.8 mmHg AV Mean Grad:      6.9 mmHg LVOT Vmax:         120.00 cm/s LVOT Vmean:        89.100 cm/s LVOT VTI:          0.259 m LVOT/AV VTI ratio: 0.61  AORTA Ao Root diam: 3.10 cm MITRAL VALVE MV Area (PHT): 2.69 cm     SHUNTS MV Decel Time: 282 msec     Systemic VTI:  0.26 m MV E velocity: 85.60 cm/s   Systemic Diam: 2.00 cm MV A velocity: 107.00 cm/s MV E/A ratio:  0.80 Carlyle Dolly MD Electronically signed by Carlyle Dolly MD Signature Date/Time: 02/02/2022/1:00:37 PM    Final    CT ABDOMEN PELVIS W CONTRAST  Result Date: 02/01/2022 CLINICAL DATA:  Generalized weakness. EXAM: CT ABDOMEN AND PELVIS WITH CONTRAST TECHNIQUE: Multidetector CT imaging of the abdomen and pelvis was performed using the standard protocol following bolus administration of intravenous contrast. RADIATION DOSE REDUCTION: This exam was performed according to the departmental dose-optimization program which includes automated exposure control, adjustment of the mA and/or kV according to patient size and/or use of iterative  reconstruction technique. CONTRAST:  130m OMNIPAQUE  IOHEXOL 350 MG/ML SOLN COMPARISON:  None Available. FINDINGS: Lower chest: Mild atelectasis is seen within the bilateral lung bases. Hepatobiliary: There is diffuse fatty infiltration of the liver parenchyma. No focal liver abnormality is seen. Ill-defined gallstones are seen within the gallbladder lumen, without evidence of gallbladder wall thickening or biliary dilatation. Pancreas: Diffuse fatty infiltration of the pancreatic parenchyma is seen. No pancreatic ductal dilatation or surrounding inflammatory changes. Spleen: Normal in size without focal abnormality. Adrenals/Urinary Tract: The bilateral adrenal glands are unremarkable. A 13.1 cm by 9.0 cm x 12.2 cm simple cyst is seen within the posterolateral aspect of the right kidney. A 4.8 cm x 3.4 cm x 4.1 cm exophytic, heterogeneous mildly hyperdense lesion (approximately 82.46 Hounsfield units) is seen within the anterior aspect of the mid to upper right kidney. A 1.8 cm x 1.4 cm x 2.1 cm cystic appearing area is seen adjacent to the posterolateral aspect of the mid left kidney (approximately 49.00 Hounsfield units). Bilateral 2 mm, 3 mm and 4 mm nonobstructing renal calculi are seen. There is no evidence of hydronephrosis or hydroureter. The urinary bladder is poorly distended and subsequently limited in evaluation. Stomach/Bowel: Stomach is within normal limits. Appendix appears normal. No evidence of bowel wall thickening, distention, or inflammatory changes. Vascular/Lymphatic: No significant vascular findings are present. Multiple tiny para-aortic and aortocaval lymph nodes are noted. A 2.2 cm x 1.5 cm x 1.9 cm posterior left iliac chain lymph node is seen. Several adjacent tiny pelvic lymph nodes are also noted. Reproductive: Uterus and bilateral adnexa are unremarkable. Other: There is a small fat containing umbilical hernia. No abdominopelvic ascites. Musculoskeletal: No acute or significant osseous findings. IMPRESSION: 1. 4.8 cm x 3.4 cm x 4.1 cm  exophytic lesion within the anterior aspect of the mid to upper right kidney which may represent a hemorrhagic cyst. Correlation with MRI (without and with IV contrast) is recommended to exclude the presence of an underlying neoplastic process. 2. Very large simple right renal cyst with a much smaller exophytic cystic-appearing area seen along the posteromedial aspect of the mid left kidney. 3. Bilateral nonobstructing renal calculi. 4. Cholelithiasis. 5. Enlarged posterior left iliac chain lymph node which may be reactive in nature. Electronically Signed   By: TVirgina NorfolkM.D.   On: 02/01/2022 20:36   CT Angio Chest PE W and/or Wo Contrast  Result Date: 02/01/2022 CLINICAL DATA:  Generalized weakness and history of recurrent DVT. EXAM: CT ANGIOGRAPHY CHEST WITH CONTRAST TECHNIQUE: Multidetector CT imaging of the chest was performed using the standard protocol during bolus administration of intravenous contrast. Multiplanar CT image reconstructions and MIPs were obtained to evaluate the vascular anatomy. RADIATION DOSE REDUCTION: This exam was performed according to the departmental dose-optimization program which includes automated exposure control, adjustment of the mA and/or kV according to patient size and/or use of iterative reconstruction technique. CONTRAST:  1071mOMNIPAQUE IOHEXOL 350 MG/ML SOLN COMPARISON:  None Available. FINDINGS: Cardiovascular: There is mild calcification of the aortic arch, without evidence of aortic aneurysm. The segmental and subsegmental pulmonary arteries are limited in evaluation secondary to suboptimal opacification with intravenous contrast and motion artifact. No evidence of pulmonary embolism. Prominence of the perihilar pulmonary vasculature is noted. There is mild cardiomegaly. No pericardial effusion. Mediastinum/Nodes: No enlarged mediastinal, hilar, or axillary lymph nodes. Thyroid gland, trachea, and esophagus demonstrate no significant findings. Lungs/Pleura:  Low lung volumes are seen which may be, in part, secondary to the degree of patient inspiration.  Mild atelectatic changes are seen within the bilateral lung bases. There is no evidence of acute infiltrate, pleural effusion or pneumothorax. Upper Abdomen: No acute abnormality. Musculoskeletal: Degenerative changes are seen involving both shoulders, with multilevel degenerative changes noted throughout the thoracic spine. Review of the MIP images confirms the above findings. IMPRESSION: 1. Limited evaluation of the segmental and subsegmental pulmonary arteries, as described above, without definite evidence of pulmonary embolism. 2. Mild cardiomegaly with mild to moderate severity pulmonary vascular congestion. 3. Mild bibasilar atelectasis. 4. Aortic atherosclerosis. Aortic Atherosclerosis (ICD10-I70.0). Electronically Signed   By: Virgina Norfolk M.D.   On: 02/01/2022 20:18   DG Chest Port 1 View  Result Date: 02/01/2022 CLINICAL DATA:  Possible sepsis. EXAM: PORTABLE CHEST 1 VIEW COMPARISON:  Radiograph 12/28/2019 FINDINGS: Low lung volumes with moderate patient rotation. Suspected cardiomegaly. Patchy left lung base opacity. No pulmonary edema. No large pleural effusion or visualized pneumothorax. On limited assessment, no acute osseous findings. IMPRESSION: Low lung volumes with moderate patient rotation. Patchy left lung base opacity may be atelectasis or pneumonia. Electronically Signed   By: Keith Rake M.D.   On: 02/01/2022 17:19   CT Head Wo Contrast  Result Date: 02/01/2022 CLINICAL DATA:  Altered mental status EXAM: CT HEAD WITHOUT CONTRAST TECHNIQUE: Contiguous axial images were obtained from the base of the skull through the vertex without intravenous contrast. RADIATION DOSE REDUCTION: This exam was performed according to the departmental dose-optimization program which includes automated exposure control, adjustment of the mA and/or kV according to patient size and/or use of iterative  reconstruction technique. COMPARISON:  None Available. FINDINGS: Brain: No acute intracranial findings are seen in noncontrast CT brain. There are no signs of bleeding within the cranium. Ventricles are nondilated. Cortical sulci are prominent. Vascular: Scattered arterial calcifications are seen. Skull: Unremarkable. Sinuses/Orbits: Unremarkable. Other: None. IMPRESSION: No acute intracranial findings are seen in noncontrast CT brain. Atrophy. Electronically Signed   By: Elmer Picker M.D.   On: 02/01/2022 16:58    Pending Labs Unresulted Labs (From admission, onward)     Start     Ordered   02/02/22 1152  CBC  ONCE - URGENT,   URGENT        02/02/22 1155   02/02/22 6063  Basic metabolic panel  ONCE - URGENT,   URGENT        02/02/22 1155   02/02/22 1152  Brain natriuretic peptide  ONCE - URGENT,   URGENT        02/02/22 1155   02/02/22 0500  Protime-INR  Daily at 5am,   R      02/02/22 0212   02/01/22 1621  APTT  (Undifferentiated presentation (screening labs and basic nursing orders))  ONCE - STAT,   STAT        02/01/22 1622   02/01/22 1621  Urine Culture  (Undifferentiated presentation (screening labs and basic nursing orders))  ONCE - URGENT,   URGENT       Question:  Indication  Answer:  Sepsis   02/01/22 1622            Vitals/Pain Today's Vitals   02/02/22 1345 02/02/22 1400 02/02/22 1415 02/02/22 1430  BP: 121/66 133/72 (!) 141/77 124/77  Pulse: 78 79 84 80  Resp: 20 (!) 23 17 (!) 24  Temp:      TempSrc:      SpO2: 94% 98% 97% 96%  Weight:      Height:      PainSc:  Isolation Precautions No active isolations  Medications Medications  vancomycin (VANCOREADY) IVPB 750 mg/150 mL (750 mg Intravenous New Bag/Given 02/02/22 1249)  acetaminophen (TYLENOL) tablet 650 mg (has no administration in time range)  LORazepam (ATIVAN) tablet 0.5 mg (has no administration in time range)  folic acid (FOLVITE) tablet 2 mg (2 mg Oral Given 02/02/22 1040)  gabapentin  (NEURONTIN) capsule 400 mg (400 mg Oral Given 02/02/22 0132)  gabapentin (NEURONTIN) capsule 300 mg (300 mg Oral Given 02/02/22 1040)  topiramate (TOPAMAX) tablet 25 mg (25 mg Oral Given 02/02/22 1041)  predniSONE (DELTASONE) tablet 5 mg (5 mg Oral Given 02/02/22 1040)  nystatin (MYCOSTATIN/NYSTOP) topical powder ( Topical Patient Refused/Not Given 02/02/22 1100)  cefTRIAXone (ROCEPHIN) 1 g in sodium chloride 0.9 % 100 mL IVPB (has no administration in time range)  insulin glargine-yfgn (SEMGLEE) injection 10 Units (10 Units Subcutaneous Given 02/02/22 0355)  insulin aspart (novoLOG) injection 0-20 Units ( Subcutaneous Not Given 02/02/22 1156)  warfarin (COUMADIN) tablet 2.5 mg (has no administration in time range)  warfarin (COUMADIN) tablet 5 mg (has no administration in time range)  Warfarin - Pharmacist Dosing Inpatient (has no administration in time range)  perflutren lipid microspheres (DEFINITY) IV suspension (2 mLs Intravenous Given 02/02/22 0914)  furosemide (LASIX) injection 20 mg (20 mg Intravenous Given 02/02/22 1207)  diphenhydrAMINE (BENADRYL) injection 12.5 mg (12.5 mg Intravenous Given 02/02/22 1245)  lactated ringers bolus 1,000 mL (0 mLs Intravenous Stopped 02/01/22 1842)  cefTRIAXone (ROCEPHIN) 2 g in sodium chloride 0.9 % 100 mL IVPB (0 g Intravenous Stopped 02/01/22 1843)  lactated ringers bolus 1,000 mL (0 mLs Intravenous Stopped 02/01/22 2109)  acetaminophen (TYLENOL) tablet 1,000 mg (1,000 mg Oral Given 02/01/22 1911)  iohexol (OMNIPAQUE) 350 MG/ML injection 100 mL (100 mLs Intravenous Contrast Given 02/01/22 1954)  vancomycin (VANCOREADY) IVPB 1750 mg/350 mL (0 mg Intravenous Stopped 02/02/22 0054)  diphenhydrAMINE (BENADRYL) injection 25 mg (25 mg Intravenous Given 02/01/22 2240)    Mobility walks with device Low fall risk     R Recommendations: See Admitting Provider Note

## 2022-02-02 NOTE — H&P (Signed)
History and Physical    Brittany Tran QBH:[id number] DOB: May 05, 1961 DOA: 02/01/2022  DOS: the patient was seen and examined on 02/01/2022  PCP: Curt Jews, PA-C   Patient coming from: Home  I have personally briefly reviewed patient's old medical records in [facility] Link  [name], a 61 y/o follow for DM2, morbid obesity, chronic venous stasis ulceration, worse LLE, was found by neighbors slumped in her chair and minimally responsive. EMS activated and patient transported to [facility] for evaluation with a concern for UTI and or leg infection and sepsis. She has a prior h/o  sepsis related to cellulitis   ED Course: Febrile to 103.3 HR 125  BP 126/62. Patient was very confused and and disoriented. She was found to have a UTI but also had a red angry left LE. Code Sepsis initiated and patient received 2L LR, IV Rocephin. For cellulitis she was dose with vancomycin.Lactic acid #1 2.4, # 2 1.8. WBC 29.5 with 80/13/6. TRH called to admit for continued evaluation and treatment.  Review of Systems:  Review of Systems  Constitutional:  Positive for chills and fever. Negative for malaise/fatigue and weight loss.  HENT: Negative.    Eyes: Negative.   Respiratory:  Positive for shortness of breath. Negative for cough and wheezing.   Cardiovascular:  Positive for leg swelling. Negative for chest pain and palpitations.  Gastrointestinal: Negative.   Genitourinary:  Positive for dysuria and frequency.  Musculoskeletal:  Positive for myalgias.  Skin:        Open ulcer distal Left leg and redness to above the knee that is tender  Neurological: Negative.   Endo/Heme/Allergies: Negative.   Psychiatric/Behavioral: Negative.      Past Medical History:  Diagnosis Date   Cellulitis    Diabetes mellitus without complication (HCC)    Diastolic heart failure (HCC)    Echo 03/14/15 Grade I DD   Fibromyalgia    History of DVT (deep vein thrombosis)    Hypertension    Rheumatoid arthritis ([place])     Sepsis ([place]) 12/2019    Past Surgical History:  Procedure Laterality Date   HARDWARE REMOVAL Left 09/29/2014   Procedure: REMOVAL OF DEEP IMPLANT OF LEFT FIBULA  AND TIBIA (SEPARATE INCISIONS);  Surgeon: Wylene Simmer, MD;  Location: [place];  Service: Orthopedics;  Laterality: Left;   ORIF ANKLE FRACTURE  1988   left    Soc Hx - unmarried, no children. Lives alone. Work - Electrical engineer.   reports that she has never smoked. She has never used smokeless tobacco. She reports that she does not drink alcohol and does not use drugs.  Allergies  Allergen Reactions   Penicillins Hives    Has patient had a PCN reaction causing immediate rash, facial/tongue/throat swelling, SOB or lightheadedness with hypotension: Yes Has patient had a PCN reaction causing severe rash involving mucus membranes or skin necrosis: No Has patient had a PCN reaction that required hospitalization: No Has patient had a PCN reaction occurring within the last 10 years: No If all of the above answers are "NO", then may proceed with Cephalosporin use.    Vancomycin Rash    Developed a red rash bilaterally on her arms, legs, neck, chest and abdomen   Penicillin G Benzathine     Other reaction(s): rash   Sulfa Antibiotics Hives and Rash    Other reaction(s): Other (See Comments) Other reaction(s): Unknown    Family History  Problem Relation Age of Onset  Diabetes Mother     Prior to Admission medications   Medication Sig Start Date End Date Taking? Authorizing Provider  acetaminophen (TYLENOL) 325 MG tablet Take 2 tablets (650 mg total) by mouth every 6 (six) hours as needed for mild pain or fever. 05/21/18   Roxan Hockey, MD  Blood Glucose Monitoring Suppl (TRUE METRIX AIR GLUCOSE METER) DEVI 1 strip by Does not apply route 3 (three) times daily before meals. 06/22/18   Elsie Stain, MD  Calcium Carb-Cholecalciferol (OYSTER SHELL CALCIUM/VITAMIN D) 500-5 MG-MCG PACK 1  tablet with a meal    [provider]  Calcium-Magnesium-Vitamin D 500-250-200 MG-MG-UNIT TABS 1 tablet with a meal    [provider]  folic acid (FOLVITE) 1 MG tablet Take 2 mg by mouth daily. 09/27/19   [provider]  gabapentin (NEURONTIN) 100 MG capsule Take 400 mg by mouth at bedtime.  05/05/18   [provider]  gabapentin (NEURONTIN) 300 MG capsule Take 1 capsule by mouth 3 (three) times daily.    [provider]  glucosamine-chondroitin 500-400 MG tablet Take 1 tablet by mouth 2 (two) times daily.    [provider]  glucose blood (TRUE METRIX BLOOD GLUCOSE TEST) test strip Use as instructed 06/22/18   Elsie Stain, MD  LORazepam (ATIVAN) 0.5 MG tablet 1 tablet at bedtime as needed 11/30/20   [provider]  methotrexate (RHEUMATREX) 2.5 MG tablet Take 25 mg by mouth once a week.  10/28/19   [provider]  Multiple Vitamin (MULTIVITAMIN ADULT PO)     [provider]  oxyCODONE-acetaminophen (PERCOCET/ROXICET) 5-325 MG tablet Take 1-2 tablets by mouth every 6 (six) hours as needed for moderate pain.  03/26/17   [provider]  predniSONE (DELTASONE) 1 MG tablet Take 1 mg by mouth daily with breakfast.     [provider]  topiramate (TOPAMAX) 25 MG tablet Take 1 tablet (25 mg total) by mouth 2 (two) times daily. 12/04/21   Raulkar, Clide Deutscher, MD  TRUEPLUS LANCETS 28G MISC Use to check blood sugar daily. 06/22/18   Elsie Stain, MD  Vitamin D, Ergocalciferol, (DRISDOL) 1.25 MG (50000 UNIT) CAPS capsule Take 1 capsule (50,000 Units total) by mouth every 7 (seven) days. 12/26/21   Izora Ribas, MD  warfarin (COUMADIN) 5 MG tablet 1 tablet 09/19/21   [provider]    Physical Exam: Vitals:   02/01/22 2330 02/02/22 0030 02/02/22 0045 02/02/22 0130  BP: 126/62 136/73 136/74 137/74  Pulse: 83 84 87 86  Resp: (!) 24 (!) 21 (!) 21 (!) 24  Temp:      TempSrc:      SpO2:  100% 100% 96% 95%  Weight:      Height:        Physical Exam Vitals and nursing note reviewed.  Constitutional:      General: She is not in acute distress.    Appearance: She is obese. She is ill-appearing. She is not toxic-appearing.     Comments: Awake but a little somnolent.  HENT:     Head: Normocephalic and atraumatic.     Mouth/Throat:     Mouth: Mucous membranes are dry.     Comments: Native dentition Eyes:     Extraocular Movements: Extraocular movements intact.     Conjunctiva/sclera: Conjunctivae normal.     Pupils: Pupils are equal, round, and reactive to light.  Cardiovascular:     Rate and Rhythm: Normal rate and regular  rhythm.     Heart sounds: No murmur heard.    Comments: Trace DP pulse, 2+ radial pulse Pulmonary:     Effort: Pulmonary effort is normal.     Breath sounds: No wheezing, rhonchi or rales.  Abdominal:     General: Bowel sounds are normal.     Palpations: Abdomen is soft.     Comments: Exam hindered by obesity  Musculoskeletal:        General: No deformity or signs of injury. Normal range of motion.     Cervical back: Normal range of motion and neck supple.     Comments: Bilateral LE edema L>R  Skin:    Comments: Chronic venous stasis changes both legs. Left leg with marked Calore, rubor, dolore with erythema extended above the knee to medial thigh. Small stasis ulcer dorsum left foot at ankle. Girth hinder exam for enlarge nodes groin.  Under her panniculus erythematous rash with early skin breakdown right side worse than left   Neurological:     Mental Status: She is oriented to person, place, and time.     Cranial Nerves: No cranial nerve deficit.  Psychiatric:        Mood and Affect: Mood normal.        Behavior: Behavior normal.      Labs on Admission: I have personally reviewed following labs and imaging studies  CBC: Recent Labs  Lab 02/01/22 1612  WBC 29.5*  HGB 13.1  HCT 39.5  MCV 99.7  PLT 614   Basic Metabolic  Panel: Recent Labs  Lab 02/01/22 1612  NA 141  K 3.8  CL 109  CO2 25  GLUCOSE 138*  BUN 14  CREATININE 1.06*  CALCIUM 8.4*   GFR: Estimated Creatinine Clearance: 73.8 mL/min (A) (by C-G formula based on SCr of 1.06 mg/dL (H)). Liver Function Tests: Recent Labs  Lab 02/01/22 1612  AST 27  ALT 11  ALKPHOS 104  BILITOT 1.1  PROT 7.2  ALBUMIN 2.9*   No results for input(s): "LIPASE", "AMYLASE" in the last 168 hours. No results for input(s): "AMMONIA" in the last 168 hours. Coagulation Profile: Recent Labs  Lab 02/01/22 1612  INR 2.9*   Cardiac Enzymes: No results for input(s): "CKTOTAL", "CKMB", "CKMBINDEX", "TROPONINI" in the last 168 hours. BNP (last 3 results) No results for input(s): "PROBNP" in the last 8760 hours. HbA1C: No results for input(s): "HGBA1C" in the last 72 hours. CBG: No results for input(s): "GLUCAP" in the last 168 hours. Lipid Profile: No results for input(s): "CHOL", "HDL", "LDLCALC", "TRIG", "CHOLHDL", "LDLDIRECT" in the last 72 hours. Thyroid Function Tests: No results for input(s): "TSH", "T4TOTAL", "FREET4", "T3FREE", "THYROIDAB" in the last 72 hours. Anemia Panel: No results for input(s): "VITAMINB12", "FOLATE", "FERRITIN", "TIBC", "IRON", "RETICCTPCT" in the last 72 hours. Urine analysis:    Component Value Date/Time   COLORURINE YELLOW 02/01/2022 2242   APPEARANCEUR HAZY (A) 02/01/2022 2242   LABSPEC >1.046 (H) 02/01/2022 2242   PHURINE 7.0 02/01/2022 2242   GLUCOSEU NEGATIVE 02/01/2022 2242   HGBUR SMALL (A) 02/01/2022 2242   [place] NEGATIVE 02/01/2022 2242   [place] 02/01/2022 2242   PROTEINUR 30 (A) 02/01/2022 2242   NITRITE NEGATIVE 02/01/2022 2242   LEUKOCYTESUR LARGE (A) 02/01/2022 2242    Radiological Exams on Admission: I have personally reviewed images CT ABDOMEN PELVIS W CONTRAST  Result Date: 02/01/2022 CLINICAL DATA:  Generalized weakness. EXAM: CT ABDOMEN AND PELVIS WITH CONTRAST TECHNIQUE:  Multidetector CT imaging of the abdomen  and pelvis was performed using the standard protocol following bolus administration of intravenous contrast. RADIATION DOSE REDUCTION: This exam was performed according to the departmental dose-optimization program which includes automated exposure control, adjustment of the mA and/or kV according to patient size and/or use of iterative reconstruction technique. CONTRAST:  170m OMNIPAQUE IOHEXOL 350 MG/ML SOLN COMPARISON:  None Available. FINDINGS: Lower chest: Mild atelectasis is seen within the bilateral lung bases. Hepatobiliary: There is diffuse fatty infiltration of the liver parenchyma. No focal liver abnormality is seen. Ill-defined gallstones are seen within the gallbladder lumen, without evidence of gallbladder wall thickening or biliary dilatation. Pancreas: Diffuse fatty infiltration of the pancreatic parenchyma is seen. No pancreatic ductal dilatation or surrounding inflammatory changes. Spleen: Normal in size without focal abnormality. Adrenals/Urinary Tract: The bilateral adrenal glands are unremarkable. A 13.1 cm by 9.0 cm x 12.2 cm simple cyst is seen within the posterolateral aspect of the right kidney. A 4.8 cm x 3.4 cm x 4.1 cm exophytic, heterogeneous mildly hyperdense lesion (approximately 82.46 Hounsfield units) is seen within the anterior aspect of the mid to upper right kidney. A 1.8 cm x 1.4 cm x 2.1 cm cystic appearing area is seen adjacent to the posterolateral aspect of the mid left kidney (approximately 49.00 Hounsfield units). Bilateral 2 mm, 3 mm and 4 mm nonobstructing renal calculi are seen. There is no evidence of hydronephrosis or hydroureter. The urinary bladder is poorly distended and subsequently limited in evaluation. Stomach/Bowel: Stomach is within normal limits. Appendix appears normal. No evidence of bowel wall thickening, distention, or inflammatory changes. Vascular/Lymphatic: No significant vascular findings are present. Multiple  tiny para-aortic and aortocaval lymph nodes are noted. A 2.2 cm x 1.5 cm x 1.9 cm posterior left iliac chain lymph node is seen. Several adjacent tiny pelvic lymph nodes are also noted. Reproductive: Uterus and bilateral adnexa are unremarkable. Other: There is a small fat containing umbilical hernia. No abdominopelvic ascites. Musculoskeletal: No acute or significant osseous findings. IMPRESSION: 1. 4.8 cm x 3.4 cm x 4.1 cm exophytic lesion within the anterior aspect of the mid to upper right kidney which may represent a hemorrhagic cyst. Correlation with MRI (without and with IV contrast) is recommended to exclude the presence of an underlying neoplastic process. 2. Very large simple right renal cyst with a much smaller exophytic cystic-appearing area seen along the posteromedial aspect of the mid left kidney. 3. Bilateral nonobstructing renal calculi. 4. Cholelithiasis. 5. Enlarged posterior left iliac chain lymph node which may be reactive in nature. Electronically Signed   By: TVirgina NorfolkM.D.   On: 02/01/2022 20:36   CT Angio Chest PE W and/or Wo Contrast  Result Date: 02/01/2022 CLINICAL DATA:  Generalized weakness and history of recurrent DVT. EXAM: CT ANGIOGRAPHY CHEST WITH CONTRAST TECHNIQUE: Multidetector CT imaging of the chest was performed using the standard protocol during bolus administration of intravenous contrast. Multiplanar CT image reconstructions and MIPs were obtained to evaluate the vascular anatomy. RADIATION DOSE REDUCTION: This exam was performed according to the departmental dose-optimization program which includes automated exposure control, adjustment of the mA and/or kV according to patient size and/or use of iterative reconstruction technique. CONTRAST:  1095mOMNIPAQUE IOHEXOL 350 MG/ML SOLN COMPARISON:  None Available. FINDINGS: Cardiovascular: There is mild calcification of the aortic arch, without evidence of aortic aneurysm. The segmental and subsegmental pulmonary  arteries are limited in evaluation secondary to suboptimal opacification with intravenous contrast and motion artifact. No evidence of pulmonary embolism. Prominence of the perihilar pulmonary vasculature  is noted. There is mild cardiomegaly. No pericardial effusion. Mediastinum/Nodes: No enlarged mediastinal, hilar, or axillary lymph nodes. Thyroid gland, trachea, and esophagus demonstrate no significant findings. Lungs/Pleura: Low lung volumes are seen which may be, in part, secondary to the degree of patient inspiration. Mild atelectatic changes are seen within the bilateral lung bases. There is no evidence of acute infiltrate, pleural effusion or pneumothorax. Upper Abdomen: No acute abnormality. Musculoskeletal: Degenerative changes are seen involving both shoulders, with multilevel degenerative changes noted throughout the thoracic spine. Review of the MIP images confirms the above findings. IMPRESSION: 1. Limited evaluation of the segmental and subsegmental pulmonary arteries, as described above, without definite evidence of pulmonary embolism. 2. Mild cardiomegaly with mild to moderate severity pulmonary vascular congestion. 3. Mild bibasilar atelectasis. 4. Aortic atherosclerosis. Aortic Atherosclerosis (ICD10-I70.0). Electronically Signed   By: Virgina Norfolk M.D.   On: 02/01/2022 20:18   DG Chest Port 1 View  Result Date: 02/01/2022 CLINICAL DATA:  Possible sepsis. EXAM: PORTABLE CHEST 1 VIEW COMPARISON:  Radiograph 12/28/2019 FINDINGS: Low lung volumes with moderate patient rotation. Suspected cardiomegaly. Patchy left lung base opacity. No pulmonary edema. No large pleural effusion or visualized pneumothorax. On limited assessment, no acute osseous findings. IMPRESSION: Low lung volumes with moderate patient rotation. Patchy left lung base opacity may be atelectasis or pneumonia. Electronically Signed   By: Keith Rake M.D.   On: 02/01/2022 17:19   CT Head Wo Contrast  Result Date:  02/01/2022 CLINICAL DATA:  Altered mental status EXAM: CT HEAD WITHOUT CONTRAST TECHNIQUE: Contiguous axial images were obtained from the base of the skull through the vertex without intravenous contrast. RADIATION DOSE REDUCTION: This exam was performed according to the departmental dose-optimization program which includes automated exposure control, adjustment of the mA and/or kV according to patient size and/or use of iterative reconstruction technique. COMPARISON:  None Available. FINDINGS: Brain: No acute intracranial findings are seen in noncontrast CT brain. There are no signs of bleeding within the cranium. Ventricles are nondilated. Cortical sulci are prominent. Vascular: Scattered arterial calcifications are seen. Skull: Unremarkable. Sinuses/Orbits: Unremarkable. Other: None. IMPRESSION: No acute intracranial findings are seen in noncontrast CT brain. Atrophy. Electronically Signed   By: Elmer Picker M.D.   On: 02/01/2022 16:58    EKG: I have personally reviewed EKG: Sinus TAchycardia  Assessment/Plan Active Problems:   Sepsis due to cellulitis (HCC)   Cellulitis of left lower extremity   Diabetes mellitus without complication (HCC)   Chronic diastolic CHF (congestive heart failure) (HCC)   Anticoagulation goal of INR 2 to 3   Rheumatoid arthritis ([place])   Hypertension   Skin yeast infection    Assessment and Plan: Cellulitis of left lower extremity Patient with h/o venous stasis ulcers chronically left LE. She has had cellulitis before with accompanying sepsis. She now has a recurrent shallow ulcer at the dorsal foot with rising erythema to above the knee with tenderness and heat. WBC 29.5 with 80/13/6.  Plan Blood cx sent to lab  Continue Vancomycin dosing per pharmacy consult  Continue Rocephin 1 g q 24  Cleansing and Dry dressing to shallow open wound BID  Elevation of leg  F/u Lab  Sepsis due to cellulitis [facility]) Patient presented with leukocytosis, fever to 103.3 HR  125, lacitic acid 2.4 and mental status changes. Code sepsis initiated: patient received 2L LR bolus, Rocephin IV, Vancomycin IV. She deferevesed to 99, HR slowed to 83, BP remained stable.  Plan Treat underlying infections  Anticoagulation goal of INR  2 to 3 Patient on warfarin for h/o recurrent DVT  Plan Pharmacy consult warfarin mgt.   Chronic diastolic CHF (congestive heart failure) ([place]) Patient does endorse SOB/DOE, worse when she is sick. She is sedentary. Last Echo 03/14/15 with Grade I DD and mild LVH. Lungs w/o rales - suboptimal exam due to girth and immobility.   Plan Complete Echo  Medication recommendations to follow: may benefit from diuretic and ARB    Diabetes mellitus without complication [facility]) Patient followed by Dr. Trudie Reed. She takes an over the counter medication which she claims controls her DM. Does not recall last A1C. In Epic last A1C 9.1% 12/30/19.  Plan A1C  Basal insulin 10 qHS in setting of active infection  SS  Low carb diet  Skin yeast infection Patient with yeast infection under panniculus.  Plan  nystatin powder to affected area BID  Hypertension BP is in normal range - may be higher but now low due to sepsis. No antihypertensive medication on med list.  Plan Close monitoring   Rheumatoid arthritis [facility]) Patient denies any joint flare. Continues to take MTX and low dose prednisone.  Plan Hold MTX  Stress dose of steroids - 5 mg prednisone/day       DVT prophylaxis: Coumadin Code Status: Full Code Family Communication: mother is contact - too late at night to contact. Disposition Plan: TBD  Consults called: none  Admission status: Inpatient, Med-Surg   Adella Hare, MD [facility] 02/02/2022, 1:35 AM

## 2022-02-02 NOTE — Progress Notes (Addendum)
NEW ADMISSION NOTE New Admission Note:   Arrival Method: stretcher Mental Orientation: A&OX4 Telemetry: 5M18 Assessment: Completed Skin: intact, surgical scar RLQ, ulcer bottom of R foot, BLE discolored, 2 L ankle IV: LAC, RAC Pain: 0/10 Tubes: none Safety Measures: Safety Fall Prevention Plan has been given, discussed and signed Admission: Completed [facility] [place] Orientation: Patient has been orientated to the room, unit and staff.  Family: in family room  Orders have been reviewed and implemented. Will continue to monitor the patient. Call light has been placed within reach and bed alarm has been activated.   Wyoma Genson S Tuana Hoheisel, RN

## 2022-02-02 NOTE — Progress Notes (Signed)
ANTICOAGULATION CONSULT NOTE - Initial Consult  Pharmacy Consult for warfarin Indication: Hx of DVT  Allergies  Allergen Reactions   Penicillins Hives    Has patient had a PCN reaction causing immediate rash, facial/tongue/throat swelling, SOB or lightheadedness with hypotension: Yes Has patient had a PCN reaction causing severe rash involving mucus membranes or skin necrosis: No Has patient had a PCN reaction that required hospitalization: No Has patient had a PCN reaction occurring within the last 10 years: No If all of the above answers are "NO", then may proceed with Cephalosporin use.    Vancomycin Rash    Developed a red rash bilaterally on her arms, legs, neck, chest and abdomen   Penicillin G Benzathine     Other reaction(s): rash   Sulfa Antibiotics Hives and Rash    Other reaction(s): Other (See Comments) Other reaction(s): Unknown    Patient Measurements: Height: '5\' 6"'$  (167.6 cm) Weight: 123.4 kg (272 lb 0.8 oz) IBW/kg (Calculated) : 59.3   Vital Signs: Temp: 99.9 F (37.7 C) (08/18 2232) Temp Source: Oral (08/18 2232) BP: 137/74 (08/19 0130) Pulse Rate: 86 (08/19 0130)  Labs: Recent Labs    02/01/22 1612 02/01/22 2246 02/02/22 0025  HGB 13.1  --   --   HCT 39.5  --   --   PLT 196  --   --   LABPROT 30.1*  --   --   INR 2.9*  --   --   CREATININE 1.06*  --   --   TROPONINIHS  --  30* 29*    Estimated Creatinine Clearance: 73.8 mL/min (A) (by C-G formula based on SCr of 1.06 mg/dL (H)).   Medical History: Past Medical History:  Diagnosis Date   Cellulitis    Diabetes mellitus without complication (HCC)    Diastolic heart failure (HCC)    Echo 03/14/15 Grade I DD   Fibromyalgia    History of DVT (deep vein thrombosis)    Hypertension    Rheumatoid arthritis ([place])    Sepsis ([place]) 12/2019     Assessment: 61 yoF minimally responsive suspected to have UTI and or leg infection. Patient is on warfarin prior to admission last dose 8/17. INR 2.9,  HgB/PLT WNL  PTA warfarin dose:  Take 1 tablet ('5mg'$ ) Monday, Wednesday, Friday and Saturday and 1/2 TABLET (2.'5mg'$ ) on Sundays, Tuesdays and Thursdays    Goal of Therapy:  INR 2-3 Monitor platelets by anticoagulation protocol: Yes   Plan:  Continue home warfarin dose Daily INR  Georga Bora, PharmD Clinical Pharmacist 02/02/2022 2:08 AM Please check AMION for all [place] numbers

## 2022-02-03 DIAGNOSIS — L03116 Cellulitis of left lower limb: Secondary | ICD-10-CM | POA: Diagnosis not present

## 2022-02-03 LAB — BASIC METABOLIC PANEL
Anion gap: 7 (ref 5–15)
BUN: 12 mg/dL (ref 8–23)
CO2: 25 mmol/L (ref 22–32)
Calcium: 7.7 mg/dL — ABNORMAL LOW (ref 8.9–10.3)
Chloride: 107 mmol/L (ref 98–111)
Creatinine, Ser: 0.76 mg/dL (ref 0.44–1.00)
GFR, Estimated: >60 mL/min (ref >60)
Glucose, Bld: 82 mg/dL (ref 70–99)
Potassium: 2.9 mmol/L — ABNORMAL LOW (ref 3.5–5.1)
Sodium: 139 mmol/L (ref 135–145)

## 2022-02-03 LAB — PROTIME-INR
INR: 2.6 — ABNORMAL HIGH (ref 0.8–1.2)
Prothrombin Time: 27.5 seconds — ABNORMAL HIGH (ref 11.4–15.2)

## 2022-02-03 LAB — CBC
HCT: 36.3 % (ref 36.0–46.0)
Hemoglobin: 12 g/dL (ref 12.0–15.0)
MCH: 32.8 pg (ref 26.0–34.0)
MCHC: 33.1 g/dL (ref 30.0–36.0)
MCV: 99.2 fL (ref 80.0–100.0)
Platelets: 146 10*3/uL — ABNORMAL LOW (ref 150–400)
RBC: 3.66 MIL/uL — ABNORMAL LOW (ref 3.87–5.11)
RDW: 15.5 % (ref 11.5–15.5)
WBC: 14 10*3/uL — ABNORMAL HIGH (ref 4.0–10.5)
nRBC: 0 % (ref 0.0–0.2)

## 2022-02-03 LAB — GLUCOSE, CAPILLARY
Glucose-Capillary: 77 mg/dL (ref 70–99)
Glucose-Capillary: 114 mg/dL — ABNORMAL HIGH (ref 70–99)
Glucose-Capillary: 102 mg/dL — ABNORMAL HIGH (ref 70–99)
Glucose-Capillary: 98 mg/dL (ref 70–99)

## 2022-02-03 MED ORDER — POTASSIUM CHLORIDE CRYS ER 20 MEQ PO TBCR
40.0000 meq | EXTENDED_RELEASE_TABLET | Freq: Once | ORAL | Status: AC
  Administered 2022-02-03: 40 meq via ORAL
  Filled 2022-02-03: qty 2

## 2022-02-03 MED ORDER — SODIUM CHLORIDE 0.9 % IV SOLN
2.0000 g | INTRAVENOUS | Status: DC
  Administered 2022-02-03 – 2022-02-06 (×4): 2 g via INTRAVENOUS
  Filled 2022-02-03 (×5): qty 20

## 2022-02-03 MED ORDER — POTASSIUM CHLORIDE 10 MEQ/100ML IV SOLN
10.0000 meq | INTRAVENOUS | Status: AC
  Administered 2022-02-03 (×2): 10 meq via INTRAVENOUS
  Filled 2022-02-03: qty 100

## 2022-02-03 MED ORDER — POTASSIUM CHLORIDE 10 MEQ/100ML IV SOLN
10.0000 meq | INTRAVENOUS | Status: AC
  Administered 2022-02-03 (×2): 10 meq via INTRAVENOUS
  Filled 2022-02-03 (×3): qty 100

## 2022-02-03 MED ORDER — FUROSEMIDE 10 MG/ML IJ SOLN
20.0000 mg | Freq: Every day | INTRAMUSCULAR | Status: DC
  Administered 2022-02-03 – 2022-02-12 (×9): 20 mg via INTRAVENOUS
  Filled 2022-02-03 (×10): qty 2

## 2022-02-03 NOTE — Progress Notes (Signed)
ANTICOAGULATION CONSULT NOTE - Initial Consult  Pharmacy Consult for warfarin Indication: Hx of DVT  Allergies  Allergen Reactions   Penicillins Hives    Has patient had a PCN reaction causing immediate rash, facial/tongue/throat swelling, SOB or lightheadedness with hypotension: Yes Has patient had a PCN reaction causing severe rash involving mucus membranes or skin necrosis: No Has patient had a PCN reaction that required hospitalization: No Has patient had a PCN reaction occurring within the last 10 years: No If all of the above answers are "NO", then may proceed with Cephalosporin use.    Vancomycin Rash    Developed a red rash bilaterally on her arms, legs, neck, chest and abdomen   Penicillin G Benzathine Rash   Sulfa Antibiotics Hives and Rash    Other reaction(s): Other (See Comments) Other reaction(s): Unknown    Patient Measurements: Height: '5\' 6"'$  (167.6 cm) Weight: 123.4 kg (272 lb 0.8 oz) IBW/kg (Calculated) : 59.3   Vital Signs: Temp: 98.9 F (37.2 C) (08/20 0935) Temp Source: Oral (08/20 0935) BP: 96/57 (08/20 0935) Pulse Rate: 75 (08/20 0935)  Labs: Recent Labs    02/01/22 1612 02/01/22 2246 02/02/22 0025 02/02/22 0352 02/03/22 1027  HGB 13.1  --   --  11.2* 12.0  HCT 39.5  --   --  34.2* 36.3  PLT 196  --   --  135* 146*  LABPROT 30.1*  --   --  30.1* 27.5*  INR 2.9*  --   --  2.9* 2.6*  CREATININE 1.06*  --   --  0.83 0.76  TROPONINIHS  --  30* 29*  --   --      Estimated Creatinine Clearance: 97.7 mL/min (by C-G formula based on SCr of 0.76 mg/dL).   Medical History: Past Medical History:  Diagnosis Date   Cellulitis    Diabetes mellitus without complication (HCC)    Diastolic heart failure (HCC)    Echo 03/14/15 Grade I DD   Fibromyalgia    History of DVT (deep vein thrombosis)    Hypertension    Rheumatoid arthritis ([place])    Sepsis ([place]) 12/2019     Assessment: 61 yoF minimally responsive suspected to have UTI and or leg  infection. Patient is on warfarin prior to admission last dose 8/17. HgB/PLT WNL  PTA warfarin dose:  Take 1 tablet ('5mg'$ ) Monday, Wednesday, Friday and Saturday and 1/2 TABLET (2.'5mg'$ ) on Sundays, Tuesdays and Thursdays   INR 2.6 today. Will change to monitoring MWF  Goal of Therapy:  INR 2-3 Monitor platelets by anticoagulation protocol: Yes   Plan:  Coumadin 2.'5mg'$  qday except '5mg'$  TThSun INR MWF  Onnie Boer, PharmD, BCIDP, AAHIVP, CPP Infectious Disease Pharmacist 02/03/2022 12:09 PM

## 2022-02-03 NOTE — Progress Notes (Signed)
PROGRESS NOTE Brittany Tran  FBP:[id number] DOB: 12-Jan-1961 DOA: 02/01/2022 PCP: Curt Jews, PA-C   Brief Narrative/Hospital Course: 12 y/of w/ DM2,morbid obesity bmi 43, chronic venous stasis ulceration worse on LLE, was found by neighbors slumped in her chair and minimally responsive after they did not hear from  x 12 hrs,EMS was called and  transported to [facility] for evaluation with a concern for UTI and or leg infection and sepsis. In ED she reported she could not get up from her chair at home, denied loss of consciousness or fall. She has a prior h/o  sepsis related to cellulitis  In ED: FEVER W/ temp 103.3 HR 125  BP 126/62. Patient was very confused and and disoriented. She was found to have a UTI but also had a red angry left LE. Code Sepsis initiated and patient received 2L LR, IV Rocephin, vancomycin.Lactic acid #1 2.4, # 2 1.8. WBC 29.5 with 80/13/6.  Patient was admitted     Subjective: Seen and examined. No new complaints. Overnight afebrile, on room air BP stable Labs pending TODAY- wbc was trending down  She reported she could not get up from her chair at home, denies loss of consciousness or fall  Assessment and Plan: Principal Problem:   Cellulitis of left leg Active Problems:   Sepsis due to cellulitis (HCC)   Cellulitis of left lower extremity   Diabetes mellitus without complication (HCC)   Chronic diastolic CHF (congestive heart failure) (HCC)   Anticoagulation goal of INR 2 to 3   Renal lesion   Rheumatoid arthritis (HCC)   Hypertension   Skin yeast infection   Sepsis POA due to cellulitis UTI Left lower extremity cellulitis UTI POA: Has venous stasis ulcer chronically with edematous lower extremity more on left and had previous cellulitis.continue empiric vancomycin, Rocephin.urine culture pending, blood culture no growth so far.  Leukocytosis downtrending.  Repeat CBC.  Was placed on Lasix to help with leg edema she refused after first dose 8/19-we  discussed for dose in the morning Recent Labs  Lab 02/01/22 1612 02/01/22 1831 02/02/22 0352  WBC 29.5*  --  18.2*  LATICACIDVEN 2.4* 1.8  --     Renal lesion:CT abdomen patient with 4.8x3.4x4.1 exophytic lesion anterior aspect mid-upper right kidney. Hemorrhagic cyst vs tumor.  Will need MRI when patient more stable. UA w / rbs 11-20.  History of recurrent DVT : anticoagulated on Coumadin pharmacy dosing, was therapeutic, inr pending today. Recent Labs  Lab 02/01/22 1612 02/02/22 0352  INR 2.9* 2.9*   Chronic diastolic CHF- w/ mild acute component with orthopnea  Extensive lower leg edema: Patient does endorse SOB/DOE, worse when she is sick. She is sedentary. Last Echo 03/14/15 with Grade I DD and mild LVH.  Lungs are fairly clear but suboptimal exam due to her body size, follow-up echocardiogram.  CTA limited but no definitive PE, has mild cardiomegaly with mild to moderate severity pulmonary vascular congestion-will benefit with diuresis.  Continue IV Lasix but she refused yesterday evening- changed to daily, monitor BP and creatinine  Intake/Output Summary (Last 24 hours) at 02/03/2022 1052 Last data filed at 02/03/2022 0619 Gross per 24 hour  Intake 1215 ml  Output 1400 ml  Net -185 ml    Diabetes mellitus on long-term insulin: Current A1c 6.4.Blood sugar stable to lower side- hold   her 10 units Semglee, cont only SSI and monitor Recent Labs  Lab 02/02/22 0025 02/02/22 0350 02/02/22 1047 02/02/22 1147 02/02/22 1609 02/02/22 2049 02/03/22  0732  GLUCAP  --    < > 79 87 100* 95 77  HGBA1C 6.4*  --   --   --   --   --   --    < > = values in this interval not displayed.    Skin yeast infection under panniculus: cont nystatin powder bid  Hypertension: BP stale.Home meds on hold  Rheumatoid arthritis: Without acute flareup, continue to hold methotrexate 2/2 infection, resume soon and cont low-dose prednisone  Morbid obesity BMI 43 Will benefit with weight  loss Deconditioning debility obtain PT OT evaluation  DVT prophylaxis: coumadin Code Status:   Code Status: Full Code Family Communication: plan of care discussed with patient at bedside. Patient status is: Inpatient because of ongoing management of infection Level of care: Telemetry Medical   Dispo: The patient is from: home lives by herself, reports she is mobile            Anticipated disposition: TBD obtain PT OT  Mobility Assessment (last 72 hours)     Mobility Assessment     Row Name 02/02/22 1515           Does patient have an order for bedrest or is patient medically unstable No - Continue assessment       What is the highest level of mobility based on the progressive mobility assessment? Level 5 (Walks with assist in room/hall) - Balance while stepping forward/back and can walk in room with assist - Complete                 Objective: Vitals last 24 hrs: Vitals:   02/02/22 1610 02/02/22 2051 02/03/22 0504 02/03/22 0935  BP: 126/82 129/81 110/61 (!) 96/57  Pulse: 97 81 77 75  Resp: '17 17 18   '$ Temp: 98.3 F (36.8 C) 99.2 F (37.3 C) 98.6 F (37 C) 98.9 F (37.2 C)  TempSrc:    Oral  SpO2: 96% 97% 99% 99%  Weight:      Height:       Weight change:   Physical Examination: General exam:AA OX3, older than stated age, weak appearing. HEENT:Oral mucosa moist, Ear/Nose WNL grossly, dentition normal. Respiratory system:B/l diminished, no use of accessory muscle Cardiovascular system:S1 & S2 +, No JVD,. Gastrointestinal system:Abdomen soft,NT,ND,BS+ Nervous System:Alert, awake, moving extremities and grossly nonfocal Extremities: LE ankle edema ++ W/ pigmentation  Skin: No rashes,no icterus. MSK: Normal muscle bulk,tone, power   Medications reviewed:  Scheduled Meds:  folic acid  2 mg Oral Daily   furosemide  20 mg Intravenous Daily   gabapentin  300 mg Oral TID   gabapentin  400 mg Oral QHS   insulin aspart  0-20 Units Subcutaneous TID WC   insulin  glargine-yfgn  10 Units Subcutaneous QHS   nystatin   Topical BID   predniSONE  5 mg Oral Q breakfast   topiramate  25 mg Oral BID   warfarin  2.5 mg Oral Once per day on Sun Tue Thu   warfarin  5 mg Oral Once per day on Mon Wed Fri Sat   Warfarin - Pharmacist Dosing Inpatient   Does not apply q1600   Continuous Infusions:  cefTRIAXone (ROCEPHIN)  IV     vancomycin 75 mL/hr at 02/03/22 3154      Diet Order             Diet heart healthy/carb modified Room service appropriate? Yes; Fluid consistency: Thin  Diet effective now  Intake/Output Summary (Last 24 hours) at 02/03/2022 1052 Last data filed at 02/03/2022 0619 Gross per 24 hour  Intake 1215 ml  Output 1400 ml  Net -185 ml   Net IO Since Admission: 2,928.96 mL [02/03/22 1052]  Wt Readings from Last 3 Encounters:  02/01/22 123.4 kg  01/09/22 123.4 kg  12/04/21 131.1 kg     Unresulted Labs (From admission, onward)     Start     Ordered   02/04/22 8185  Basic metabolic panel  Daily at 5am,   R      02/03/22 0853   02/04/22 0500  CBC  Daily at 5am,   R      02/03/22 0853   02/03/22 1018  Protime-INR  Daily at 5am,   R      02/03/22 1017   02/03/22 0854  CBC  ONCE - URGENT,   URGENT        02/03/22 0853   02/03/22 6314  Basic metabolic panel  ONCE - URGENT,   URGENT        02/03/22 0853   02/01/22 1621  APTT  (Undifferentiated presentation (screening labs and basic nursing orders))  ONCE - STAT,   STAT        02/01/22 1622          Data Reviewed: I have personally reviewed following labs and imaging studies CBC: Recent Labs  Lab 02/01/22 1612 02/02/22 0352  WBC 29.5* 18.2*  HGB 13.1 11.2*  HCT 39.5 34.2*  MCV 99.7 102.7*  PLT 196 970*   Basic Metabolic Panel: Recent Labs  Lab 02/01/22 1612 02/02/22 0352  NA 141 139  K 3.8 3.5  CL 109 110  CO2 25 23  GLUCOSE 138* 96  BUN 14 12  CREATININE 1.06* 0.83  CALCIUM 8.4* 7.8*   GFR: Estimated Creatinine Clearance: 94.2 mL/min  (by C-G formula based on SCr of 0.83 mg/dL). Liver Function Tests: Recent Labs  Lab 02/01/22 1612  AST 27  ALT 11  ALKPHOS 104  BILITOT 1.1  PROT 7.2  ALBUMIN 2.9*   No results for input(s): "LIPASE", "AMYLASE" in the last 168 hours. No results for input(s): "AMMONIA" in the last 168 hours. Coagulation Profile: Recent Labs  Lab 02/01/22 1612 02/02/22 0352  INR 2.9* 2.9*   BNP (last 3 results) No results for input(s): "PROBNP" in the last 8760 hours. HbA1C: Recent Labs    02/02/22 0025  HGBA1C 6.4*   CBG: Recent Labs  Lab 02/02/22 1047 02/02/22 1147 02/02/22 1609 02/02/22 2049 02/03/22 0732  GLUCAP 79 87 100* 95 77   Lipid Profile: No results for input(s): "CHOL", "HDL", "LDLCALC", "TRIG", "CHOLHDL", "LDLDIRECT" in the last 72 hours. Thyroid Function Tests: No results for input(s): "TSH", "T4TOTAL", "FREET4", "T3FREE", "THYROIDAB" in the last 72 hours. Sepsis Labs: Recent Labs  Lab 02/01/22 1612 02/01/22 1831  LATICACIDVEN 2.4* 1.8    Recent Results (from the past 240 hour(s))  Culture, blood (Routine x 2)     Status: None (Preliminary result)   Collection Time: 02/01/22  4:03 PM   Specimen: BLOOD  Result Value Ref Range Status   Specimen Description BLOOD LEFT ANTECUBITAL  Final   Special Requests   Final    BOTTLES DRAWN AEROBIC AND ANAEROBIC Blood Culture adequate volume   Culture   Final    NO GROWTH 2 DAYS Performed at [facility], [address] [place], [place] [address]    Report Status PENDING  Incomplete  Culture,  blood (Routine x 2)     Status: None (Preliminary result)   Collection Time: 02/01/22  4:11 PM   Specimen: BLOOD  Result Value Ref Range Status   Specimen Description BLOOD RIGHT ANTECUBITAL  Final   Special Requests   Final    BOTTLES DRAWN AEROBIC AND ANAEROBIC Blood Culture results may not be optimal due to an excessive volume of blood received in culture bottles   Culture   Final    NO GROWTH 2 DAYS Performed at  [facility], [place] [address], [place], [place] [address]    Report Status PENDING  Incomplete  Urine Culture     Status: None (Preliminary result)   Collection Time: 02/01/22  4:21 PM   Specimen: In/Out Cath Urine  Result Value Ref Range Status   Specimen Description IN/OUT CATH URINE  Final   Special Requests NONE  Final   Culture   Final    CULTURE REINCUBATED FOR BETTER GROWTH Performed at [facility], [place] [address] [place], [place] [address]    Report Status PENDING  Incomplete    Antimicrobials: Anti-infectives (From admission, onward)    Start     Dose/Rate Route Frequency Ordered Stop   02/03/22 1700  cefTRIAXone (ROCEPHIN) 2 g in sodium chloride 0.9 % 100 mL IVPB        2 g 200 mL/hr over 30 Minutes Intravenous Every 24 hours 02/03/22 1018     02/02/22 1700  cefTRIAXone (ROCEPHIN) 1 g in sodium chloride 0.9 % 100 mL IVPB  Status:  Discontinued        1 g 200 mL/hr over 30 Minutes Intravenous Every 24 hours 02/02/22 0154 02/03/22 1018   02/02/22 1100  vancomycin (VANCOREADY) IVPB 750 mg/150 mL        750 mg 75 mL/hr over 120 Minutes Intravenous Every 12 hours 02/01/22 2229     02/01/22 2145  vancomycin (VANCOREADY) IVPB 1750 mg/350 mL        1,750 mg 116.7 mL/hr over 180 Minutes Intravenous  Once 02/01/22 2143 02/02/22 0054   02/01/22 1645  cefTRIAXone (ROCEPHIN) 2 g in sodium chloride 0.9 % 100 mL IVPB        2 g 200 mL/hr over 30 Minutes Intravenous  Once 02/01/22 1633 02/01/22 1843      Culture/Microbiology    Component Value Date/Time   SDES IN/OUT CATH URINE 02/01/2022 1621   SPECREQUEST NONE 02/01/2022 1621   CULT  02/01/2022 1621    CULTURE REINCUBATED FOR BETTER GROWTH Performed at [facility] [address] [place], [place] [address]    REPTSTATUS PENDING 02/01/2022 1621    Other culture-see note  Radiology Studies: ECHOCARDIOGRAM COMPLETE  Result Date: 02/02/2022    ECHOCARDIOGRAM REPORT   Patient Name:   LUDMILA EBARB [facility]  Date of Exam: 02/02/2022 Medical Rec #:  [id number]          Height:       66.0 in Accession #:    [id number]         Weight:       272.0 lb Date of Birth:  Jan 10, 1961          BSA:          2.279 m Patient Age:    61 years           BP:           133/73 mmHg Patient Gender: F  HR:           78 bpm. Exam Location:  Inpatient Procedure: 2D Echo, Cardiac Doppler, Color Doppler and Intracardiac            Opacification Agent Indications:    CHF - acute diastolic  History:        Patient has prior history of Echocardiogram examinations, most                 recent 03/13/2017. Risk Factors:Hypertension and Diabetes.  Sonographer:    Eartha Inch Referring Phys: 17 MICHAEL E NORINS  Sonographer Comments: Suboptimal parasternal window, suboptimal apical window, suboptimal subcostal window and patient is obese. Image acquisition challenging due to patient body habitus. IMPRESSIONS  1. Left ventricular ejection fraction, by estimation, is 60 to 65%. The left ventricle has normal function. The left ventricle has no regional wall motion abnormalities. Left ventricular diastolic parameters are consistent with Grade I diastolic dysfunction (impaired relaxation).  2. Right ventricular systolic function is normal. The right ventricular size is normal.  3. The mitral valve is normal in structure. No evidence of mitral valve regurgitation. No evidence of mitral stenosis.  4. The aortic valve has an indeterminant number of cusps. Aortic valve regurgitation is not visualized. No aortic stenosis is present.  5. The inferior vena cava is normal in size with greater than 50% respiratory variability, suggesting right atrial pressure of 3 mmHg. FINDINGS  Left Ventricle: Left ventricular ejection fraction, by estimation, is 60 to 65%. The left ventricle has normal function. The left ventricle has no regional wall motion abnormalities. Definity contrast agent was given IV to delineate the left ventricular  endocardial  borders. The left ventricular internal cavity size was normal in size. There is no left ventricular hypertrophy. Left ventricular diastolic parameters are consistent with Grade I diastolic dysfunction (impaired relaxation). Normal left ventricular filling pressure. Right Ventricle: The right ventricular size is normal. Right vetricular wall thickness was not well visualized. Right ventricular systolic function is normal. Left Atrium: Left atrial size was normal in size. Right Atrium: Right atrial size was normal in size. Pericardium: There is no evidence of pericardial effusion. Mitral Valve: The mitral valve is normal in structure. No evidence of mitral valve regurgitation. No evidence of mitral valve stenosis. Tricuspid Valve: The tricuspid valve is normal in structure. Tricuspid valve regurgitation is not demonstrated. No evidence of tricuspid stenosis. Aortic Valve: The aortic valve has an indeterminant number of cusps. Aortic valve regurgitation is not visualized. No aortic stenosis is present. Aortic valve mean gradient measures 6.9 mmHg. Aortic valve peak gradient measures 16.8 mmHg. Aortic valve area, by VTI measures 1.93 cm. Pulmonic Valve: The pulmonic valve was not well visualized. Pulmonic valve regurgitation is not visualized. No evidence of pulmonic stenosis. Aorta: The aortic root is normal in size and structure. Venous: The inferior vena cava is normal in size with greater than 50% respiratory variability, suggesting right atrial pressure of 3 mmHg. IAS/Shunts: No atrial level shunt detected by color flow Doppler.  LEFT VENTRICLE PLAX 2D LVIDd:         4.40 cm     Diastology LVIDs:         3.30 cm     LV e' medial:    8.92 cm/s LV PW:         1.00 cm     LV E/e' medial:  9.6 LV IVS:        0.90 cm     LV e' lateral:  10.70 cm/s LVOT diam:     2.00 cm     LV E/e' lateral: 8.0 LV SV:         81 LV SV Index:   36 LVOT Area:     3.14 cm  LV Volumes (MOD) LV vol d, MOD A2C: 51.7 ml LV vol d, MOD A4C:  46.4 ml LV vol s, MOD A2C: 24.0 ml LV vol s, MOD A4C: 15.6 ml LV SV MOD A2C:     27.7 ml LV SV MOD A4C:     46.4 ml LV SV MOD BP:      32.8 ml RIGHT VENTRICLE             IVC RV S prime:     12.50 cm/s  IVC diam: 1.80 cm TAPSE (M-mode): 1.8 cm LEFT ATRIUM             Index LA diam:        3.50 cm 1.54 cm/m LA Vol (A2C):   33.7 ml 14.79 ml/m LA Vol (A4C):   48.4 ml 21.24 ml/m LA Biplane Vol: 41.8 ml 18.34 ml/m  AORTIC VALVE AV Area (Vmax):    1.84 cm AV Area (Vmean):   2.36 cm AV Area (VTI):     1.93 cm AV Vmax:           205.11 cm/s AV Vmean:          118.622 cm/s AV VTI:            0.422 m AV Peak Grad:      16.8 mmHg AV Mean Grad:      6.9 mmHg LVOT Vmax:         120.00 cm/s LVOT Vmean:        89.100 cm/s LVOT VTI:          0.259 m LVOT/AV VTI ratio: 0.61  AORTA Ao Root diam: 3.10 cm MITRAL VALVE MV Area (PHT): 2.69 cm     SHUNTS MV Decel Time: 282 msec     Systemic VTI:  0.26 m MV E velocity: 85.60 cm/s   Systemic Diam: 2.00 cm MV A velocity: 107.00 cm/s MV E/A ratio:  0.80 Carlyle Dolly MD Electronically signed by Carlyle Dolly MD Signature Date/Time: 02/02/2022/1:00:37 PM    Final    CT ABDOMEN PELVIS W CONTRAST  Result Date: 02/01/2022 CLINICAL DATA:  Generalized weakness. EXAM: CT ABDOMEN AND PELVIS WITH CONTRAST TECHNIQUE: Multidetector CT imaging of the abdomen and pelvis was performed using the standard protocol following bolus administration of intravenous contrast. RADIATION DOSE REDUCTION: This exam was performed according to the departmental dose-optimization program which includes automated exposure control, adjustment of the mA and/or kV according to patient size and/or use of iterative reconstruction technique. CONTRAST:  16m OMNIPAQUE IOHEXOL 350 MG/ML SOLN COMPARISON:  None Available. FINDINGS: Lower chest: Mild atelectasis is seen within the bilateral lung bases. Hepatobiliary: There is diffuse fatty infiltration of the liver parenchyma. No focal liver abnormality is seen.  Ill-defined gallstones are seen within the gallbladder lumen, without evidence of gallbladder wall thickening or biliary dilatation. Pancreas: Diffuse fatty infiltration of the pancreatic parenchyma is seen. No pancreatic ductal dilatation or surrounding inflammatory changes. Spleen: Normal in size without focal abnormality. Adrenals/Urinary Tract: The bilateral adrenal glands are unremarkable. A 13.1 cm by 9.0 cm x 12.2 cm simple cyst is seen within the posterolateral aspect of the right kidney. A 4.8 cm x 3.4 cm x 4.1 cm exophytic, heterogeneous mildly hyperdense lesion (approximately 82.46  Hounsfield units) is seen within the anterior aspect of the mid to upper right kidney. A 1.8 cm x 1.4 cm x 2.1 cm cystic appearing area is seen adjacent to the posterolateral aspect of the mid left kidney (approximately 49.00 Hounsfield units). Bilateral 2 mm, 3 mm and 4 mm nonobstructing renal calculi are seen. There is no evidence of hydronephrosis or hydroureter. The urinary bladder is poorly distended and subsequently limited in evaluation. Stomach/Bowel: Stomach is within normal limits. Appendix appears normal. No evidence of bowel wall thickening, distention, or inflammatory changes. Vascular/Lymphatic: No significant vascular findings are present. Multiple tiny para-aortic and aortocaval lymph nodes are noted. A 2.2 cm x 1.5 cm x 1.9 cm posterior left iliac chain lymph node is seen. Several adjacent tiny pelvic lymph nodes are also noted. Reproductive: Uterus and bilateral adnexa are unremarkable. Other: There is a small fat containing umbilical hernia. No abdominopelvic ascites. Musculoskeletal: No acute or significant osseous findings. IMPRESSION: 1. 4.8 cm x 3.4 cm x 4.1 cm exophytic lesion within the anterior aspect of the mid to upper right kidney which may represent a hemorrhagic cyst. Correlation with MRI (without and with IV contrast) is recommended to exclude the presence of an underlying neoplastic process. 2.  Very large simple right renal cyst with a much smaller exophytic cystic-appearing area seen along the posteromedial aspect of the mid left kidney. 3. Bilateral nonobstructing renal calculi. 4. Cholelithiasis. 5. Enlarged posterior left iliac chain lymph node which may be reactive in nature. Electronically Signed   By: Virgina Norfolk M.D.   On: 02/01/2022 20:36   CT Angio Chest PE W and/or Wo Contrast  Result Date: 02/01/2022 CLINICAL DATA:  Generalized weakness and history of recurrent DVT. EXAM: CT ANGIOGRAPHY CHEST WITH CONTRAST TECHNIQUE: Multidetector CT imaging of the chest was performed using the standard protocol during bolus administration of intravenous contrast. Multiplanar CT image reconstructions and MIPs were obtained to evaluate the vascular anatomy. RADIATION DOSE REDUCTION: This exam was performed according to the departmental dose-optimization program which includes automated exposure control, adjustment of the mA and/or kV according to patient size and/or use of iterative reconstruction technique. CONTRAST:  152m OMNIPAQUE IOHEXOL 350 MG/ML SOLN COMPARISON:  None Available. FINDINGS: Cardiovascular: There is mild calcification of the aortic arch, without evidence of aortic aneurysm. The segmental and subsegmental pulmonary arteries are limited in evaluation secondary to suboptimal opacification with intravenous contrast and motion artifact. No evidence of pulmonary embolism. Prominence of the perihilar pulmonary vasculature is noted. There is mild cardiomegaly. No pericardial effusion. Mediastinum/Nodes: No enlarged mediastinal, hilar, or axillary lymph nodes. Thyroid gland, trachea, and esophagus demonstrate no significant findings. Lungs/Pleura: Low lung volumes are seen which may be, in part, secondary to the degree of patient inspiration. Mild atelectatic changes are seen within the bilateral lung bases. There is no evidence of acute infiltrate, pleural effusion or pneumothorax. Upper  Abdomen: No acute abnormality. Musculoskeletal: Degenerative changes are seen involving both shoulders, with multilevel degenerative changes noted throughout the thoracic spine. Review of the MIP images confirms the above findings. IMPRESSION: 1. Limited evaluation of the segmental and subsegmental pulmonary arteries, as described above, without definite evidence of pulmonary embolism. 2. Mild cardiomegaly with mild to moderate severity pulmonary vascular congestion. 3. Mild bibasilar atelectasis. 4. Aortic atherosclerosis. Aortic Atherosclerosis (ICD10-I70.0). Electronically Signed   By: TVirgina NorfolkM.D.   On: 02/01/2022 20:18   DG Chest Port 1 View  Result Date: 02/01/2022 CLINICAL DATA:  Possible sepsis. EXAM: PORTABLE CHEST 1 VIEW COMPARISON:  Radiograph 12/28/2019 FINDINGS: Low lung volumes with moderate patient rotation. Suspected cardiomegaly. Patchy left lung base opacity. No pulmonary edema. No large pleural effusion or visualized pneumothorax. On limited assessment, no acute osseous findings. IMPRESSION: Low lung volumes with moderate patient rotation. Patchy left lung base opacity may be atelectasis or pneumonia. Electronically Signed   By: Keith Rake M.D.   On: 02/01/2022 17:19   CT Head Wo Contrast  Result Date: 02/01/2022 CLINICAL DATA:  Altered mental status EXAM: CT HEAD WITHOUT CONTRAST TECHNIQUE: Contiguous axial images were obtained from the base of the skull through the vertex without intravenous contrast. RADIATION DOSE REDUCTION: This exam was performed according to the departmental dose-optimization program which includes automated exposure control, adjustment of the mA and/or kV according to patient size and/or use of iterative reconstruction technique. COMPARISON:  None Available. FINDINGS: Brain: No acute intracranial findings are seen in noncontrast CT brain. There are no signs of bleeding within the cranium. Ventricles are nondilated. Cortical sulci are prominent.  Vascular: Scattered arterial calcifications are seen. Skull: Unremarkable. Sinuses/Orbits: Unremarkable. Other: None. IMPRESSION: No acute intracranial findings are seen in noncontrast CT brain. Atrophy. Electronically Signed   By: Elmer Picker M.D.   On: 02/01/2022 16:58     LOS: 1 day   Antonieta Pert, MD [facility]  02/03/2022, 10:52 AM

## 2022-02-04 DIAGNOSIS — L03116 Cellulitis of left lower limb: Secondary | ICD-10-CM | POA: Diagnosis not present

## 2022-02-04 DIAGNOSIS — B372 Candidiasis of skin and nail: Secondary | ICD-10-CM

## 2022-02-04 DIAGNOSIS — I5032 Chronic diastolic (congestive) heart failure: Secondary | ICD-10-CM | POA: Diagnosis not present

## 2022-02-04 DIAGNOSIS — I10 Essential (primary) hypertension: Secondary | ICD-10-CM

## 2022-02-04 DIAGNOSIS — A419 Sepsis, unspecified organism: Secondary | ICD-10-CM | POA: Diagnosis not present

## 2022-02-04 DIAGNOSIS — E876 Hypokalemia: Secondary | ICD-10-CM

## 2022-02-04 DIAGNOSIS — Z5181 Encounter for therapeutic drug level monitoring: Secondary | ICD-10-CM | POA: Diagnosis not present

## 2022-02-04 LAB — CBC
HCT: 35.5 % — ABNORMAL LOW (ref 36.0–46.0)
Hemoglobin: 11.8 g/dL — ABNORMAL LOW (ref 12.0–15.0)
MCH: 33.1 pg (ref 26.0–34.0)
MCHC: 33.2 g/dL (ref 30.0–36.0)
MCV: 99.4 fL (ref 80.0–100.0)
Platelets: 153 10*3/uL (ref 150–400)
RBC: 3.57 MIL/uL — ABNORMAL LOW (ref 3.87–5.11)
RDW: 15.6 % — ABNORMAL HIGH (ref 11.5–15.5)
WBC: 12.2 10*3/uL — ABNORMAL HIGH (ref 4.0–10.5)
nRBC: 0 % (ref 0.0–0.2)

## 2022-02-04 LAB — GLUCOSE, CAPILLARY
Glucose-Capillary: 71 mg/dL (ref 70–99)
Glucose-Capillary: 95 mg/dL (ref 70–99)
Glucose-Capillary: 103 mg/dL — ABNORMAL HIGH (ref 70–99)
Glucose-Capillary: 122 mg/dL — ABNORMAL HIGH (ref 70–99)

## 2022-02-04 LAB — BASIC METABOLIC PANEL
Anion gap: 5 (ref 5–15)
BUN: 13 mg/dL (ref 8–23)
CO2: 21 mmol/L — ABNORMAL LOW (ref 22–32)
Calcium: 7.2 mg/dL — ABNORMAL LOW (ref 8.9–10.3)
Chloride: 113 mmol/L — ABNORMAL HIGH (ref 98–111)
Creatinine, Ser: 0.7 mg/dL (ref 0.44–1.00)
GFR, Estimated: >60 mL/min (ref >60)
Glucose, Bld: 75 mg/dL (ref 70–99)
Potassium: 3.4 mmol/L — ABNORMAL LOW (ref 3.5–5.1)
Sodium: 139 mmol/L (ref 135–145)

## 2022-02-04 LAB — URINE CULTURE: Culture: 100 — AB

## 2022-02-04 LAB — PROTIME-INR
INR: 2.9 — ABNORMAL HIGH (ref 0.8–1.2)
Prothrombin Time: 30.4 seconds — ABNORMAL HIGH (ref 11.4–15.2)

## 2022-02-04 MED ORDER — LORAZEPAM 2 MG/ML IJ SOLN
1.0000 mg | Freq: Once | INTRAMUSCULAR | Status: AC
  Administered 2022-02-05: 1 mg via INTRAVENOUS
  Filled 2022-02-04: qty 1

## 2022-02-04 MED ORDER — POTASSIUM CHLORIDE CRYS ER 20 MEQ PO TBCR
40.0000 meq | EXTENDED_RELEASE_TABLET | Freq: Once | ORAL | Status: AC
  Administered 2022-02-04: 40 meq via ORAL
  Filled 2022-02-04: qty 2

## 2022-02-04 NOTE — Evaluation (Signed)
Physical Therapy Evaluation Patient Details Name: Brittany Tran MRN: [id number] DOB: [birth year] Today's Date: 02/04/2022  History of Present Illness  Patient is a 61 y/o female who presents on 8/18 for generalized weakness and found slumped in her chair minimally responsive by her neighbors. Found to have UTI and sepsis secondary to LLE cellulitis. PMH includes DM, HF, HTN, DVT, fibromyalgia, RA.  Clinical Impression  Patient presents with generalized weakness, deconditioning, pain, decreased activity tolerance and impaired mobility s/p above. Pt lives at home alone and reports using [facility] for ambulation PTA. Pt sleep in a lift chair and is sedentary at baseline. Works 3 days/week at the office and reports difficulty with walking to/from building as well as standing from chairs/toilets at work. Today, pt requires Mod A for bed mobility with increased time/effort, heavy Mod A-Max A to stand from elevated surface and MIn guard assist for ambulation with use of RW. Pt not able to tolerate using SPC for ambulation today due to weakness/pain in BLEs. Refuses to use RW at home despite increased safety. Pt with poor awareness of deficits and how they will impact mobility/safety at home. Pt has to negotiate 1 flight of stairs to get into condo-unsure she can do these at this time. Declining SNF. Will follow acutely to maximize independence and mobility prior to return home.       Recommendations for follow up therapy are one component of a multi-disciplinary discharge planning process, led by the attending physician.  Recommendations may be updated based on patient status, additional functional criteria and insurance authorization.  Follow Up Recommendations Home health PT (refusing any help/DME/placement)      Assistance Recommended at Discharge Frequent or constant Supervision/Assistance  Patient can return home with the following  A lot of help with walking and/or transfers;Assistance with  cooking/housework;Assist for transportation;Help with stairs or ramp for entrance;A little help with bathing/dressing/bathroom    Equipment Recommendations Other (comment) (needs RW but refusing)  Recommendations for Other Services       Functional Status Assessment Patient has had a recent decline in their functional status and demonstrates the ability to make significant improvements in function in a reasonable and predictable amount of time.     Precautions / Restrictions Precautions Precautions: Fall Restrictions Weight Bearing Restrictions: No      Mobility  Bed Mobility Overal bed mobility: Needs Assistance Bed Mobility: Supine to Sit, Sit to Supine     Supine to sit: Mod assist, HOB elevated Sit to supine: Mod assist   General bed mobility comments: ASsist with scooting bottom to EOB and with trunk elevation, use of rail and increased time for all movements. ASsist to bring LES into bed. Pt sleep in lift chair at home.    Transfers Overall transfer level: Needs assistance Equipment used: Straight cane Transfers: Sit to/from Stand Sit to Stand: Max assist, From elevated surface, Mod assist           General transfer comment: Heavy Mod-Max A to stand from EOB with increased time, cues for hand/foot placement and elevated bed height, increased effort, difficulty.    Ambulation/Gait Ambulation/Gait assistance: Min guard Gait Distance (Feet): 24 Feet Assistive device: Rolling walker (2 wheels) Gait Pattern/deviations: Step-through pattern, Decreased stride length, Trunk flexed Gait velocity: decreased Gait velocity interpretation: <1.31 ft/sec, indicative of household ambulator   General Gait Details: Slow, mildly unsteady gait with use of RW for support. NOt safe to ambulate with SPC today due to weakness/pain.  Stairs  Wheelchair Mobility    Modified Rankin (Stroke Patients Only)       Balance Overall balance assessment: Needs  assistance Sitting-balance support: Feet supported, No upper extremity supported Sitting balance-Leahy Scale: Fair Sitting balance - Comments: Prefers UE support but able to sit without it   Standing balance support: During functional activity, Single extremity supported, Bilateral upper extremity supported Standing balance-Leahy Scale: Poor Standing balance comment: Able to stand with 1 UE support statically but needs BUE support for walking                             Pertinent Vitals/Pain Pain Assessment Pain Assessment: 0-10 Pain Score: 8  Pain Location: BLEs, bil shoulders Pain Descriptors / Indicators: Sore, Constant, Discomfort Pain Intervention(s): Monitored during session, Repositioned    Home Living Family/patient expects to be discharged to:: Private residence Living Arrangements: Alone Available Help at Discharge: Neighbor;Available PRN/intermittently Type of Home: Other(Comment) (condo) Home Access: Stairs to enter Entrance Stairs-Rails: Right Entrance Stairs-Number of Steps: 15   Home Layout: One level Home Equipment: Cane - single point;Toilet riser      Prior Function Prior Level of Function : Independent/Modified Independent             Mobility Comments: Uses SPC for ambulation, works in Therap[employer], art, goes to office 3 days/week. Drives. Sleeps in lift chair at home. ADLs Comments: independent     Hand Dominance        Extremity/Trunk Assessment   Upper Extremity Assessment Upper Extremity Assessment: Defer to OT evaluation    Lower Extremity Assessment Lower Extremity Assessment: Generalized weakness (Redness, rash looking appearance, warmth, edema present BLEs)       Communication   Communication: No difficulties  Cognition Arousal/Alertness: Awake/alert Behavior During Therapy: Flat affect, WFL for tasks assessed/performed Overall Cognitive Status: No family/caregiver present to determine baseline cognitive functioning                                  General Comments: Slow processing, flat affect. Poor understanding of current deficits and their impact on function and living alone.        General Comments      Exercises     Assessment/Plan    PT Assessment Patient needs continued PT services  PT Problem List Decreased strength;Decreased mobility;Decreased safety awareness;Obesity;Pain;Decreased balance;Decreased knowledge of use of DME;Decreased activity tolerance;Decreased skin integrity       PT Treatment Interventions Therapeutic activities;DME instruction;Gait training;Therapeutic exercise;Stair training;Balance training;Patient/family education;Neuromuscular re-education;Functional mobility training    PT Goals (Current goals can be found in the Care Plan section)  Acute Rehab PT Goals Patient Stated Goal: to go home PT Goal Formulation: With patient Time For Goal Achievement: 02/18/22 Potential to Achieve Goals: Fair    Frequency Min 3X/week     Co-evaluation               AM-PAC PT "6 Clicks" Mobility  Outcome Measure Help needed turning from your back to your side while in a flat bed without using bedrails?: A Little Help needed moving from lying on your back to sitting on the side of a flat bed without using bedrails?: A Lot Help needed moving to and from a bed to a chair (including a wheelchair)?: A Lot Help needed standing up from a chair using your arms (e.g., wheelchair or bedside chair)?: A Lot Help needed to walk  in hospital room?: Total Help needed climbing 3-5 steps with a railing? : A Lot 6 Click Score: 12    End of Session   Activity Tolerance: Patient limited by pain;Patient limited by fatigue Patient left: in bed;with call bell/phone within reach;with bed alarm set Nurse Communication: Mobility status PT Visit Diagnosis: Pain;Muscle weakness (generalized) (M62.81);Difficulty in walking, not elsewhere classified (R26.2) Pain - Right/Left:   (bil) Pain - part of body: Leg    Time: 0750-0827 PT Time Calculation (min) (ACUTE ONLY): 37 min   Charges:   PT Evaluation $PT Eval Moderate Complexity: 1 Mod PT Treatments $Therapeutic Activity: 8-22 mins        Marisa Severin, PT, DPT Acute Rehabilitation Services Secure chat preferred Office [place] 02/04/2022, 10:24 AM

## 2022-02-04 NOTE — Progress Notes (Signed)
[place] for warfarin Indication: Hx of DVT  Allergies  Allergen Reactions   Penicillins Hives    Has patient had a PCN reaction causing immediate rash, facial/tongue/throat swelling, SOB or lightheadedness with hypotension: Yes Has patient had a PCN reaction causing severe rash involving mucus membranes or skin necrosis: No Has patient had a PCN reaction that required hospitalization: No Has patient had a PCN reaction occurring within the last 10 years: No If all of the above answers are "NO", then may proceed with Cephalosporin use.    Vancomycin Rash    Developed a red rash bilaterally on her arms, legs, neck, chest and abdomen   Penicillin G Benzathine Rash   Sulfa Antibiotics Hives and Rash    Other reaction(s): Other (See Comments) Other reaction(s): Unknown      Labs: Recent Labs    02/01/22 2246 02/02/22 0025 02/02/22 0352 02/03/22 1027 02/04/22 0440  HGB  --   --  11.2* 12.0 11.8*  HCT  --   --  34.2* 36.3 35.5*  PLT  --   --  135* 146* 153  LABPROT  --   --  30.1* 27.5* 30.4*  INR  --   --  2.9* 2.6* 2.9*  CREATININE  --   --  0.83 0.76 0.70  TROPONINIHS 30* 29*  --   --   --      Estimated Creatinine Clearance: 97.7 mL/min (by C-G formula based on SCr of 0.7 mg/dL). Assessment: 59 yoF minimally responsive suspected to have UTI and or leg infection. Patient is on warfarin prior to admission last dose 8/17. HgB/PLT WNL  PTA warfarin dose:  Take 1 tablet ('5mg'$ ) Monday, Wednesday, Friday and Saturday and 1/2 TABLET (2.'5mg'$ ) on Sundays, Tuesdays and Thursdays   INR therapeutic today.  Goal of Therapy:  INR 2-3 Monitor platelets by anticoagulation protocol: Yes   Plan:  Coumadin 2.'5mg'$  qday except '5mg'$  TThSun INR MWF  Thank you Anette Guarneri, PharmD 02/04/2022 7:43 AM

## 2022-02-04 NOTE — TOC Initial Note (Signed)
Transition of Care [facility]) - Initial/Assessment Note    Patient Details  Name: Brittany Tran MRN: [id number] Date of Birth: 08-Oct-1960  [facility] CM/SW Contact:    Pollie Friar, RN Phone Number: 02/04/2022, 4:10 PM  Clinical Narrative:                 Pt is from home alone. She was managing her own medications and driving self without issues.  She states she has a friend that will check on her and provide transport home when ready for d/c.  Recommendations for [facility] services. CM will work on getting this arranged prior to d/c home.  TOC following.  Expected Discharge Plan: [place] Barriers to Discharge: Continued Medical Work up   Patient Goals and CMS Choice   CMS Medicare.gov Compare Post Acute Care list provided to:: Patient Choice offered to / list presented to : Patient  Expected Discharge Plan and Services Expected Discharge Plan: [place]   Discharge Planning Services: CM Consult Post Acute Care Choice: [place] arrangements for the past 2 months: Apartment                                      Prior Living Arrangements/Services Living arrangements for the past 2 months: Apartment Lives with:: Self Patient language and need for interpreter reviewed:: Yes Do you feel safe going back to the place where you live?: Yes          Current home services: DME (cane) Criminal Activity/Legal Involvement Pertinent to Current Situation/Hospitalization: No - Comment as needed  Activities of Daily Living Home Assistive Devices/Equipment: Cane (specify quad or straight) ADL Screening (condition at time of admission) Patient's cognitive ability adequate to safely complete daily activities?: Yes Is the patient deaf or have difficulty hearing?: No Does the patient have difficulty seeing, even when wearing glasses/contacts?: No Does the patient have difficulty concentrating, remembering, or making  decisions?: Yes Patient able to express need for assistance with ADLs?: Yes Does the patient have difficulty dressing or bathing?: No Independently performs ADLs?: Yes (appropriate for developmental age) Does the patient have difficulty walking or climbing stairs?: Yes Weakness of Legs: Both Weakness of Arms/Hands: None  Permission Sought/Granted                  Emotional Assessment Appearance:: Appears stated age Attitude/Demeanor/Rapport: Engaged Affect (typically observed): Accepting Orientation: : Oriented to Self, Oriented to Place, Oriented to  Time, Oriented to Situation   Psych Involvement: No (comment)  Admission diagnosis:  Cellulitis of left leg [L03.116] Cellulitis of left lower extremity [L03.116] Altered mental status, unspecified altered mental status type [R41.82] Sepsis without acute organ dysfunction, due to unspecified organism [facility]) [A41.9] Patient Active Problem List   Diagnosis Date Noted   Chronic diastolic CHF (congestive heart failure) ([place]) 02/02/2022   Skin yeast infection 02/02/2022   Anticoagulation goal of INR 2 to 3 02/02/2022   Renal lesion 02/02/2022   Cellulitis of left leg 02/02/2022   Morbid obesity with BMI of 50.0-59.9, adult ([place]) 12/29/2019   Sepsis ([place]) 12/28/2019   Chronic venous insufficiency 11/02/2019   Venous stasis ulcer of left calf with fat layer exposed with varicose veins ([place]) 06/22/2018   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[place]) 05/19/2018   Rheumatoid arthritis ([place])    Hypertension    Diabetes mellitus without complication ([place])  Long term (current) use of anticoagulants 05/01/2017   Varicose veins of bilateral lower extremities with other complications 56/72/0919   Sepsis due to cellulitis ([place]) 03/13/2017   Hypokalemia 03/13/2017   Cellulitis of left lower extremity    PCP:  Curt Jews, PA-C Pharmacy:   [facility], [place] [address] [place] [address] Phone: [phone] Fax: [phone]  [facility] [address], [place] [address] Phone: [phone] Fax: [phone]     Social Determinants of Health (SDOH) Interventions    Readmission Risk Interventions     No data to display

## 2022-02-04 NOTE — Progress Notes (Signed)
Mobility Specialist Progress Note:   02/04/22 1700  Therapy Vitals  BP (!) 88/70  Mobility  Activity Ambulated with assistance in hallway  Level of Assistance Moderate assist, patient does 50-74% (+2)  Assistive Device Front wheel walker  Distance Ambulated (ft) 70 ft  Activity Response Tolerated poorly  $Mobility charge 1 Mobility   Pt received in bed and agreeable. During ambulation, took 1x standing rest break d/t lower back pain  and then 1x standing break d/t dizziness, fatigue, and SOB. Could not tolerate ambulation back to room, requiring seated rest and assistance back to room. Pt was hypotensive upon return to room and after sitting in chair for approximately 5 minutes. Pt left sitting EOB with all needs met and call bell in reach. RN notified.   Deno Sida Mobility Specialist-Acute Rehab Secure Chat only

## 2022-02-04 NOTE — Progress Notes (Addendum)
PROGRESS NOTE Brittany Tran  QMV:[id number] DOB: 24-Jan-1961 DOA: 02/01/2022 PCP: Curt Jews, PA-C   Brief Narrative/Hospital Course:  61 years old female with past medical history of diabetes mellitus type 2, morbid obesity with BMI 43, chronic venous stasis ulceration worse on LLE, was found by neighbors slumped in her chair and minimally responsive after they did not hear from her in 12 hours. ,EMS was called and  transported to [facility] for evaluation with a concern for UTI and or leg infection and sepsis. In the ED, he was febrile with a temperature of 103.3 and was tachycardic.  Patient was very confused and disoriented initially.  It was then admitted hospital for UTI and left lower extremity cellulitis.  Code sepsis was initiated initially.    Subjective: Today, patient was seen and examined at bedside.  Complains of leg pain and generalized body pain from her history of fibromyalgia.  Denies any fever chills nausea vomiting.  Assessment and Plan: Principal Problem:   Cellulitis of left leg Active Problems:   Sepsis due to cellulitis ([place])   Cellulitis of left lower extremity   Diabetes mellitus without complication (HCC)   Chronic diastolic CHF (congestive heart failure) (HCC)   Anticoagulation goal of INR 2 to 3   Renal lesion   Rheumatoid arthritis (HCC)   Hypertension   Skin yeast infection   Hypokalemia   Sepsis secondary to Left lower extremity cellulitis and UTI POA: History of chronic venous stasis ulceration.  Came with signs of sepsis.  Patient was started on vancomycin and Rocephin.  Blood cultures negative in 3 days.  Urine culture showed some E. coli but insignificant colony.  Has mild leukocytosis at 12.2 and gradually trending down..  Patient has refused Lasix administration.   Exophytic lesion on the anterior aspect of the mid upper right kidney CT abdomen patient with 4.8x3.4x4.1 exophytic lesion anterior aspect mid-upper right kidney. Hemorrhagic cyst vs  tumor.  Will need MRI when patient more stable.  Urinalysis shows RBC 11-20 per high-power field.  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 pharmacy dosing, was therapeutic, INR today at 2.9.  Pharmacy managing.   Chronic diastolic CHF- w/ mild acute component with orthopnea  Extensive lower leg edema: Has shortness of breath and dyspnea on exertion but is sedentary.  Last 2D echocardiogram from 02/2015 with grade 1 diastolic dysfunction.  CTA was limited but no evidence of PE but had some vascular congestion.  Patient initially received Lasix but refused to change yesterday.  She continues to refuse today as well.  Patient is however negative balance for 1490 mL.   Diabetes mellitus type II on long-term insulin:  Latest hemoglobin  A1c 6.4.on long-acting and short-acting insulin.  Semglee 10 units at nighttime.  Latest POC glucose of 95.   Cutaneous yeast infection under panniculus: Continue nystatin powder  Hypertension: Mildly hypotensive today.  Continue to monitor closely.  Continue to hold antihypertensives from home.  Rheumatoid arthritis: Continue prednisone.  Hold methotrexate due to infection.  Acute exacerbation at this time.  Morbid obesity BMI 43 Will benefit from ongoing weight loss as outpatient.  Fibromyalgia, generalized body pain.  Focus on analgesia.  Deconditioning, debility physical therapy has assessed the patient today and recommend home health PT on discharge.  Mild hypokalemia.  We will replace.  Check levels in AM.  DVT prophylaxis: coumadin  Code Status:   Code Status: Full Code  Family Communication:  None at bedside.  Patient status is: Inpatient because of  need for IV antibiotics  Level of care: Telemetry Medical   Dispo: The patient is from: home              Anticipated disposition: PT OT recommends home health on discharge.  Likely home with home health in 1 to 2 days.  Objective:  Vitals last 24 hrs: Vitals:   02/03/22 1649  02/03/22 2107 02/04/22 0620 02/04/22 0853  BP: 118/62 117/64 (!) 94/57 (!) 97/54  Pulse: 78 83 81 (!) 57  Resp: '17 18 18 17  '$ Temp: 98 F (36.7 C) 98.6 F (37 C) 98.9 F (37.2 C) 98.5 F (36.9 C)  TempSrc:  Oral Oral   SpO2: 99% 95% 97% 99%  Weight:      Height:       Physical Examination: General: Obese, not in obvious distress, appears older than stated age, HENT:   No scleral pallor or icterus noted. Oral mucosa is moist.  Chest:    Diminished breath sounds bilaterally. CVS: S1 &S2 heard. No murmur.  Regular rate and rhythm. Abdomen: Soft, nontender, nondistended.  Bowel sounds are heard.   Extremities: No cyanosis, clubbing, bilateral lower extremity edema noted peripheral pulses are palpable.  Bilateral lower extremity chronic venous insufficiency changes, pigmentation noted. Psych: Alert, awake and oriented, normal mood CNS:  No cranial nerve deficits.  Power equal in all extremities.   Skin: Warm and dry.  Left lower extremity with venous ulceration with dressing and cellulitis.  Medications reviewed:  Scheduled Meds:  folic acid  2 mg Oral Daily   furosemide  20 mg Intravenous Daily   gabapentin  300 mg Oral TID   gabapentin  400 mg Oral QHS   insulin aspart  0-20 Units Subcutaneous TID WC   insulin glargine-yfgn  10 Units Subcutaneous QHS   nystatin   Topical BID   predniSONE  5 mg Oral Q breakfast   topiramate  25 mg Oral BID   warfarin  2.5 mg Oral Once per day on Sun Tue Thu   warfarin  5 mg Oral Once per day on Mon Wed Fri Sat   Warfarin - Pharmacist Dosing Inpatient   Does not apply q1600   Continuous Infusions:  cefTRIAXone (ROCEPHIN)  IV 2 g (02/03/22 2231)   vancomycin 750 mg (02/04/22 0919)    Data Reviewed:  I have personally reviewed the following labs and imaging studies.    CBC: Recent Labs  Lab 02/01/22 1612 02/02/22 0352 02/03/22 1027 02/04/22 0440  WBC 29.5* 18.2* 14.0* 12.2*  HGB 13.1 11.2* 12.0 11.8*  HCT 39.5 34.2* 36.3 35.5*  MCV  99.7 102.7* 99.2 99.4  PLT 196 135* 146* 734   Basic Metabolic Panel: Recent Labs  Lab 02/01/22 1612 02/02/22 0352 02/03/22 1027 02/04/22 0440  NA 141 139 139 139  K 3.8 3.5 2.9* 3.4*  CL 109 110 107 113*  CO2 '25 23 25 '$ 21*  GLUCOSE 138* 96 82 75  BUN '14 12 12 13  '$ CREATININE 1.06* 0.83 0.76 0.70  CALCIUM 8.4* 7.8* 7.7* 7.2*   GFR: Estimated Creatinine Clearance: 97.7 mL/min (by C-G formula based on SCr of 0.7 mg/dL). Liver Function Tests: Recent Labs  Lab 02/01/22 1612  AST 27  ALT 11  ALKPHOS 104  BILITOT 1.1  PROT 7.2  ALBUMIN 2.9*   No results for input(s): "LIPASE", "AMYLASE" in the last 168 hours. No results for input(s): "AMMONIA" in the last 168 hours. Coagulation Profile: Recent Labs  Lab 02/01/22 1612 02/02/22 0352 02/03/22  1027 02/04/22 0440  INR 2.9* 2.9* 2.6* 2.9*   BNP (last 3 results) No results for input(s): "PROBNP" in the last 8760 hours. HbA1C: Recent Labs    02/02/22 0025  HGBA1C 6.4*   CBG: Recent Labs  Lab 02/03/22 1144 02/03/22 1644 02/03/22 2107 02/04/22 0729 02/04/22 1126  GLUCAP 114* 102* 98 71 95   Lipid Profile: No results for input(s): "CHOL", "HDL", "LDLCALC", "TRIG", "CHOLHDL", "LDLDIRECT" in the last 72 hours. Thyroid Function Tests: No results for input(s): "TSH", "T4TOTAL", "FREET4", "T3FREE", "THYROIDAB" in the last 72 hours. Sepsis Labs: Recent Labs  Lab 02/01/22 1612 02/01/22 1831  LATICACIDVEN 2.4* 1.8    Recent Results (from the past 240 hour(s))  Culture, blood (Routine x 2)     Status: None (Preliminary result)   Collection Time: 02/01/22  4:03 PM   Specimen: BLOOD  Result Value Ref Range Status   Specimen Description BLOOD LEFT ANTECUBITAL  Final   Special Requests   Final    BOTTLES DRAWN AEROBIC AND ANAEROBIC Blood Culture adequate volume   Culture   Final    NO GROWTH 3 DAYS Performed at [facility], [address], [place], [place] [address]    Report Status PENDING  Incomplete   Culture, blood (Routine x 2)     Status: None (Preliminary result)   Collection Time: 02/01/22  4:11 PM   Specimen: BLOOD  Result Value Ref Range Status   Specimen Description BLOOD RIGHT ANTECUBITAL  Final   Special Requests   Final    BOTTLES DRAWN AEROBIC AND ANAEROBIC Blood Culture results may not be optimal due to an excessive volume of blood received in culture bottles   Culture   Final    NO GROWTH 3 DAYS Performed at [facility], [place] [address] [place], [place] [address]    Report Status PENDING  Incomplete  Urine Culture     Status: Abnormal   Collection Time: 02/01/22  4:21 PM   Specimen: In/Out Cath Urine  Result Value Ref Range Status   Specimen Description IN/OUT CATH URINE  Final   Special Requests   Final    NONE Performed at [facility], [place] [address] [place], [place] [address]    Culture 100 COLONIES/mL ESCHERICHIA COLI (A)  Final   Report Status 02/04/2022 FINAL  Final   Organism ID, Bacteria ESCHERICHIA COLI (A)  Final      Susceptibility   Escherichia coli - MIC*    AMPICILLIN 4 SENSITIVE Sensitive     CEFAZOLIN <=4 SENSITIVE Sensitive     CEFEPIME <=0.12 SENSITIVE Sensitive     CEFTRIAXONE <=0.25 SENSITIVE Sensitive     CIPROFLOXACIN <=0.25 SENSITIVE Sensitive     GENTAMICIN <=1 SENSITIVE Sensitive     IMIPENEM <=0.25 SENSITIVE Sensitive     NITROFURANTOIN 32 SENSITIVE Sensitive     TRIMETH/SULFA <=20 SENSITIVE Sensitive     AMPICILLIN/SULBACTAM <=2 SENSITIVE Sensitive     PIP/TAZO <=4 SENSITIVE Sensitive     * 100 COLONIES/mL ESCHERICHIA COLI    Antimicrobials: Anti-infectives (From admission, onward)    Start     Dose/Rate Route Frequency Ordered Stop   02/03/22 1700  cefTRIAXone (ROCEPHIN) 2 g in sodium chloride 0.9 % 100 mL IVPB        2 g 200 mL/hr over 30 Minutes Intravenous Every 24 hours 02/03/22 1018     02/02/22 1700  cefTRIAXone (ROCEPHIN) 1 g in sodium chloride 0.9 % 100 mL IVPB  Status:  Discontinued        1 g 200  mL/hr over 30 Minutes Intravenous Every 24 hours 02/02/22 0154 02/03/22 1018   02/02/22 1100  vancomycin (VANCOREADY) IVPB 750 mg/150 mL        750 mg 75 mL/hr over 120 Minutes Intravenous Every 12 hours 02/01/22 2229     02/01/22 2145  vancomycin (VANCOREADY) IVPB 1750 mg/350 mL        1,750 mg 116.7 mL/hr over 180 Minutes Intravenous  Once 02/01/22 2143 02/02/22 0054   02/01/22 1645  cefTRIAXone (ROCEPHIN) 2 g in sodium chloride 0.9 % 100 mL IVPB        2 g 200 mL/hr over 30 Minutes Intravenous  Once 02/01/22 1633 02/01/22 1843      Radiology Studies: No results found.   LOS: 2 days   Flora Lipps, MD [facility] 02/04/2022, 11:52 AM

## 2022-02-04 NOTE — Evaluation (Signed)
Occupational Therapy Evaluation Patient Details Name: Brittany Tran MRN: [id number] DOB: 09-10-1960 Today's Date: 02/04/2022   History of Present Illness Patient is a 61 y/o female who presents on 8/18 for generalized weakness and found slumped in her chair minimally responsive by her neighbors. Found to have UTI and sepsis secondary to LLE cellulitis. PMH includes DM, HF, HTN, DVT, fibromyalgia, RA.   Clinical Impression   Pt was ambulating with a SPC, standing to shower and using elevated toilet. She works and drives. She reports it takes 4 hours for her to get ready and to drive to work each morning. She struggles to get compression socks on and off. 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 Pt with diffuse pain, not on her RA medication. She requires max assist for sit to stand from elevated bed, but does well once on her feet. Pt could benefit from [facility] re[facility], but is refusing. Pt may mobilize better once she is back on her home medications, but she was struggling to care for herself PTA. Will follow to educate in DME and AE as well as energy conservation. Recommending HHOT.      Recommendations for follow up therapy are one component of a multi-disciplinary discharge planning process, led by the attending physician.  Recommendations may be updated based on patient status, additional functional criteria and insurance authorization.   Follow Up Recommendations  Home health OT    Assistance Recommended at Discharge Frequent or constant Supervision/Assistance  Patient can return home with the following A lot of help with walking and/or transfers;A lot of help with bathing/dressing/bathroom;Assistance with cooking/housework;Assist for transportation;Help with stairs or ramp for entrance    Functional Status Assessment  Patient has had a recent decline in their functional status and demonstrates the ability to make significant improvements in  function in a reasonable and predictable amount of time.  Equipment Recommendations  Tub/shower bench    Recommendations for Other Services       Precautions / Restrictions Precautions Precautions: Fall Restrictions Weight Bearing Restrictions: No      Mobility Bed Mobility Overal bed mobility: Needs Assistance Bed Mobility: Supine to Sit, Sit to Supine     Supine to sit: Mod assist, HOB elevated Sit to supine: Mod assist   General bed mobility comments: increased time, assist to raise trunk and for hips to EOB, assist for LEs back into bed    Transfers Overall transfer level: Needs assistance Equipment used: Straight cane Transfers: Sit to/from Stand Sit to Stand: Max assist, From elevated surface           General transfer comment: increased time and effort, use of momentum      Balance Overall balance assessment: Needs assistance   Sitting balance-Leahy Scale: Fair     Standing balance support: During functional activity, Single extremity supported, Bilateral upper extremity supported Standing balance-Leahy Scale: Poor                             ADL either performed or assessed with clinical judgement   ADL Overall ADL's : Needs assistance/impaired Eating/Feeding: Independent;Bed level   Grooming: Set up;Sitting   Upper Body Bathing: Minimal assistance;Sitting   Lower Body Bathing: Maximal assistance;Sit to/from stand   Upper Body Dressing : Minimal assistance;Sitting   Lower Body Dressing: Maximal assistance;Sit to/from stand       Toileting- Water quality scientist and Hygiene: Total assistance;Sit to/from  stand       Functional mobility during ADLs: Minimal assistance;Rolling walker (2 wheels)       Vision Baseline Vision/History: 1 Wears glasses Ability to See in Adequate Light: 0 Adequate Patient Visual Report: No change from baseline       Perception     Praxis      Pertinent Vitals/Pain Pain Assessment Pain  Assessment: Faces Faces Pain Scale: Hurts little more Pain Location: BLEs, bil shoulders Pain Descriptors / Indicators: Sore, Constant, Discomfort Pain Intervention(s): Monitored during session     Hand Dominance Right   Extremity/Trunk Assessment Upper Extremity Assessment Upper Extremity Assessment: Generalized weakness (painful shoulders)   Lower Extremity Assessment Lower Extremity Assessment: Defer to PT evaluation   Cervical / Trunk Assessment Cervical / Trunk Assessment: Other exceptions (obesity)   Communication Communication Communication: No difficulties   Cognition Arousal/Alertness: Awake/alert Behavior During Therapy: WFL for tasks assessed/performed Overall Cognitive Status: No family/caregiver present to determine baseline cognitive functioning Area of Impairment: Safety/judgement                         Safety/Judgement: Decreased awareness of safety, Decreased awareness of deficits           General Comments       Exercises     Shoulder Instructions      Home Living Family/patient expects to be discharged to:: Private residence Living Arrangements: Alone Available Help at Discharge: Neighbor;Available PRN/intermittently;Friend(s) Type of Home: Other(Comment) (condo) Home Access: Stairs to enter Entrance Stairs-Number of Steps: 15 Entrance Stairs-Rails: Right Home Layout: One level     Bathroom Shower/Tub: Teacher, early years/pre: Handicapped height     Home Equipment: Cane - single point;Toilet riser          Prior Functioning/Environment Prior Level of Function : Independent/Modified Independent;Driving;Working/employed             Mobility Comments: Uses SPC for ambulation, works in Therapist, art, goes to office 3 days/week. Drives. Sleeps in lift chair at home. ADLs Comments: uses her cane to start panties over feet, wears dresses, donning compression socks exhausts her, pt works full time        OT  Problem List: Decreased activity tolerance;Impaired balance (sitting and/or standing);Decreased strength;Decreased knowledge of use of DME or AE;Obesity;Pain      OT Treatment/Interventions: Self-care/ADL training;DME and/or AE instruction;Therapeutic activities;Patient/family education;Balance training    OT Goals(Current goals can be found in the care plan section) Acute Rehab OT Goals OT Goal Formulation: With patient Time For Goal Achievement: 02/18/22 Potential to Achieve Goals: Good ADL Goals Pt Will Perform Grooming: with modified independence;standing Pt Will Perform Lower Body Bathing: with modified independence;with adaptive equipment;sit to/from stand Pt Will Perform Lower Body Dressing: with modified independence;sit to/from stand;with adaptive equipment Pt Will Transfer to Toilet: with modified independence;ambulating;bedside commode Pt Will Perform Toileting - Clothing Manipulation and hygiene: with modified independence;with adaptive equipment;sit to/from stand Additional ADL Goal #1: Pt will employ energy conservation strategies in ADLs and mobility as instructed.  OT Frequency: Min 2X/week    Co-evaluation              AM-PAC OT "6 Clicks" Daily Activity     Outcome Measure Help from another person eating meals?: None Help from another person taking care of personal grooming?: A Lot Help from another person toileting, which includes using toliet, bedpan, or urinal?: A Lot Help from another person bathing (including washing, rinsing, drying)?: A Lot  Help from another person to put on and taking off regular upper body clothing?: A Little Help from another person to put on and taking off regular lower body clothing?: A Lot 6 Click Score: 15   End of Session Equipment Utilized During Treatment: Other (comment) (cane)  Activity Tolerance: Patient limited by fatigue;Patient limited by pain Patient left: in bed;with call bell/phone within reach;with bed alarm set  OT  Visit Diagnosis: Unsteadiness on feet (R26.81);Other abnormalities of gait and mobility (R26.89);Pain;Muscle weakness (generalized) (M62.81)                Time: 7341-9379 OT Time Calculation (min): 32 min Charges:  OT General Charges $OT Visit: 1 Visit OT Evaluation $OT Eval Moderate Complexity: 1 Mod OT Treatments $Self Care/Home Management : 8-22 mins Cleta Alberts, OTR/L Acute Rehabilitation Services Office: [phone]   Malka So 02/04/2022, 4:29 PM

## 2022-02-05 ENCOUNTER — Inpatient Hospital Stay (HOSPITAL_COMMUNITY): Payer: BC Managed Care – PPO

## 2022-02-05 DIAGNOSIS — Z5181 Encounter for therapeutic drug level monitoring: Secondary | ICD-10-CM | POA: Diagnosis not present

## 2022-02-05 DIAGNOSIS — L03116 Cellulitis of left lower limb: Secondary | ICD-10-CM | POA: Diagnosis not present

## 2022-02-05 DIAGNOSIS — I5032 Chronic diastolic (congestive) heart failure: Secondary | ICD-10-CM | POA: Diagnosis not present

## 2022-02-05 DIAGNOSIS — A419 Sepsis, unspecified organism: Secondary | ICD-10-CM | POA: Diagnosis not present

## 2022-02-05 LAB — CBC
HCT: 34.8 % — ABNORMAL LOW (ref 36.0–46.0)
Hemoglobin: 11.9 g/dL — ABNORMAL LOW (ref 12.0–15.0)
MCH: 33.6 pg (ref 26.0–34.0)
MCHC: 34.2 g/dL (ref 30.0–36.0)
MCV: 98.3 fL (ref 80.0–100.0)
Platelets: 171 10*3/uL (ref 150–400)
RBC: 3.54 MIL/uL — ABNORMAL LOW (ref 3.87–5.11)
RDW: 15.6 % — ABNORMAL HIGH (ref 11.5–15.5)
WBC: 14.9 10*3/uL — ABNORMAL HIGH (ref 4.0–10.5)
nRBC: 0 % (ref 0.0–0.2)

## 2022-02-05 LAB — GLUCOSE, CAPILLARY
Glucose-Capillary: 71 mg/dL (ref 70–99)
Glucose-Capillary: 79 mg/dL (ref 70–99)
Glucose-Capillary: 52 mg/dL — ABNORMAL LOW (ref 70–99)
Glucose-Capillary: 70 mg/dL (ref 70–99)
Glucose-Capillary: 117 mg/dL — ABNORMAL HIGH (ref 70–99)
Glucose-Capillary: 146 mg/dL — ABNORMAL HIGH (ref 70–99)

## 2022-02-05 LAB — BASIC METABOLIC PANEL
Anion gap: 8 (ref 5–15)
BUN: 10 mg/dL (ref 8–23)
CO2: 23 mmol/L (ref 22–32)
Calcium: 7.5 mg/dL — ABNORMAL LOW (ref 8.9–10.3)
Chloride: 109 mmol/L (ref 98–111)
Creatinine, Ser: 0.82 mg/dL (ref 0.44–1.00)
GFR, Estimated: >60 mL/min (ref >60)
Glucose, Bld: 79 mg/dL (ref 70–99)
Potassium: 3.4 mmol/L — ABNORMAL LOW (ref 3.5–5.1)
Sodium: 140 mmol/L (ref 135–145)

## 2022-02-05 LAB — MAGNESIUM: Magnesium: 1.9 mg/dL (ref 1.7–2.4)

## 2022-02-05 MED ORDER — TRAMADOL HCL 50 MG PO TABS
50.0000 mg | ORAL_TABLET | Freq: Four times a day (QID) | ORAL | Status: DC | PRN
  Administered 2022-02-06: 50 mg via ORAL
  Filled 2022-02-05 (×2): qty 1

## 2022-02-05 MED ORDER — GADOBUTROL 1 MMOL/ML IV SOLN
10.0000 mL | Freq: Once | INTRAVENOUS | Status: AC | PRN
  Administered 2022-02-05: 10 mL via INTRAVENOUS

## 2022-02-05 MED ORDER — POTASSIUM CHLORIDE CRYS ER 20 MEQ PO TBCR
40.0000 meq | EXTENDED_RELEASE_TABLET | Freq: Once | ORAL | Status: AC
  Administered 2022-02-05: 40 meq via ORAL
  Filled 2022-02-05: qty 2

## 2022-02-05 NOTE — Progress Notes (Signed)
Mobility Specialist Criteria Algorithm Info.   02/05/22 1407  Mobility  Activity Transferred from bed to chair  Range of Motion/Exercises Active;Passive  Level of Assistance +2 (takes two people)  Assistive Device Other (Comment) (HHA)  Activity Response Tolerated fair;RN notified   Patient received in supine reluctant to participate but eventually agreed with max encouragement. Presented with increased lethargy, swelling, redness (LE/UE's, chest, etc) headache and pain. Patient stated she didn't feel well, was hot to touch but stated she felt cold. Required max A to EOB from supine>sit and mod A +2 sit>stand. Was able to take minimal steps alongside bed to recliner chair. Tolerated without incident. Was left in recliner with all needs met, call bell in reach.   [place] Tynia Wiers, [place], [place]  GOTLX:[phone] Office: [phone]

## 2022-02-05 NOTE — Progress Notes (Signed)
PROGRESS NOTE Brittany Tran  MHD:[id number] DOB: Nov 28, 1960 DOA: 02/01/2022 PCP: Curt Jews, PA-C   Brief Narrative/Hospital Course:  61 years old female with past medical history of diabetes mellitus type 2, morbid obesity with BMI 43, chronic venous stasis ulceration worse on LLE, was found by neighbors slumped in her chair and minimally responsive after they did not hear from her in 12 hours. EMS was called and  transported to [facility] for evaluation with a concern for UTI and or leg infection and sepsis. In the ED, he was febrile with a temperature of 103.3 F and was tachycardic.  Patient was very confused and disoriented initially.  Patient was then admitted to the hospital for UTI and left lower extremity cellulitis.  Code sepsis was initiated initially.   During hospitalization, CT scan of the abdomen showed possible exophytic mass of the right kidney.  Patient continued to have leg pain and generalized body pain.  MRI was recommended   Subjective:  Today, patient was seen and examined at bedside.  Seen after MRI scan.  Was slightly hypoglycemic but denies any dizziness diaphoresis.  Complains of generalized body pain from her history of fibromyalgia including leg pain.  Feels weak and fatigued.   Assessment and Plan: Principal Problem:   Cellulitis of left leg Active Problems:   Sepsis due to cellulitis ([place])   Cellulitis of left lower extremity   Diabetes mellitus without complication (HCC)   Chronic diastolic CHF (congestive heart failure) (HCC)   Anticoagulation goal of INR 2 to 3   Renal lesion   Rheumatoid arthritis (HCC)   Hypertension   Skin yeast infection   Hypokalemia   Sepsis secondary to Left lower extremity cellulitis and UTI, POA: History of chronic venous stasis ulceration.  Presented with signs of sepsis.  Currently on vancomycin and Rocephin.  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  Administering with Benadryl and slow infusion.  Blood cultures negative  in 4 days.  Urine culture showed some E. coli but insignificant colony.  Has mild leukocytosis.  Patient has refused Lasix administration.   Exophytic lesion on the anterior aspect of the mid upper right kidney CT abdomen patient with 4.8x3.4x4.1 centimeter exophytic lesion anterior aspect mid-upper right kidney.  Differential diagnosis was hemorrhagic cyst vs tumor.  Urinalysis shows RBC 11-20 per high-power field.  Will undergo MRI of the abdomen with and without contrast 02/05/2022.  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 pharmacy dosing,   Chronic diastolic CHF- w/ mild acute component with orthopnea  Extensive lower leg edema: Has shortness of breath and dyspnea on exertion but is sedentary.  Last 2D echocardiogram from 02/2015 with grade 1 diastolic dysfunction.  CTA was limited but no evidence of PE but had some vascular congestion.  Has refused Lasix.  Mildly positive balance for 749 mL.  Continue IV Lasix.   Diabetes mellitus type II on long-term insulin:  Latest hemoglobin  A1c 6.4.currently on long-acting and short-acting insulin.  Semglee 10 units at nighttime.  Mildly hypoglycemic this morning due to n.p.o. status.  We will continue to monitor.   Cutaneous yeast infection under panniculus: Continue nystatin powder  Hypertension: Still borderline hypotensive.  Continue to hold antihypertensive  Rheumatoid arthritis: Continue prednisone.  Hold methotrexate due to infection.  No acute exacerbation at this time.  Morbid obesity BMI 43 Will benefit from ongoing weight loss as outpatient.  Fibromyalgia, generalized body pain.  Focus on analgesia.  Deconditioning, debility physical therapy recommends home health  PT on discharge.  Mild hypokalemia.  We will continue to replace orally.  Check levels in AM.  DVT prophylaxis: coumadin  Code Status:   Code Status: Full Code  Family Communication:  None at bedside.  Patient status is: Inpatient because of need for IV  antibiotics Debility, weakness, pending clinical improvement  Level of care: Telemetry Medical   Dispo: The patient is from: home   Anticipated disposition: PT OT recommends home health on discharge.  Likely home with home health in 1 to 2 days.  Objective:  Vitals last 24 hrs: Vitals:   02/04/22 0853 02/04/22 1700 02/04/22 2056 02/05/22 0450  BP: (!) 97/54 (!) 88/70 105/61 98/61  Pulse: (!) 57 62 88 85  Resp: '17 18 18 18  '$ Temp: 98.5 F (36.9 C) 98.6 F (37 C) 98.9 F (37.2 C) 99.7 F (37.6 C)  TempSrc:    Oral  SpO2: 99% 98% 98% 97%  Weight:      Height:       Physical Examination:  General: Obese built, not in obvious distress, appears older than stated age, appears fatigued and weak, HENT:   No scleral pallor or icterus noted. Oral mucosa is moist.  Chest:  Diminished breath sounds bilaterally. No crackles or wheezes.  CVS: S1 &S2 heard. No murmur.  Regular rate and rhythm. Abdomen: Soft, nontender, nondistended.  Bowel sounds are heard.   Extremities: No cyanosis, clubbing with bilateral lower extremity edema, chronic venous insufficiency changes, pigmentation, dressing over the left lower extremity with venous ulceration and cellulitis peripheral pulses are palpable. Psych: Alert, awake and oriented, normal mood CNS:  No cranial nerve deficits.  Power equal in all extremities.   Skin: Warm and dry.  Left lower extremity with venous ulceration and cellulitis covered with dressing.   Medications reviewed:  Scheduled Meds:  folic acid  2 mg Oral Daily   furosemide  20 mg Intravenous Daily   gabapentin  300 mg Oral TID   gabapentin  400 mg Oral QHS   insulin aspart  0-20 Units Subcutaneous TID WC   insulin glargine-yfgn  10 Units Subcutaneous QHS   nystatin   Topical BID   predniSONE  5 mg Oral Q breakfast   topiramate  25 mg Oral BID   warfarin  2.5 mg Oral Once per day on Sun Tue Thu   warfarin  5 mg Oral Once per day on Mon Wed Fri Sat   Warfarin - Pharmacist  Dosing Inpatient   Does not apply q1600   Continuous Infusions:  cefTRIAXone (ROCEPHIN)  IV 2 g (02/04/22 1837)   vancomycin 750 mg (02/05/22 1150)    Data Reviewed:  I have personally reviewed the following labs and imaging studies.    CBC: Recent Labs  Lab 02/01/22 1612 02/02/22 0352 02/03/22 1027 02/04/22 0440 02/05/22 0500  WBC 29.5* 18.2* 14.0* 12.2* 14.9*  HGB 13.1 11.2* 12.0 11.8* 11.9*  HCT 39.5 34.2* 36.3 35.5* 34.8*  MCV 99.7 102.7* 99.2 99.4 98.3  PLT 196 135* 146* 153 409    Basic Metabolic Panel: Recent Labs  Lab 02/01/22 1612 02/02/22 0352 02/03/22 1027 02/04/22 0440 02/05/22 0500  NA 141 139 139 139 140  K 3.8 3.5 2.9* 3.4* 3.4*  CL 109 110 107 113* 109  CO2 '25 23 25 '$ 21* 23  GLUCOSE 138* 96 82 75 79  BUN '14 12 12 13 10  '$ CREATININE 1.06* 0.83 0.76 0.70 0.82  CALCIUM 8.4* 7.8* 7.7* 7.2* 7.5*  MG  --   --   --   --  1.9    GFR: Estimated Creatinine Clearance: 95.3 mL/min (by C-G formula based on SCr of 0.82 mg/dL). Liver Function Tests: Recent Labs  Lab 02/01/22 1612  AST 27  ALT 11  ALKPHOS 104  BILITOT 1.1  PROT 7.2  ALBUMIN 2.9*    No results for input(s): "LIPASE", "AMYLASE" in the last 168 hours. No results for input(s): "AMMONIA" in the last 168 hours. Coagulation Profile: Recent Labs  Lab 02/01/22 1612 02/02/22 0352 02/03/22 1027 02/04/22 0440  INR 2.9* 2.9* 2.6* 2.9*    BNP (last 3 results) No results for input(s): "PROBNP" in the last 8760 hours. HbA1C: No results for input(s): "HGBA1C" in the last 72 hours.  CBG: Recent Labs  Lab 02/04/22 1126 02/04/22 1701 02/04/22 2055 02/05/22 0717 02/05/22 0852  GLUCAP 95 103* 122* 71 79    Lipid Profile: No results for input(s): "CHOL", "HDL", "LDLCALC", "TRIG", "CHOLHDL", "LDLDIRECT" in the last 72 hours. Thyroid Function Tests: No results for input(s): "TSH", "T4TOTAL", "FREET4", "T3FREE", "THYROIDAB" in the last 72 hours. Sepsis Labs: Recent Labs  Lab  02/01/22 1612 02/01/22 1831  LATICACIDVEN 2.4* 1.8     Recent Results (from the past 240 hour(s))  Culture, blood (Routine x 2)     Status: None (Preliminary result)   Collection Time: 02/01/22  4:03 PM   Specimen: BLOOD  Result Value Ref Range Status   Specimen Description BLOOD LEFT ANTECUBITAL  Final   Special Requests   Final    BOTTLES DRAWN AEROBIC AND ANAEROBIC Blood Culture adequate volume   Culture   Final    NO GROWTH 4 DAYS Performed at [facility], [place] [address], [place], [place] [address]    Report Status PENDING  Incomplete  Culture, blood (Routine x 2)     Status: None (Preliminary result)   Collection Time: 02/01/22  4:11 PM   Specimen: BLOOD  Result Value Ref Range Status   Specimen Description BLOOD RIGHT ANTECUBITAL  Final   Special Requests   Final    BOTTLES DRAWN AEROBIC AND ANAEROBIC Blood Culture results may not be optimal due to an excessive volume of blood received in culture bottles   Culture   Final    NO GROWTH 4 DAYS Performed at [facility], [place] [address] [place], [place] [address]    Report Status PENDING  Incomplete  Urine Culture     Status: Abnormal   Collection Time: 02/01/22  4:21 PM   Specimen: In/Out Cath Urine  Result Value Ref Range Status   Specimen Description IN/OUT CATH URINE  Final   Special Requests   Final    NONE Performed at [facility], [place] [address], [place], [place] [address]    Culture 100 COLONIES/mL ESCHERICHIA COLI (A)  Final   Report Status 02/04/2022 FINAL  Final   Organism ID, Bacteria ESCHERICHIA COLI (A)  Final      Susceptibility   Escherichia coli - MIC*    AMPICILLIN 4 SENSITIVE Sensitive     CEFAZOLIN <=4 SENSITIVE Sensitive     CEFEPIME <=0.12 SENSITIVE Sensitive     CEFTRIAXONE <=0.25 SENSITIVE Sensitive     CIPROFLOXACIN <=0.25 SENSITIVE Sensitive     GENTAMICIN <=1 SENSITIVE Sensitive     IMIPENEM <=0.25 SENSITIVE Sensitive     NITROFURANTOIN 32 SENSITIVE Sensitive      TRIMETH/SULFA <=20 SENSITIVE Sensitive     AMPICILLIN/SULBACTAM <=2 SENSITIVE Sensitive     PIP/TAZO <=4 SENSITIVE Sensitive     *  100 COLONIES/mL ESCHERICHIA COLI    Antimicrobials: Anti-infectives (From admission, onward)    Start     Dose/Rate Route Frequency Ordered Stop   02/03/22 1700  cefTRIAXone (ROCEPHIN) 2 g in sodium chloride 0.9 % 100 mL IVPB        2 g 200 mL/hr over 30 Minutes Intravenous Every 24 hours 02/03/22 1018     02/02/22 1700  cefTRIAXone (ROCEPHIN) 1 g in sodium chloride 0.9 % 100 mL IVPB  Status:  Discontinued        1 g 200 mL/hr over 30 Minutes Intravenous Every 24 hours 02/02/22 0154 02/03/22 1018   02/02/22 1100  vancomycin (VANCOREADY) IVPB 750 mg/150 mL        750 mg 75 mL/hr over 120 Minutes Intravenous Every 12 hours 02/01/22 2229     02/01/22 2145  vancomycin (VANCOREADY) IVPB 1750 mg/350 mL        1,750 mg 116.7 mL/hr over 180 Minutes Intravenous  Once 02/01/22 2143 02/02/22 0054   02/01/22 1645  cefTRIAXone (ROCEPHIN) 2 g in sodium chloride 0.9 % 100 mL IVPB        2 g 200 mL/hr over 30 Minutes Intravenous  Once 02/01/22 1633 02/01/22 1843      Radiology Studies: No results found.   LOS: 3 days   Flora Lipps, MD [facility] 02/05/2022, 2:00 PM

## 2022-02-05 NOTE — Progress Notes (Signed)
Physical Therapy Treatment Patient Details Name: Brittany Tran MRN: [id number] DOB: 12/11/1960 Today's Date: 02/05/2022   History of Present Illness Patient is a 61 y/o female who presents on 8/18 for generalized weakness and found slumped in her chair minimally responsive by her neighbors. Found to have UTI and sepsis secondary to LLE cellulitis. PMH includes DM, HF, HTN, DVT, fibromyalgia, RA.    PT Comments    Pt limited in participation by 10/10 pain in her LE. Pt able to perform minimal LE exercises in recliner before requesting to stop due to increased pain. Pt reports wanting to get back to bed and IV team reports pt needs to be in bed for IV placement. Pt requires maxA for transfers and modA for bed mobility. Educated pt on current level of function, reluctantly agreeable to SNF level rehab before going home. PT will continue to follow acutely.    Recommendations for follow up therapy are one component of a multi-disciplinary discharge planning process, led by the attending physician.  Recommendations may be updated based on patient status, additional functional criteria and insurance authorization.  Follow Up Recommendations  Skilled nursing-short term rehab (<3 hours/day)     Assistance Recommended at Discharge Frequent or constant Supervision/Assistance  Patient can return home with the following A lot of help with walking and/or transfers;Assistance with cooking/housework;Assist for transportation;Help with stairs or ramp for entrance;A little help with bathing/dressing/bathroom   Equipment Recommendations  Rolling walker (2 wheels)    Recommendations for Other Services       Precautions / Restrictions Precautions Precautions: Fall Restrictions Weight Bearing Restrictions: No     Mobility  Bed Mobility Overal bed mobility: Needs Assistance Bed Mobility: Rolling, Sidelying to Sit Rolling: Min guard       Sit to sidelying: Mod assist General bed mobility  comments: maximal cuing for not flopping back on her back and staying in sidelying while PT assists with bringing LE back into bed,    Transfers Overall transfer level: Needs assistance Equipment used: Rolling walker (2 wheels) Transfers: Sit to/from Stand, Bed to chair/wheelchair/BSC Sit to Stand: Max assist           General transfer comment: increased time and effort, use of momentum to come to standing at recliner, requires modA for steadying while RW can be moved in front of her, min A for stepping to recliner          Balance Overall balance assessment: Needs assistance Sitting-balance support: Feet supported, No upper extremity supported Sitting balance-Leahy Scale: Fair Sitting balance - Comments: Prefers UE support but able to sit without it   Standing balance support: During functional activity, Single extremity supported, Bilateral upper extremity supported Standing balance-Leahy Scale: Poor Standing balance comment: requires Both handson RW and outside support to maintain standing                            Cognition Arousal/Alertness: Awake/alert Behavior During Therapy: WFL for tasks assessed/performed Overall Cognitive Status: Impaired/Different from baseline Area of Impairment: Safety/judgement                         Safety/Judgement: Decreased awareness of safety, Decreased awareness of deficits     General Comments: Slow processing, flat affect. Poor understanding of current deficits and their impact on function and living alone.        Exercises General Exercises - Lower Extremity Ankle Circles/Pumps: AROM, Both, 10  reps, Seated Heel Slides: AAROM, Both, 10 reps, Seated    General Comments General comments (skin integrity, edema, etc.): pt with increased rubor on LE,  back and UE, pt reports being cold despite skin being warm to the touch, per pt she does not have a fever, mother and father in room      Pertinent  Vitals/Pain Pain Assessment Pain Assessment: 0-10 Pain Score: 10-Worst pain ever Pain Location: BLEs, bil shoulders Pain Descriptors / Indicators: Sore, Constant, Discomfort Pain Intervention(s): Limited activity within patient's tolerance, Monitored during session, Repositioned     PT Goals (current goals can now be found in the care plan section) Acute Rehab PT Goals Patient Stated Goal: to go home PT Goal Formulation: With patient Time For Goal Achievement: 02/18/22 Potential to Achieve Goals: Fair Progress towards PT goals: Not progressing toward goals - comment    Frequency    Min 3X/week      PT Plan Discharge plan needs to be updated       AM-PAC PT "6 Clicks" Mobility   Outcome Measure  Help needed turning from your back to your side while in a flat bed without using bedrails?: A Little Help needed moving from lying on your back to sitting on the side of a flat bed without using bedrails?: A Lot Help needed moving to and from a bed to a chair (including a wheelchair)?: A Lot Help needed standing up from a chair using your arms (e.g., wheelchair or bedside chair)?: A Lot Help needed to walk in hospital room?: Total Help needed climbing 3-5 steps with a railing? : A Lot 6 Click Score: 12    End of Session Equipment Utilized During Treatment: Gait belt Activity Tolerance: Patient limited by pain;Patient limited by fatigue Patient left: in bed;with call bell/phone within reach;with bed alarm set Nurse Communication: Mobility status PT Visit Diagnosis: Pain;Muscle weakness (generalized) (M62.81);Difficulty in walking, not elsewhere classified (R26.2) Pain - Right/Left:  (bil) Pain - part of body: Leg     Time: 1523-1550 PT Time Calculation (min) (ACUTE ONLY): 27 min  Charges:  $Therapeutic Exercise: 8-22 mins $Therapeutic Activity: 8-22 mins                     Arthuro Canelo B. Migdalia Dk PT, DPT Acute Rehabilitation Services Please use secure chat or  Call  Office [phone]    [place] 02/05/2022, 4:08 PM

## 2022-02-06 ENCOUNTER — Encounter: Payer: BC Managed Care – PPO | Admitting: Registered Nurse

## 2022-02-06 DIAGNOSIS — A419 Sepsis, unspecified organism: Secondary | ICD-10-CM | POA: Diagnosis not present

## 2022-02-06 DIAGNOSIS — L03116 Cellulitis of left lower limb: Secondary | ICD-10-CM | POA: Diagnosis not present

## 2022-02-06 DIAGNOSIS — I5032 Chronic diastolic (congestive) heart failure: Secondary | ICD-10-CM | POA: Diagnosis not present

## 2022-02-06 DIAGNOSIS — Z5181 Encounter for therapeutic drug level monitoring: Secondary | ICD-10-CM | POA: Diagnosis not present

## 2022-02-06 LAB — BASIC METABOLIC PANEL
Anion gap: 9 (ref 5–15)
BUN: 11 mg/dL (ref 8–23)
CO2: 22 mmol/L (ref 22–32)
Calcium: 7.4 mg/dL — ABNORMAL LOW (ref 8.9–10.3)
Chloride: 106 mmol/L (ref 98–111)
Creatinine, Ser: 0.81 mg/dL (ref 0.44–1.00)
GFR, Estimated: >60 mL/min (ref >60)
Glucose, Bld: 54 mg/dL — ABNORMAL LOW (ref 70–99)
Potassium: 3.4 mmol/L — ABNORMAL LOW (ref 3.5–5.1)
Sodium: 137 mmol/L (ref 135–145)

## 2022-02-06 LAB — PROTIME-INR
INR: 3.8 — ABNORMAL HIGH (ref 0.8–1.2)
Prothrombin Time: 36.9 seconds — ABNORMAL HIGH (ref 11.4–15.2)

## 2022-02-06 LAB — GLUCOSE, CAPILLARY
Glucose-Capillary: 54 mg/dL — ABNORMAL LOW (ref 70–99)
Glucose-Capillary: 87 mg/dL (ref 70–99)
Glucose-Capillary: 124 mg/dL — ABNORMAL HIGH (ref 70–99)
Glucose-Capillary: 118 mg/dL — ABNORMAL HIGH (ref 70–99)
Glucose-Capillary: 99 mg/dL (ref 70–99)

## 2022-02-06 LAB — CBC
HCT: 36.1 % (ref 36.0–46.0)
Hemoglobin: 11.9 g/dL — ABNORMAL LOW (ref 12.0–15.0)
MCH: 32.7 pg (ref 26.0–34.0)
MCHC: 33 g/dL (ref 30.0–36.0)
MCV: 99.2 fL (ref 80.0–100.0)
Platelets: 186 10*3/uL (ref 150–400)
RBC: 3.64 MIL/uL — ABNORMAL LOW (ref 3.87–5.11)
RDW: 15.6 % — ABNORMAL HIGH (ref 11.5–15.5)
WBC: 18.6 10*3/uL — ABNORMAL HIGH (ref 4.0–10.5)
nRBC: 0 % (ref 0.0–0.2)

## 2022-02-06 LAB — CULTURE, BLOOD (ROUTINE X 2)
Culture: NO GROWTH
Culture: NO GROWTH
Special Requests: ADEQUATE

## 2022-02-06 MED ORDER — POTASSIUM CHLORIDE CRYS ER 20 MEQ PO TBCR
40.0000 meq | EXTENDED_RELEASE_TABLET | Freq: Every day | ORAL | Status: DC
  Administered 2022-02-06 – 2022-02-09 (×4): 40 meq via ORAL
  Filled 2022-02-06 (×4): qty 2

## 2022-02-06 MED ORDER — POTASSIUM CHLORIDE CRYS ER 20 MEQ PO TBCR: 40.0000 meq | EXTENDED_RELEASE_TABLET | Freq: Once | ORAL | Status: DC

## 2022-02-06 MED ORDER — OXYCODONE-ACETAMINOPHEN 5-325 MG PO TABS
1.0000 | ORAL_TABLET | Freq: Four times a day (QID) | ORAL | Status: DC | PRN
  Administered 2022-02-06 – 2022-02-13 (×6): 2 via ORAL
  Filled 2022-02-06 (×6): qty 2

## 2022-02-06 NOTE — Progress Notes (Signed)
Hypoglycemic Event  CBG: 54  Treatment: 4 oz juice/soda ate breakfast tray  Symptoms: None  Follow-up CBG: [id number] CBG Result:71  Possible Reasons for Event: Inadequate meal intake  Comments/MD notified:hypoglycemic protocol followed     Brittany Tran

## 2022-02-06 NOTE — Progress Notes (Signed)
[place] for warfarin Indication: Hx of DVT  Allergies  Allergen Reactions   Penicillins Hives    Has patient had a PCN reaction causing immediate rash, facial/tongue/throat swelling, SOB or lightheadedness with hypotension: Yes Has patient had a PCN reaction causing severe rash involving mucus membranes or skin necrosis: No Has patient had a PCN reaction that required hospitalization: No Has patient had a PCN reaction occurring within the last 10 years: No If all of the above answers are "NO", then may proceed with Cephalosporin use.    Vancomycin Rash    Developed a red rash bilaterally on her arms, legs, neck, chest and abdomen   Penicillin G Benzathine Rash   Sulfa Antibiotics Hives and Rash    Other reaction(s): Other (See Comments) Other reaction(s): Unknown      Labs: Recent Labs    02/03/22 1027 02/04/22 0440 02/05/22 0500 02/06/22 0533  HGB 12.0 11.8* 11.9* 11.9*  HCT 36.3 35.5* 34.8* 36.1  PLT 146* 153 171 186  LABPROT 27.5* 30.4*  --  36.9*  INR 2.6* 2.9*  --  3.8*  CREATININE 0.76 0.70 0.82  --      Estimated Creatinine Clearance: 95.3 mL/min (by C-G formula based on SCr of 0.82 mg/dL). Assessment: 9 yoF minimally responsive suspected to have UTI and or leg infection. Patient is on warfarin prior to admission last dose 8/17. HgB/PLT WNL  PTA warfarin dose:  Take 1 tablet ('5mg'$ ) Monday, Wednesday, Friday and Saturday and 1/2 TABLET (2.'5mg'$ ) on Sundays, Tuesdays and Thursdays   INR up to 3.8 today - no bleeding noted  Continues on broad spectrum antibiotics for cellulitis - ? LOT  Goal of Therapy:  INR 2-3 Monitor platelets by anticoagulation protocol: Yes   Plan:  Hold warfarin  INR to daily  Thank you Anette Guarneri, PharmD 02/06/2022 8:15 AM

## 2022-02-06 NOTE — Progress Notes (Addendum)
PROGRESS NOTE Brittany Tran  RJJ:[id number] DOB: 1961/04/14 DOA: 02/01/2022 PCP: Curt Jews, PA-C   Brief Narrative/Hospital Course:  61 years old female with past medical history of diabetes mellitus type 2, morbid obesity with BMI 43, chronic venous stasis ulceration worse on LLE, was found by neighbors slumped in her chair and minimally responsive after they did not hear from her in 12 hours. EMS was called and  transported to [facility] for evaluation with a concern for UTI and or leg infection and sepsis.In the ED, he was febrile with a temperature of 103.3 F and was tachycardic.  Patient was very confused and disoriented initially.  Patient was then admitted to the hospital for UTI and left lower extremity cellulitis.  Code sepsis was initiated initially.   During hospitalization, CT scan of the abdomen showed possible exophytic mass of the right kidney.  Patient continued to have leg pain and generalized body pain.  MRI of the abdomen showed large renal tumor.  Spoke with urology who recommends a follow-up with Dr. Gilford Rile and Dr. Tresa Moore as outpatient for further evaluation.  Subjective: Today, patient was seen and examined at bedside.  Complains of intractable pain over the lower extremity in the back.  Does not feel well.  Feels fatigued.  Assessment and Plan: Principal Problem:   Cellulitis of left leg Active Problems:   Sepsis due to cellulitis ([place])   Cellulitis of left lower extremity   Diabetes mellitus without complication (HCC)   Chronic diastolic CHF (congestive heart failure) (HCC)   Anticoagulation goal of INR 2 to 3   Renal lesion   Rheumatoid arthritis (HCC)   Hypertension   Skin yeast infection   Hypokalemia   Sepsis secondary to Left lower extremity cellulitis and UTI, POA: History of chronic venous stasis ulceration.  Presented with signs of sepsis.  Currently on vancomycin and Rocephin.  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  Administering with Benadryl and  slow infusion.  Blood cultures negative in 5 days.  Urine culture showed some E. coli but insignificant colony.  Has mild leukocytosis slightly trended initially but then has been trending up again  Acute metabolic encephalopathy.  Present on admission.  Likely multifactorial from sepsis UTI cellulitis.  Improved at this time.   Exophytic lesion on the anterior aspect of the mid upper right kidney CT abdomen patient with 4.8 x 3.4 x 4.1 centimeter exophytic lesion anterior aspect mid-upper right kidney.  Differential diagnosis was hemorrhagic cyst vs tumor.  Urinalysis showed RBC 11-20 per high-power field. 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 Size of 4.6 x 3.5 x 4.6 cm.  Spoke with urology Dr. Suzanne Boron Diarmid who recommends following up with Dr. Gilford Rile or Dr. Tresa Moore for further evaluation as outpatient.  We will need to set up an appointment prior to discharge.  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 pharmacy dosing,   Chronic diastolic CHF- w/ mild acute component with orthopnea  Extensive lower leg edema: Has shortness of breath and dyspnea on exertion but is sedentary.  Last 2D echocardiogram from 02/2015 with grade 1 diastolic dysfunction.  CTA was limited but no evidence of PE but had some vascular congestion.    Patient is negative balance for 5 61 mL at this time.  Continue IV Lasix.   Diabetes mellitus type II on long-term  insulin:  Latest hemoglobin  A1c of 6.4.currently on long-acting and short-acting insulin.  Semglee 10 units at nighttime.  We will continue to monitor for hypoglycemia.  Cutaneous yeast infection under panniculus: Continue nystatin powder  Hypertension: Still borderline hypotensive.  Continue to hold antihypertensives  Rheumatoid arthritis: Continue prednisone.  Hold  methotrexate due to infection.  No acute exacerbation at this time.  Morbid obesity BMI 43 Will benefit from ongoing weight loss as outpatient.  Fibromyalgia, generalized body pain.  Focus on analgesia tramadol was added to the regimen yesterday.  Deconditioning, debility physical therapy recommends home health PT on discharge.  Mild hypokalemia.  We will continue to replace orally.  Low at 3.4 today.  Check levels in AM.  Mild AKI.  Present on admission.  Improving.  DVT prophylaxis: coumadin  Code Status:   Code Status: Full Code  Family Communication:  None at bedside.  Patient status is: Inpatient because of need for IV antibiotics, Debility, weakness, new renal mass, pending clinical improvement  Level of care: Telemetry Medical   Dispo: The patient is from: home   Anticipated disposition: PT OT recommends home health on discharge.  Likely home with home health on 02/07/2022.  Objective:  Vitals last 24 hrs: Vitals:   02/06/22 0100 02/06/22 0332 02/06/22 0515 02/06/22 0516  BP:    (!) 97/56  Pulse:    79  Resp:    18  Temp: (!) 101 F (38.3 C) 99.2 F (37.3 C) 98.7 F (37.1 C)   TempSrc: Oral Oral Oral   SpO2:    100%  Weight:      Height:       Physical Examination:  General: Obese built, not in obvious distress appears older than stated age, fatigued and weak HENT:   No scleral pallor or icterus noted. Oral mucosa is moist.  Chest:    Diminished breath sounds bilaterally. No crackles or wheezes.  CVS: S1 &S2 heard. No murmur.  Regular rate and rhythm. Abdomen: Soft, nontender, nondistended.  Bowel sounds are heard.   Extremities: Bilateral lower extremity chronic venous insufficiency changes with pigmentation and edema. Psych: Alert, awake and oriented, normal mood CNS:  No cranial nerve deficits.  Power equal in all extremities.   Skin: Warm and dry.  Lower extremity venous changes, left lower extremity with venous ulceration and cellulitis covered with  dressing.  Medications reviewed:  Scheduled Meds:  folic acid  2 mg Oral Daily   furosemide  20 mg Intravenous Daily   gabapentin  300 mg Oral TID   gabapentin  400 mg Oral QHS   insulin aspart  0-20 Units Subcutaneous TID WC   insulin glargine-yfgn  10 Units Subcutaneous QHS   nystatin   Topical BID   potassium chloride  40 mEq Oral Once   predniSONE  5 mg Oral Q breakfast   topiramate  25 mg Oral BID   Warfarin - Pharmacist Dosing Inpatient   Does not apply q1600   Continuous Infusions:  cefTRIAXone (ROCEPHIN)  IV 2 g (02/05/22 1742)   vancomycin Stopped (02/06/22 0300)    Data Reviewed:  I have personally reviewed the following labs and imaging studies.    CBC: Recent Labs  Lab 02/02/22 0352 02/03/22 1027 02/04/22 0440 02/05/22 0500 02/06/22 0533  WBC 18.2* 14.0* 12.2* 14.9* 18.6*  HGB 11.2* 12.0 11.8* 11.9* 11.9*  HCT 34.2* 36.3 35.5* 34.8* 36.1  MCV 102.7* 99.2 99.4 98.3 99.2  PLT 135* 146* 153 171 186  Basic Metabolic Panel: Recent Labs  Lab 02/02/22 0352 02/03/22 1027 02/04/22 0440 02/05/22 0500 02/06/22 0533  NA 139 139 139 140 137  K 3.5 2.9* 3.4* 3.4* 3.4*  CL 110 107 113* 109 106  CO2 23 25 21* 23 22  GLUCOSE 96 82 75 79 54*  BUN _0 CREATININE 0.83 0.76 0.70 0.82 0.81  CALCIUM 7.8* 7.7* 7.2* 7.5* 7.4*  MG  --   --   --  1.9  --     GFR: Estimated Creatinine Clearance: 96.5 mL/min (by C-G formula based on SCr of 0.81 mg/dL). Liver Function Tests: Recent Labs  Lab 02/01/22 1612  AST 27  ALT 11  ALKPHOS 104  BILITOT 1.1  PROT 7.2  ALBUMIN 2.9*    No results for input(s): "LIPASE", "AMYLASE" in the last 168 hours. No results for input(s): "AMMONIA" in the last 168 hours. Coagulation Profile: Recent Labs  Lab 02/01/22 1612 02/02/22 0352 02/03/22 1027 02/04/22 0440 02/06/22 0533  INR 2.9* 2.9* 2.6* 2.9* 3.8*    BNP (last 3 results) No results for input(s): "PROBNP" in the last 8760 hours. HbA1C: No results for  input(s): "HGBA1C" in the last 72 hours.  CBG: Recent Labs  Lab 02/05/22 1213 02/05/22 1641 02/05/22 2039 02/06/22 0729 02/06/22 0821  GLUCAP 70 117* 146* 54* 87    Lipid Profile: No results for input(s): "CHOL", "HDL", "LDLCALC", "TRIG", "CHOLHDL", "LDLDIRECT" in the last 72 hours. Thyroid Function Tests: No results for input(s): "TSH", "T4TOTAL", "FREET4", "T3FREE", "THYROIDAB" in the last 72 hours. Sepsis Labs: Recent Labs  Lab 02/01/22 1612 02/01/22 1831  LATICACIDVEN 2.4* 1.8     Recent Results (from the past 240 hour(s))  Culture, blood (Routine x 2)     Status: None   Collection Time: 02/01/22  4:03 PM   Specimen: BLOOD  Result Value Ref Range Status   Specimen Description BLOOD LEFT ANTECUBITAL  Final   Special Requests   Final    BOTTLES DRAWN AEROBIC AND ANAEROBIC Blood Culture adequate volume   Culture   Final    NO GROWTH 5 DAYS Performed at [facility], [address], [place], [place] [address]    Report Status 02/06/2022 FINAL  Final  Culture, blood (Routine x 2)     Status: None   Collection Time: 02/01/22  4:11 PM   Specimen: BLOOD  Result Value Ref Range Status   Specimen Description BLOOD RIGHT ANTECUBITAL  Final   Special Requests   Final    BOTTLES DRAWN AEROBIC AND ANAEROBIC Blood Culture results may not be optimal due to an excessive volume of blood received in culture bottles   Culture   Final    NO GROWTH 5 DAYS Performed at [facility], [place] [address] [place],  [address]    Report Status 02/06/2022 FINAL  Final  Urine Culture     Status: Abnormal   Collection Time: 02/01/22  4:21 PM   Specimen: In/Out Cath Urine  Result Value Ref Range Status   Specimen Description IN/OUT CATH URINE  Final   Special Requests   Final    NONE Performed at [facility], [place] [address] [place], [place] [address]    Culture 100 COLONIES/mL ESCHERICHIA COLI (A)  Final   Report Status 02/04/2022 FINAL  Final   Organism ID,  Bacteria ESCHERICHIA COLI (A)  Final      Susceptibility   Escherichia coli - MIC*  AMPICILLIN 4 SENSITIVE Sensitive     CEFAZOLIN <=4 SENSITIVE Sensitive     CEFEPIME <=0.12 SENSITIVE Sensitive     CEFTRIAXONE <=0.25 SENSITIVE Sensitive     CIPROFLOXACIN <=0.25 SENSITIVE Sensitive     GENTAMICIN <=1 SENSITIVE Sensitive     IMIPENEM <=0.25 SENSITIVE Sensitive     NITROFURANTOIN 32 SENSITIVE Sensitive     TRIMETH/SULFA <=20 SENSITIVE Sensitive     AMPICILLIN/SULBACTAM <=2 SENSITIVE Sensitive     PIP/TAZO <=4 SENSITIVE Sensitive     * 100 COLONIES/mL ESCHERICHIA COLI    Antimicrobials: Anti-infectives (From admission, onward)    Start     Dose/Rate Route Frequency Ordered Stop   02/03/22 1700  cefTRIAXone (ROCEPHIN) 2 g in sodium chloride 0.9 % 100 mL IVPB        2 g 200 mL/hr over 30 Minutes Intravenous Every 24 hours 02/03/22 1018     02/02/22 1700  cefTRIAXone (ROCEPHIN) 1 g in sodium chloride 0.9 % 100 mL IVPB  Status:  Discontinued        1 g 200 mL/hr over 30 Minutes Intravenous Every 24 hours 02/02/22 0154 02/03/22 1018   02/02/22 1100  vancomycin (VANCOREADY) IVPB 750 mg/150 mL        750 mg 75 mL/hr over 120 Minutes Intravenous Every 12 hours 02/01/22 2229     02/01/22 2145  vancomycin (VANCOREADY) IVPB 1750 mg/350 mL        1,750 mg 116.7 mL/hr over 180 Minutes Intravenous  Once 02/01/22 2143 02/02/22 0054   02/01/22 1645  cefTRIAXone (ROCEPHIN) 2 g in sodium chloride 0.9 % 100 mL IVPB        2 g 200 mL/hr over 30 Minutes Intravenous  Once 02/01/22 1633 02/01/22 1843      Radiology Studies: MR ABDOMEN W WO CONTRAST  Result Date: 02/05/2022 CLINICAL DATA:  Renal lesions discovered on recent abdominal and pelvis CT. EXAM: MRI ABDOMEN WITHOUT AND WITH CONTRAST TECHNIQUE: Multiplanar multisequence MR imaging of the abdomen was performed both before and after the administration of intravenous contrast. CONTRAST:  84m GADAVIST GADOBUTROL 1 MMOL/ML IV SOLN COMPARISON:   February 01, 2022 FINDINGS: Lower chest: Incidental imaging of the lung bases shows moderate cardiomegaly and signs of basilar atelectasis. Lung base assessment is limited on MRI. Basilar volume loss in the RIGHT chest. No effusion. Hepatobiliary: Smooth hepatic contours. No focal, suspicious hepatic lesion. Mild hepatic steatosis. Gallbladder is collapsed about numerous intraluminal gallstones most greater than a cm largest 14 mm. No pericholecystic stranding. No biliary duct dilation. Portal vein is patent. Pancreas: Pancreatic atrophy without signs of inflammation or ductal dilation. No visible lesion. Spleen:  Normal. Adrenals/Urinary Tract:  Adrenal glands are normal. RIGHT kidney: Arising from the anterior upper pole of the RIGHT kidney is an exophytic RIGHT renal mass measuring 4.6 x 3.5 x 4.6 cm showing mixed signal on T2, solid characteristics with enhancement. This extends from the superior hilar line along the anterior upper pole. Large RIGHT renal cyst along the posterior margin of the RIGHT kidney measures 12 x 9 cm greatest axial dimension. LEFT kidney: Hyperintense lesion arises from posterior interpolar LEFT kidney measuring approximately 2 cm and showing low signal on T2 weighted imaging and no sign of enhancement. No additional suspicious renal lesions. No hydronephrosis. No perinephric stranding. Stomach/Bowel: Gastrointestinal tract is unremarkable to the extent evaluated on this abdominal MRI. Vascular/Lymphatic: No retroperitoneal adenopathy. No signs of vascular invasion. Other:  No ascites Musculoskeletal: No suspicious bone lesions identified. IMPRESSION: 1. 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 2. Small LEFT renal lesion is compatible with a hemorrhagic cyst. 3. Exam quality mildly compromised by respiratory motion particularly with respect to hepatic assessment. 4. No  evidence of metastatic disease. 5. Large RIGHT renal cyst measuring 12 x 9 cm. 6. Mild hepatic steatosis. 7. Extensive cholelithiasis. No signs of inflammation adjacent to the gallbladder. Electronically Signed   By: Zetta Bills M.D.   On: 02/05/2022 11:32     LOS: 4 days   Flora Lipps, MD [facility] 02/06/2022, 9:21 AM

## 2022-02-06 NOTE — Inpatient Diabetes Management (Signed)
Inpatient Diabetes Program Recommendations  AACE/ADA: New Consensus Statement on Inpatient Glycemic Control (2015)  Target Ranges:  Prepandial:   less than 140 mg/dL      Peak postprandial:   less than 180 mg/dL (1-2 hours)      Critically ill patients:  140 - 180 mg/dL   Lab Results  Component Value Date   GLUCAP 87 02/06/2022   HGBA1C 6.4 (H) 02/02/2022    Review of Glycemic Control  Latest Reference Range & Units 02/05/22 08:52 02/05/22 11:47 02/05/22 12:13 02/05/22 16:41 02/05/22 20:39 02/06/22 07:29 02/06/22 08:21  Glucose-Capillary 70 - 99 mg/dL 79 52 (L) 70 117 (H) 146 (H) 54 (L) 87   Diabetes history: DM 2 Outpatient Diabetes medications: diet controlled Current orders for Inpatient glycemic control:  Semglee 10 units qhs Novolog 0-20 units tid Prednisone 5 mg Daily  A1c 6.4% on 8/19  Inpatient Diabetes Program Recommendations:    Note Hypoglycemia in the 50's. Not on medication at home.  -  d/c semglee -  reduce Novolog Correction to 0-15 units tid + hs  Thanks,  Tama Headings RN, MSN, BC-ADM Inpatient Diabetes Coordinator Team Pager [phone] (8a-5p)

## 2022-02-06 NOTE — Progress Notes (Signed)
Pt noted skin red from neck area to lower BLE very warm to touch gave benadryl prior to dose of vancomycin no c/o pain or distress while receiving ABT notified MD NNO at this time

## 2022-02-06 NOTE — Progress Notes (Signed)
Occupational Therapy Treatment Patient Details Name: Brittany Tran MRN: [id number] DOB: 06-15-1961 Today's Date: 02/06/2022   History of present illness Patient is a 61 y/o female who presents on 8/18 for generalized weakness and found slumped in her chair minimally responsive by her neighbors. Found to have UTI and sepsis secondary to LLE cellulitis. MRI on 02/05/22 suspicious for neoplasm. PMH includes DM, HF, HTN, DVT, fibromyalgia, RA.   OT comments  Pt limited by L LE pain. Completed grooming with set up at EOB, but declined OOB. MD came in room during session and gave pt results of kidney MRI, pt tearful afterwards. Pt is in agreement she will need ST rehab prior to return home. Updated d/c recommendation to SNF.   Recommendations for follow up therapy are one component of a multi-disciplinary discharge planning process, led by the attending physician.  Recommendations may be updated based on patient status, additional functional criteria and insurance authorization.    Follow Up Recommendations  Skilled nursing-short term rehab (<3 hours/day)    Assistance Recommended at Discharge Frequent or constant Supervision/Assistance  Patient can return home with the following  A lot of help with walking and/or transfers;A lot of help with bathing/dressing/bathroom;Assistance with cooking/housework;Assist for transportation;Help with stairs or ramp for entrance   Equipment Recommendations  Tub/shower bench    Recommendations for Other Services      Precautions / Restrictions Precautions Precautions: Fall Restrictions Weight Bearing Restrictions: No       Mobility Bed Mobility Overal bed mobility: Needs Assistance Bed Mobility: Sit to Supine, Supine to Sit     Supine to sit: Mod assist, HOB elevated Sit to supine: Mod assist   General bed mobility comments: increased time, assist to raise trunk and for LEs back into bed.    Transfers                   General  transfer comment: declined     Balance Overall balance assessment: Needs assistance   Sitting balance-Leahy Scale: Fair Sitting balance - Comments: Prefers UE support but able to sit without it                                   ADL either performed or assessed with clinical judgement   ADL Overall ADL's : Needs assistance/impaired     Grooming: Set up;Sitting;Wash/dry hands;Wash/dry face;Oral care                                 General ADL Comments: Educated in energy conservation and managing fatigue, provided written handout to reinforce.    Extremity/Trunk Assessment              Vision       Perception     Praxis      Cognition Arousal/Alertness: Awake/alert Behavior During Therapy: Flat affect Overall Cognitive Status: Impaired/Different from baseline Area of Impairment: Safety/judgement                               General Comments: Pt more realistic that home alone may not be her best option, stating she is in agreement with ST rehab.        Exercises      Shoulder Instructions       General Comments  Pertinent Vitals/ Pain       Pain Assessment Pain Assessment: Faces Faces Pain Scale: Hurts whole lot Pain Location: L LE Pain Descriptors / Indicators: Sore, Constant, Discomfort Pain Intervention(s): Repositioned, Premedicated before session, Monitored during session  Home Living                                          Prior Functioning/Environment              Frequency  Min 2X/week        Progress Toward Goals  OT Goals(current goals can now be found in the care plan section)  Progress towards OT goals: Not progressing toward goals - comment  Acute Rehab OT Goals OT Goal Formulation: With patient Time For Goal Achievement: 02/18/22 Potential to Achieve Goals: Good  Plan Discharge plan needs to be updated    Co-evaluation                 AM-PAC  OT "6 Clicks" Daily Activity     Outcome Measure   Help from another person eating meals?: None Help from another person taking care of personal grooming?: A Little Help from another person toileting, which includes using toliet, bedpan, or urinal?: Total Help from another person bathing (including washing, rinsing, drying)?: A Lot Help from another person to put on and taking off regular upper body clothing?: A Little Help from another person to put on and taking off regular lower body clothing?: Total 6 Click Score: 14    End of Session    OT Visit Diagnosis: Unsteadiness on feet (R26.81);Other abnormalities of gait and mobility (R26.89);Pain;Muscle weakness (generalized) (M62.81) Pain - Right/Left: Right   Activity Tolerance Patient limited by fatigue;Patient limited by pain   Patient Left in bed;with call bell/phone within reach;with bed alarm set (MD in room)   Nurse Communication          Time: 3244-0102 OT Time Calculation (min): 21 min  Charges: OT General Charges $OT Visit: 1 Visit OT Treatments $Self Care/Home Management : 8-22 mins  Cleta Alberts, OTR/L Acute Rehabilitation Services Office: [phone]   Malka So 02/06/2022, 10:52 AM

## 2022-02-06 NOTE — TOC Progression Note (Signed)
Transition of Care [facility]) - Progression Note    Patient Details  Name: Brittany Tran MRN: [id number] Date of Birth: 04-02-1961  Transition of Care [facility] CM/SW Contact  Tom-Johnson, Renea Ee, RN Phone Number: 02/06/2022, 3:24 PM  Clinical Narrative:     Patient continues on IV abx for left leg Cellulitis. On Coumadin and compliant with regimen and lab work. PT/OT now recommending SNF. CM spoke with patient and she is agreeable to SNF. SW to workup patient for SNF. TOC will continue to follow with needs.   Expected Discharge Plan: [place] Barriers to Discharge: Continued Medical Work up  Expected Discharge Plan and Services Expected Discharge Plan: [place]   Discharge Planning Services: CM Consult Post Acute Care Choice: [place] arrangements for the past 2 months: Apartment                                       Social Determinants of Health (SDOH) Interventions    Readmission Risk Interventions     No data to display

## 2022-02-06 NOTE — Progress Notes (Signed)
Mobility Specialist Criteria Algorithm Info.   02/06/22 1440  Pain Assessment  Faces Pain Scale 8  Pain Location LLE  Pain Descriptors / Indicators Sore;Constant;Discomfort  Pain Intervention(s) Limited activity within patient's tolerance  Mobility  Activity Transferred from bed to chair  Range of Motion/Exercises Active;All extremities  Level of Assistance +2 (takes two people)  Geographical information systems officer  Activity Response Tolerated well   Patient received in supine agreeable to participate in mobility despite pain. Patient is motivated and is more aware of her deficits. Was able to assist more this session than prior but is still very much so limited by pain. Needed mod HHA to EOB from supine>sit + cues for hand placement. Attempted sit>stand with a 3-count rocking cadence for momentum, requiring max A+2 to come to a complete stand. Upon standing pt c/o dizziness that subsided quickly with pursed lip breathing. Pt pivot to recliner with min-mod A and tolerated without incident. Was left in recliner with all needs met, call bell in reach.   [place] Allice Garro, [place], [place]  SVXBL:[phone] Office: [phone]

## 2022-02-07 DIAGNOSIS — A419 Sepsis, unspecified organism: Secondary | ICD-10-CM | POA: Diagnosis not present

## 2022-02-07 DIAGNOSIS — I5032 Chronic diastolic (congestive) heart failure: Secondary | ICD-10-CM | POA: Diagnosis not present

## 2022-02-07 DIAGNOSIS — Z5181 Encounter for therapeutic drug level monitoring: Secondary | ICD-10-CM | POA: Diagnosis not present

## 2022-02-07 DIAGNOSIS — L03116 Cellulitis of left lower limb: Secondary | ICD-10-CM | POA: Diagnosis not present

## 2022-02-07 LAB — GLUCOSE, CAPILLARY
Glucose-Capillary: 46 mg/dL — ABNORMAL LOW (ref 70–99)
Glucose-Capillary: 45 mg/dL — ABNORMAL LOW (ref 70–99)
Glucose-Capillary: 50 mg/dL — ABNORMAL LOW (ref 70–99)
Glucose-Capillary: 184 mg/dL — ABNORMAL HIGH (ref 70–99)
Glucose-Capillary: 129 mg/dL — ABNORMAL HIGH (ref 70–99)
Glucose-Capillary: 119 mg/dL — ABNORMAL HIGH (ref 70–99)
Glucose-Capillary: 158 mg/dL — ABNORMAL HIGH (ref 70–99)

## 2022-02-07 LAB — BASIC METABOLIC PANEL
Anion gap: 10 (ref 5–15)
BUN: 14 mg/dL (ref 8–23)
CO2: 22 mmol/L (ref 22–32)
Calcium: 7.7 mg/dL — ABNORMAL LOW (ref 8.9–10.3)
Chloride: 104 mmol/L (ref 98–111)
Creatinine, Ser: 0.94 mg/dL (ref 0.44–1.00)
GFR, Estimated: >60 mL/min (ref >60)
Glucose, Bld: 68 mg/dL — ABNORMAL LOW (ref 70–99)
Potassium: 4 mmol/L (ref 3.5–5.1)
Sodium: 136 mmol/L (ref 135–145)

## 2022-02-07 LAB — CBC
HCT: 41.3 % (ref 36.0–46.0)
Hemoglobin: 13.6 g/dL (ref 12.0–15.0)
MCH: 32.8 pg (ref 26.0–34.0)
MCHC: 32.9 g/dL (ref 30.0–36.0)
MCV: 99.5 fL (ref 80.0–100.0)
Platelets: 233 10*3/uL (ref 150–400)
RBC: 4.15 MIL/uL (ref 3.87–5.11)
RDW: 15.7 % — ABNORMAL HIGH (ref 11.5–15.5)
WBC: 25.2 10*3/uL — ABNORMAL HIGH (ref 4.0–10.5)
nRBC: 0 % (ref 0.0–0.2)

## 2022-02-07 LAB — PROTIME-INR
INR: 3.6 — ABNORMAL HIGH (ref 0.8–1.2)
Prothrombin Time: 35.3 seconds — ABNORMAL HIGH (ref 11.4–15.2)

## 2022-02-07 MED ORDER — DEXTROSE 50 % IV SOLN
INTRAVENOUS | Status: AC
  Filled 2022-02-07: qty 50

## 2022-02-07 MED ORDER — INSULIN GLARGINE-YFGN 100 UNIT/ML ~~LOC~~ SOLN
5.0000 [IU] | Freq: Every day | SUBCUTANEOUS | Status: DC
  Administered 2022-02-07 – 2022-02-12 (×6): 5 [IU] via SUBCUTANEOUS
  Filled 2022-02-07 (×8): qty 0.05

## 2022-02-07 MED ORDER — INSULIN ASPART 100 UNIT/ML IJ SOLN
0.0000 [IU] | Freq: Three times a day (TID) | INTRAMUSCULAR | Status: DC
  Administered 2022-02-07: 1 [IU] via SUBCUTANEOUS
  Administered 2022-02-08: 5 [IU] via SUBCUTANEOUS
  Administered 2022-02-09: 2 [IU] via SUBCUTANEOUS
  Administered 2022-02-09: 3 [IU] via SUBCUTANEOUS
  Administered 2022-02-09 – 2022-02-10 (×2): 2 [IU] via SUBCUTANEOUS
  Administered 2022-02-10: 3 [IU] via SUBCUTANEOUS
  Administered 2022-02-10 – 2022-02-11 (×2): 2 [IU] via SUBCUTANEOUS
  Administered 2022-02-11: 3 [IU] via SUBCUTANEOUS
  Administered 2022-02-12: 2 [IU] via SUBCUTANEOUS
  Administered 2022-02-13: 1 [IU] via SUBCUTANEOUS

## 2022-02-07 MED ORDER — ADULT MULTIVITAMIN W/MINERALS CH
1.0000 | ORAL_TABLET | Freq: Every day | ORAL | Status: DC
  Administered 2022-02-07 – 2022-02-13 (×7): 1 via ORAL
  Filled 2022-02-07 (×7): qty 1

## 2022-02-07 MED ORDER — DEXTROSE 50 % IV SOLN
1.0000 | Freq: Once | INTRAVENOUS | Status: AC
  Administered 2022-02-07: 50 mL via INTRAVENOUS

## 2022-02-07 MED ORDER — ENSURE ENLIVE PO LIQD
237.0000 mL | Freq: Two times a day (BID) | ORAL | Status: DC
Start: 2022-02-07 — End: 2022-02-13
  Administered 2022-02-07 – 2022-02-13 (×8): 237 mL via ORAL
  Filled 2022-02-07: qty 237

## 2022-02-07 NOTE — Progress Notes (Signed)
Initial Nutrition Assessment  DOCUMENTATION CODES:   Morbid obesity  INTERVENTION:  Liberalize diet from a heart healthy/carb modified to a regular diet to provide widest variety of menu options to enhance nutritional adequacy Ensure Enlive po BID, each supplement provides 350 kcal and 20 grams of protein. MVI with minerals daily Yogurt snacks between meals Consider adding bowel regimen  NUTRITION DIAGNOSIS:   Increased nutrient needs related to acute illness as evidenced by estimated needs  GOAL:   Patient will meet greater than or equal to 90% of their needs  MONITOR:   PO intake, Supplement acceptance, Labs, Diet advancement, Weight trends, I & O's, Skin  REASON FOR ASSESSMENT:   Malnutrition Screening Tool    ASSESSMENT:   Pt admitted with cellulitis of L leg. PMH significant for DM, morbid obesity, chronic venous stasis ulceration, worse LLE.  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 Urology recommendation for outpatient follow up.   Pt appeared drowsy as she continued to close her eyes during assessment. She provided limited recall of nutrition related history PTA. During admission she endorses eating poorly as she is used to having salt on her meals which has been limited during admission.   Meal completions: 8/22: 0%-breakfast, 50%-lunch 8/23: 100%-breakfast, 0%-lunch, 50%-dinner 8/24: 100%-breakfast, 75%-lunch  She endorses having a weight loss of ~17 lbs within the last 2 weeks. Per review of weight history it appears she has had a 5.9% weight loss between 06/20-07/26 which is significant for time frame.   She reports that her last BM was 1 week ago but does not want a bowel regimen added as she does not like stool softeners.   Addressed patient's questions regarding healthy eating. Discussed liberalization of her diet during admission to provide wider variety of menu options to enhance her PO intake. She enjoys yogurt and would like these ordered as a snack  between meals.   Medications: folvite, lasix '20mg'$  daily, SSI 0-9 units TID, semglee 5 units daily, nystatin, klor-con, prednisone, warfarin  Labs: Ca 7.7, HgbA1c 6.4%, CBG's 129/184/50/45/46/99 x24 hours  UOP: 1.9L x24 hours I/O's: -1075m since admission  NUTRITION - FOCUSED PHYSICAL EXAM:  Flowsheet Row Most Recent Value  Orbital Region No depletion  Upper Arm Region No depletion  Thoracic and Lumbar Region No depletion  Buccal Region No depletion  Temple Region No depletion  Clavicle Bone Region No depletion  Clavicle and Acromion Bone Region No depletion  Scapular Bone Region No depletion  Dorsal Hand No depletion  Patellar Region No depletion  Anterior Thigh Region No depletion  Posterior Calf Region No depletion  Edema (RD Assessment) Mild  [L>RLE]  Hair Reviewed  Eyes Reviewed  Mouth Reviewed  Skin Other (Comment)  [red inflammed skin on bilateral arms, up to her neck]  Nails Reviewed      Diet Order:   Diet Order             Diet heart healthy/carb modified Room service appropriate? Yes; Fluid consistency: Thin  Diet effective now                   EDUCATION NEEDS:   Education needs have been addressed  Skin:  Skin Assessment: Reviewed RN Assessment  Last BM:  8/17  Height:   Ht Readings from Last 1 Encounters:  02/01/22 '5\' 6"'$  (1.676 m)    Weight:   Wt Readings from Last 1 Encounters:  02/01/22 123.4 kg    Ideal Body Weight:  59.1 kg  BMI:  Body  mass index is 43.91 kg/m.  Estimated Nutritional Needs:   Kcal:  1600-1800  Protein:  80-95g  Fluid:  >/=1.6L  Clayborne Dana, RDN, LDN Clinical Nutrition

## 2022-02-07 NOTE — Progress Notes (Signed)
Mobility Specialist Criteria Algorithm Info.   02/07/22 1500  Mobility  Activity Transferred from chair to bed  Range of Motion/Exercises Active;All extremities  Level of Assistance Moderate assist, patient does 50-74%  Assistive Device Front wheel walker  Activity Response Tolerated well   Patient received in recliner chair requesting assistance B2B. Presented with less lethargy and pain better managed. Required mod A to stand from a low surface and took steps to recliner with min G. Requires min assist from sit > supine more so assisting LE's. Tolerated without complaint or incident. Was left with all needs met, call bell in reach.   [place] Leo Fray, [place], [place]  FJFJK:[phone] Office: [phone]

## 2022-02-07 NOTE — NC FL2 (Signed)
[place] LEVEL OF CARE SCREENING TOOL     IDENTIFICATION  Patient Name: Brittany Tran Birthdate: 1961-01-02 Sex: female Admission Date (Current Location): 02/01/2022  [facility] and [place] Number:  Herbalist and Address:  [facility], [place] [address] [place], [place] [address]      Provider Number: [id number]  Attending Physician Name and Address:  Flora Lipps, MD  Relative Name and Phone Number:       Current Level of Care: Hospital Recommended Level of Care: [place] Prior Approval Number:    Date Approved/Denied:   PASRR Number: [id number]  Discharge Plan: SNF    Current Diagnoses: Patient Active Problem List   Diagnosis Date Noted   Chronic diastolic CHF (congestive heart failure) ([place]) 02/02/2022   Skin yeast infection 02/02/2022   Anticoagulation goal of INR 2 to 3 02/02/2022   Renal lesion 02/02/2022   Cellulitis of left leg 02/02/2022   Morbid obesity with BMI of 50.0-59.9, adult ([place]) 12/29/2019   Sepsis ([place]) 12/28/2019   Chronic venous insufficiency 11/02/2019   Venous stasis ulcer of left calf with fat layer exposed with varicose veins ([place]) 06/22/2018   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[place]) 05/19/2018   Rheumatoid arthritis ([place])    Hypertension    Diabetes mellitus without complication ([place])    Long term (current) use of anticoagulants 05/01/2017   Varicose veins of bilateral lower extremities with other complications 58/52/7782   Sepsis due to cellulitis ([place]) 03/13/2017   Hypokalemia 03/13/2017   Cellulitis of left lower extremity     Orientation RESPIRATION BLADDER Height & Weight     Self, Time, Situation, Place  Normal Incontinent Weight: 272 lb 0.8 oz (123.4 kg) Height:  '5\' 6"'$  (167.6 cm)  BEHAVIORAL SYMPTOMS/MOOD NEUROLOGICAL BOWEL NUTRITION STATUS      Continent Diet (heart healthy/carb modified)  AMBULATORY STATUS COMMUNICATION OF NEEDS Skin   Limited Assist  Verbally Normal                       Personal Care Assistance Level of Assistance  Bathing, Feeding, Dressing Bathing Assistance: Limited assistance Feeding assistance: Independent Dressing Assistance: Limited assistance     Functional Limitations Info  Sight Sight Info: Impaired        SPECIAL CARE FACTORS FREQUENCY  PT (By licensed PT), OT (By licensed OT)     PT Frequency: 5x/wk OT Frequency: 5x/wk            Contractures Contractures Info: Not present    Additional Factors Info  Code Status, Allergies, Insulin Sliding Scale Code Status Info: Full Allergies Info: Penicillins, Vancomycin, Penicillin G Benzathine, Sulfa Antibiotics   Insulin Sliding Scale Info: see DC summary       Current Medications (02/07/2022):  This is the current hospital active medication list Current Facility-Administered Medications  Medication Dose Route Frequency Provider Last Rate Last Admin   acetaminophen (TYLENOL) tablet 650 mg  650 mg Oral Q6H PRN Norins, Heinz Knuckles, MD   650 mg at 02/06/22 1643   dextrose 50 % solution            diphenhydrAMINE (BENADRYL) injection 12.5 mg  12.5 mg Intravenous Q8H PRN Kc, Ramesh, MD   12.5 mg at 42/35/36 1443   folic acid (FOLVITE) tablet 2 mg  2 mg Oral Daily Norins, Heinz Knuckles, MD   2 mg at 02/07/22 0821   furosemide (LASIX) injection 20 mg  20 mg  Intravenous Daily Kc, Ramesh, MD   20 mg at 02/07/22 2094   gabapentin (NEURONTIN) capsule 300 mg  300 mg Oral TID Neena Rhymes, MD   300 mg at 02/07/22 7096   gabapentin (NEURONTIN) capsule 400 mg  400 mg Oral QHS Norins, Heinz Knuckles, MD   400 mg at 02/06/22 2211   insulin aspart (novoLOG) injection 0-9 Units  0-9 Units Subcutaneous TID WC Pokhrel, Laxman, MD       insulin glargine-yfgn (SEMGLEE) injection 5 Units  5 Units Subcutaneous QHS Pokhrel, Laxman, MD       LORazepam (ATIVAN) tablet 0.5 mg  0.5 mg Oral QHS PRN Norins, Heinz Knuckles, MD   0.5 mg at 02/06/22 2212   nystatin (MYCOSTATIN/NYSTOP)  topical powder   Topical BID Norins, Heinz Knuckles, MD   Given at 02/06/22 2214   oxyCODONE-acetaminophen (PERCOCET/ROXICET) 5-325 MG per tablet 1-2 tablet  1-2 tablet Oral Q6H PRN Pokhrel, Laxman, MD   2 tablet at 02/07/22 0517   potassium chloride SA (KLOR-CON M) CR tablet 40 mEq  40 mEq Oral Daily Pokhrel, Laxman, MD   40 mEq at 02/07/22 0840   predniSONE (DELTASONE) tablet 5 mg  5 mg Oral Q breakfast Norins, Heinz Knuckles, MD   5 mg at 02/07/22 2836   topiramate (TOPAMAX) tablet 25 mg  25 mg Oral BID Neena Rhymes, MD   25 mg at 02/07/22 6294   traMADol (ULTRAM) tablet 50 mg  50 mg Oral Q6H PRN Flora Lipps, MD   50 mg at 02/06/22 0701   Warfarin - Pharmacist Dosing Inpatient   Does not apply [id number] Dawayne Cirri [facility]   Given at 02/05/22 1742     Discharge Medications: Please see discharge summary for a list of discharge medications.  Relevant Imaging Results:  Relevant Lab Results:   Additional Information SS#: [id number]  Geralynn Ochs, LCSW

## 2022-02-07 NOTE — TOC Progression Note (Signed)
Transition of Care [facility]) - Progression Note    Patient Details  Name: Brittany Tran MRN: [id number] Date of Birth: 28-Mar-1961  Transition of Care [facility]) CM/SW [place], [place] Phone Number: 02/07/2022, 10:48 AM  Clinical Narrative:   Patient agreeable to SNF placement. CSW faxed out referral, will follow with bed offers.    Expected Discharge Plan: [place] Barriers to Discharge: Continued Medical Work up  Expected Discharge Plan and Services Expected Discharge Plan: [place]   Discharge Planning Services: CM Consult Post Acute Care Choice: [place] arrangements for the past 2 months: Apartment                                       Social Determinants of Health (SDOH) Interventions    Readmission Risk Interventions     No data to display

## 2022-02-07 NOTE — Progress Notes (Signed)
Physical Therapy Treatment Patient Details Name: Brittany Tran MRN: [id number] DOB: 1960-08-17 Today's Date: 02/07/2022   History of Present Illness Patient is a 61 y/o female who presents on 8/18 for generalized weakness and found slumped in her chair minimally responsive by her neighbors. Found to have UTI and sepsis secondary to LLE cellulitis. 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 PMH includes DM, HF, HTN, DVT, fibromyalgia, RA.    PT Comments    Patient pre-medicated for pain and states she does not feel like she has had anything for pain. Reports pain all over related to her RA and fibromyalgia. Moving VERY slowly, but with HOB elevated and use of rail was able to get supine to sitting with supervision only. Bed elevated to simulate her lift chair at home (which she sleeps in) and able to stand with +1 min assist to RW. Ambulated only 3 ft to chair due to pain and reports of nausea.     Recommendations for follow up therapy are one component of a multi-disciplinary discharge planning process, led by the attending physician.  Recommendations may be updated based on patient status, additional functional criteria and insurance authorization.  Follow Up Recommendations  Skilled nursing-short term rehab (<3 hours/day) Can patient physically be transported by private vehicle: No   Assistance Recommended at Discharge Frequent or constant Supervision/Assistance  Patient can return home with the following A lot of help with walking and/or transfers;Assistance with cooking/housework;Assist for transportation;Help with stairs or ramp for entrance;A little help with bathing/dressing/bathroom   Equipment Recommendations  Rolling walker (2 wheels)    Recommendations for Other Services       Precautions / Restrictions Precautions Precautions: Fall Restrictions Weight Bearing Restrictions: No     Mobility  Bed Mobility Overal bed mobility: Needs Assistance Bed Mobility:  Supine to Sit     Supine to sit: HOB elevated, Supervision (with rail)     General bed mobility comments: significantly increased time and effort with heavy use of bed rail    Transfers Overall transfer level: Needs assistance Equipment used: Rolling walker (2 wheels) Transfers: Sit to/from Stand, Bed to chair/wheelchair/BSC Sit to Stand: Min assist, From elevated surface   Step pivot transfers: Min guard       General transfer comment: bed elevated ~5" and pt pushing off bed with LUE    Ambulation/Gait Ambulation/Gait assistance: Min guard Gait Distance (Feet): 3 Feet Assistive device: Rolling walker (2 wheels) Gait Pattern/deviations: Step-through pattern, Decreased stride length, Trunk flexed Gait velocity: decreased     General Gait Details: Slow, mildly unsteady gait with use of RW for support. NOt safe to ambulate with SPC today due to weakness/pain.   Stairs             Wheelchair Mobility    Modified Rankin (Stroke Patients Only)       Balance Overall balance assessment: Needs assistance Sitting-balance support: Feet supported, No upper extremity supported Sitting balance-Leahy Scale: Fair     Standing balance support: During functional activity, Single extremity supported, Bilateral upper extremity supported Standing balance-Leahy Scale: Poor Standing balance comment: requires Both handson RW and outside support to maintain standing                            Cognition Arousal/Alertness: Awake/alert Behavior During Therapy: Flat affect Overall Cognitive Status: Impaired/Different from baseline Area of Impairment: Safety/judgement  Safety/Judgement: Decreased awareness of safety, Decreased awareness of deficits     General Comments: Agrees with need for ST SNF for rehab        Exercises      General Comments        Pertinent Vitals/Pain Pain Assessment Pain Assessment: Faces Faces  Pain Scale: Hurts whole lot Pain Location: LLE and all over Pain Descriptors / Indicators: Sore, Constant, Discomfort Pain Intervention(s): Limited activity within patient's tolerance, Monitored during session, Premedicated before session    Home Living                          Prior Function            PT Goals (current goals can now be found in the care plan section) Acute Rehab PT Goals Patient Stated Goal: to go home Time For Goal Achievement: 02/18/22 Potential to Achieve Goals: Fair Progress towards PT goals: Progressing toward goals (pain limiting)    Frequency    Min 3X/week      PT Plan Current plan remains appropriate    Co-evaluation              AM-PAC PT "6 Clicks" Mobility   Outcome Measure  Help needed turning from your back to your side while in a flat bed without using bedrails?: A Little Help needed moving from lying on your back to sitting on the side of a flat bed without using bedrails?: A Lot Help needed moving to and from a bed to a chair (including a wheelchair)?: A Little Help needed standing up from a chair using your arms (e.g., wheelchair or bedside chair)?: Total Help needed to walk in hospital room?: A Little Help needed climbing 3-5 steps with a railing? : Total 6 Click Score: 13    End of Session Equipment Utilized During Treatment: Gait belt Activity Tolerance: Patient limited by pain;Patient limited by fatigue Patient left: with call bell/phone within reach;in chair;with chair alarm set Nurse Communication: Mobility status PT Visit Diagnosis: Pain;Muscle weakness (generalized) (M62.81);Difficulty in walking, not elsewhere classified (R26.2) Pain - Right/Left:  (bil) Pain - part of body: Leg     Time: 8182-9937 PT Time Calculation (min) (ACUTE ONLY): 35 min  Charges:  $Gait Training: 8-22 mins $Therapeutic Activity: 8-22 mins                      Arby Barrette, PT Acute Rehabilitation Services  Office  [phone]    Rexanne Mano 02/07/2022, 10:18 AM

## 2022-02-07 NOTE — Progress Notes (Signed)
[place] for warfarin Indication: Hx of DVT  Allergies  Allergen Reactions   Penicillins Hives    Has patient had a PCN reaction causing immediate rash, facial/tongue/throat swelling, SOB or lightheadedness with hypotension: Yes Has patient had a PCN reaction causing severe rash involving mucus membranes or skin necrosis: No Has patient had a PCN reaction that required hospitalization: No Has patient had a PCN reaction occurring within the last 10 years: No If all of the above answers are "NO", then may proceed with Cephalosporin use.    Vancomycin Rash    Developed a red rash bilaterally on her arms, legs, neck, chest and abdomen   Penicillin G Benzathine Rash   Sulfa Antibiotics Hives and Rash    Other reaction(s): Other (See Comments) Other reaction(s): Unknown      Labs: Recent Labs    02/05/22 0500 02/06/22 0533 02/07/22 0454  HGB 11.9* 11.9* 13.6  HCT 34.8* 36.1 41.3  PLT 171 186 233  LABPROT  --  36.9* 35.3*  INR  --  3.8* 3.6*  CREATININE 0.82 0.81 0.94     Estimated Creatinine Clearance: 83.2 mL/min (by C-G formula based on SCr of 0.94 mg/dL). Assessment: 37 yoF minimally responsive suspected to have UTI and or leg infection. Patient is on warfarin prior to admission last dose 8/17. HgB/PLT WNL  PTA warfarin dose:  Take 1 tablet ('5mg'$ ) Monday, Wednesday, Friday and Saturday and 1/2 TABLET (2.'5mg'$ ) on Sundays, Tuesdays and Thursdays   INR up to 3.6 today (down from 3.8) - no bleeding noted  Goal of Therapy:  INR 2-3 Monitor platelets by anticoagulation protocol: Yes   Plan:  Hold warfarin today INR to daily  Thank you Anette Guarneri, PharmD 02/07/2022 8:19 AM

## 2022-02-07 NOTE — Progress Notes (Signed)
PROGRESS NOTE Sultana Tierney  TKZ:[id number] DOB: May 05, 1961 DOA: 02/01/2022 PCP: Curt Jews, PA-C   Brief Narrative/Hospital Course:  61 years old female with past medical history of diabetes mellitus type 2, morbid obesity with BMI 43, chronic venous stasis ulceration worse on LLE, was found by neighbors slumped in her chair and minimally responsive after they did not hear from her in 12 hours. EMS was called and  transported to [facility] for evaluation with a concern for UTI and or leg infection and sepsis.In the ED, he was febrile with a temperature of 103.3 F and was tachycardic.  Patient was very confused and disoriented initially.  Patient was then admitted to the hospital for UTI and left lower extremity cellulitis.  Code sepsis was initiated initially.   During hospitalization, CT scan of the abdomen showed possible exophytic mass of the right kidney.  Patient continued to have leg pain and generalized body pain.  MRI of the abdomen showed large renal tumor.  Spoke with urology who recommends a follow-up with Dr. Gilford Rile and Dr. Tresa Moore as outpatient for further evaluation.  Subjective: Today, patient was seen and examined at bedside.  Complains of fatigue weakness and pain.    Assessment and Plan: Principal Problem:   Cellulitis of left leg Active Problems:   Sepsis due to cellulitis ([place])   Cellulitis of left lower extremity   Diabetes mellitus without complication (HCC)   Chronic diastolic CHF (congestive heart failure) (HCC)   Anticoagulation goal of INR 2 to 3   Renal lesion   Rheumatoid arthritis (HCC)   Hypertension   Skin yeast infection   Hypokalemia   Sepsis secondary to Left lower extremity cellulitis and UTI, POA: History of chronic venous stasis ulceration.  Presented with signs of sepsis.  Currently on vancomycin and Rocephin.  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  Initially administered with Benadryl and slow infusion.  Blood cultures negative in 5 days.   Urine culture showed some E. coli but insignificant colony.  Trending of leukocytosis.  Vancomycin has been discontinued due to risk, persistent over the skin.  Has been transitioned to cefazolin at this time.  Need to monitor WBC count.    Latest Ref Rng & Units 02/07/2022    4:54 AM 02/06/2022    5:33 AM 02/05/2022    5:00 AM  CBC  WBC 4.0 - 10.5 K/uL 25.2  18.6  14.9   Hemoglobin 12.0 - 15.0 g/dL 13.6  11.9  11.9   Hematocrit 36.0 - 46.0 % 41.3  36.1  34.8   Platelets 150 - 400 K/uL 233  186  171      Acute metabolic encephalopathy.  Present on admission.  Likely multifactorial from sepsis, UTI cellulitis.  Improved at this time.   Exophytic lesion on the anterior aspect of the mid upper right kidney CT abdomen patient with 4.8 x 3.4 x 4.1 centimeter exophytic lesion anterior aspect mid-upper right kidney.  Differential diagnosis was hemorrhagic cyst vs tumor.  Urinalysis showed RBC 11-20 per high-power field. 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 Size of 4.6 x 3.5 x 4.6 cm.  Spoke with urology Dr. Suzanne Boron Diarmid on 02/06/2022 who recommends following up with Dr. Gilford Rile or Dr. Tresa Moore for further evaluation as outpatient.  We will need to set up an appointment prior to discharge.  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 pharmacy dosing, latest INR of 3.6  Chronic diastolic CHF- w/ mild acute component with orthopnea  Extensive lower leg edema: Has shortness of breath and dyspnea on exertion but is sedentary.  Last 2D echocardiogram from 02/2015 with grade 1 diastolic dysfunction.  CTA was limited but no evidence of pulmonary embolism but had some vascular congestion.    Patient is negative balance for 1046 mL at this time.  Continue IV Lasix.   Diabetes mellitus type II on long-term insulin with  hyperglycemia today.:  Had hypoglycemia this morning.  We will decrease long-acting insulin to 5 units at nighttime and change sliding scale insulin to low dose.    Cutaneous yeast infection under panniculus: Continue nystatin powder  Hypertension: Still borderline hypotensive.  Continue to hold antihypertensives  Rheumatoid arthritis: Continue prednisone.  Hold methotrexate due to infection.  No acute exacerbation at this time.  Complains of generalized joint and body pain.  Morbid obesity BMI 43 Will benefit from ongoing weight loss as outpatient.  Fibromyalgia, generalized body pain.  Focus on analgesia.  Currently on tramadol and oxycodone for pain management.  Deconditioning, debility physical therapy now recommends skilled nursing facility placement.  Mild hypokalemia.  Improved after replacement.  Mild AKI.  Resolved.  Present on admission.  Latest creatinine of 0.9.  DVT prophylaxis: coumadin  Code Status:   Code Status: Full Code  Family Communication:  None at bedside.  Patient status is: Inpatient because of need for IV antibiotics, Debility, weakness, new renal mass, pending clinical improvement  Level of care: Telemetry Medical   Dispo: The patient is from: home   Anticipated disposition: PT OT recommends skilled nursing facility on discharge.  We will consult TOC  Objective:  Vitals last 24 hrs: Vitals:   02/06/22 0931 02/06/22 1639 02/07/22 0504 02/07/22 0905  BP: 114/75 (!) 106/44 95/63 (!) 98/58  Pulse: 85 94 87 98  Resp: _0 Temp: 98.5 F (36.9 C) (!) 100.5 F (38.1 C) 98.9 F (37.2 C) 98.7 F (37.1 C)  TempSrc:   Oral Oral  SpO2: 99% 98% 100% 100%  Weight:      Height:       Physical Examination:  General: Obese built, not in obvious distress appears weak and deconditioned and older than stated age HENT:   Mildly pale.. Oral mucosa is moist.  Chest:   Diminished breath sounds bilaterally. No crackles or wheezes.  CVS: S1 &S2 heard.  No murmur.  Regular rate and rhythm. Abdomen: Soft, nontender, nondistended.  Bowel sounds are heard.   Extremities: No cyanosis, clubbing bilateral chronic venous insufficiency changes with pigmentation and edema.  Peripheral pulses are palpable. Psych: Alert, awake and oriented, normal mood CNS:  No cranial nerve deficits.  Power equal in all extremities.   Skin: Warm and dry.  Lower extremity venous changes, left lower extremity venous ulceration with cellulitis, with dressing.   Medications reviewed:  Scheduled Meds:  dextrose       feeding supplement  237 mL Oral BID BM   folic acid  2 mg Oral Daily   furosemide  20 mg Intravenous Daily   gabapentin  300 mg Oral TID   gabapentin  400 mg Oral QHS   insulin aspart  0-9 Units Subcutaneous TID WC   insulin glargine-yfgn  5 Units Subcutaneous QHS   multivitamin with minerals  1 tablet Oral Daily   nystatin   Topical BID   potassium chloride  40 mEq Oral Daily   predniSONE  5 mg Oral Q breakfast   topiramate  25 mg Oral BID   Warfarin - Pharmacist Dosing Inpatient   Does not apply q1600   Continuous Infusions:   Data Reviewed:  I have personally reviewed the following labs and imaging studies.    CBC: Recent Labs  Lab 02/03/22 1027 02/04/22 0440 02/05/22 0500 02/06/22 0533 02/07/22 0454  WBC 14.0* 12.2* 14.9* 18.6* 25.2*  HGB 12.0 11.8* 11.9* 11.9* 13.6  HCT 36.3 35.5* 34.8* 36.1 41.3  MCV 99.2 99.4 98.3 99.2 99.5  PLT 146* 153 171 186 400    Basic Metabolic Panel: Recent Labs  Lab 02/03/22 1027 02/04/22 0440 02/05/22 0500 02/06/22 0533 02/07/22 0454  NA 139 139 140 137 136  K 2.9* 3.4* 3.4* 3.4* 4.0  CL 107 113* 109 106 104  CO2 25 21* _0 GLUCOSE 82 75 79 54* 68*  BUN _1 CREATININE 0.76 0.70 0.82 0.81 0.94  CALCIUM 7.7* 7.2* 7.5* 7.4* 7.7*  MG  --   --  1.9  --   --     GFR: Estimated Creatinine Clearance: 83.2 mL/min (by C-G formula based on SCr of 0.94 mg/dL). Liver Function  Tests: Recent Labs  Lab 02/01/22 1612  AST 27  ALT 11  ALKPHOS 104  BILITOT 1.1  PROT 7.2  ALBUMIN 2.9*    No results for input(s): "LIPASE", "AMYLASE" in the last 168 hours. No results for input(s): "AMMONIA" in the last 168 hours. Coagulation Profile: Recent Labs  Lab 02/02/22 0352 02/03/22 1027 02/04/22 0440 02/06/22 0533 02/07/22 0454  INR 2.9* 2.6* 2.9* 3.8* 3.6*    BNP (last 3 results) No results for input(s): "PROBNP" in the last 8760 hours. HbA1C: No results for input(s): "HGBA1C" in the last 72 hours.  CBG: Recent Labs  Lab 02/07/22 0722 02/07/22 0743 02/07/22 0802 02/07/22 0853 02/07/22 1137  GLUCAP 46* 45* 50* 184* 129*    Lipid Profile: No results for input(s): "CHOL", "HDL", "LDLCALC", "TRIG", "CHOLHDL", "LDLDIRECT" in the last 72 hours. Thyroid Function Tests: No results for input(s): "TSH", "T4TOTAL", "FREET4", "T3FREE", "THYROIDAB" in the last 72 hours. Sepsis Labs: Recent Labs  Lab 02/01/22 1612 02/01/22 1831  LATICACIDVEN 2.4* 1.8     Recent Results (from the past 240 hour(s))  Culture, blood (Routine x 2)     Status: None   Collection Time: 02/01/22  4:03 PM   Specimen: BLOOD  Result Value Ref Range Status   Specimen Description BLOOD LEFT ANTECUBITAL  Final   Special Requests   Final    BOTTLES DRAWN AEROBIC AND ANAEROBIC Blood Culture adequate volume   Culture   Final    NO GROWTH 5 DAYS Performed at [facility], [address], [place], [place] [address]    Report Status 02/06/2022 FINAL  Final  Culture, blood (Routine x 2)     Status: None   Collection Time: 02/01/22  4:11 PM   Specimen: BLOOD  Result Value Ref Range Status   Specimen Description BLOOD RIGHT ANTECUBITAL  Final   Special Requests   Final    BOTTLES DRAWN AEROBIC AND ANAEROBIC Blood Culture results may not be optimal due to an excessive volume of blood received in culture bottles   Culture   Final    NO GROWTH 5 DAYS Performed at [facility], [place] [address] [place], [place] [address]    Report Status  02/06/2022 FINAL  Final  Urine Culture     Status: Abnormal   Collection Time: 02/01/22  4:21 PM   Specimen: In/Out Cath Urine  Result Value Ref Range Status   Specimen Description IN/OUT CATH URINE  Final   Special Requests   Final    NONE Performed at [facility], [place] [address], [place], [place] [address]    Culture 100 COLONIES/mL ESCHERICHIA COLI (A)  Final   Report Status 02/04/2022 FINAL  Final   Organism ID, Bacteria ESCHERICHIA COLI (A)  Final      Susceptibility   Escherichia coli - MIC*    AMPICILLIN 4 SENSITIVE Sensitive     CEFAZOLIN <=4 SENSITIVE Sensitive     CEFEPIME <=0.12 SENSITIVE Sensitive     CEFTRIAXONE <=0.25 SENSITIVE Sensitive     CIPROFLOXACIN <=0.25 SENSITIVE Sensitive     GENTAMICIN <=1 SENSITIVE Sensitive     IMIPENEM <=0.25 SENSITIVE Sensitive     NITROFURANTOIN 32 SENSITIVE Sensitive     TRIMETH/SULFA <=20 SENSITIVE Sensitive     AMPICILLIN/SULBACTAM <=2 SENSITIVE Sensitive     PIP/TAZO <=4 SENSITIVE Sensitive     * 100 COLONIES/mL ESCHERICHIA COLI    Antimicrobials: Anti-infectives (From admission, onward)    Start     Dose/Rate Route Frequency Ordered Stop   02/03/22 1700  cefTRIAXone (ROCEPHIN) 2 g in sodium chloride 0.9 % 100 mL IVPB  Status:  Discontinued        2 g 200 mL/hr over 30 Minutes Intravenous Every 24 hours 02/03/22 1018 02/07/22 0816   02/02/22 1700  cefTRIAXone (ROCEPHIN) 1 g in sodium chloride 0.9 % 100 mL IVPB  Status:  Discontinued        1 g 200 mL/hr over 30 Minutes Intravenous Every 24 hours 02/02/22 0154 02/03/22 1018   02/02/22 1100  vancomycin (VANCOREADY) IVPB 750 mg/150 mL  Status:  Discontinued        750 mg 75 mL/hr over 120 Minutes Intravenous Every 12 hours 02/01/22 2229 02/06/22 1715   02/01/22 2145  vancomycin (VANCOREADY) IVPB 1750 mg/350 mL        1,750 mg 116.7 mL/hr over 180 Minutes Intravenous  Once 02/01/22 2143 02/02/22 0054    02/01/22 1645  cefTRIAXone (ROCEPHIN) 2 g in sodium chloride 0.9 % 100 mL IVPB        2 g 200 mL/hr over 30 Minutes Intravenous  Once 02/01/22 1633 02/01/22 1843      Radiology Studies: No results found.   LOS: 5 days   Flora Lipps, MD [facility] 02/07/2022, 2:23 PM

## 2022-02-07 NOTE — Progress Notes (Addendum)
Hypoglycemic Event  CBG: 46  Treatment: 8 oz juice/soda, breakfast tray given  Symptoms: None  Follow-up CBG: Time:7:37 CBG Result:45  Treatment: 8 oz juice/soda  Follow-up CBG: Time:7:58 CBG Result:50  Treatment: dextrose 50  Follow-up CBG: Time:8:33 CBG Result:184  Possible Reasons for Event: Inadequate meal intake, no night time snack, semglee 10 units at bedtime  Comments/MD notified:hypoglycemic protocol followed new orders placed     Brittany Tran Brittany Tran

## 2022-02-08 DIAGNOSIS — R21 Rash and other nonspecific skin eruption: Secondary | ICD-10-CM

## 2022-02-08 DIAGNOSIS — A419 Sepsis, unspecified organism: Secondary | ICD-10-CM | POA: Diagnosis not present

## 2022-02-08 DIAGNOSIS — L03116 Cellulitis of left lower limb: Secondary | ICD-10-CM | POA: Diagnosis not present

## 2022-02-08 DIAGNOSIS — Z5181 Encounter for therapeutic drug level monitoring: Secondary | ICD-10-CM | POA: Diagnosis not present

## 2022-02-08 DIAGNOSIS — I5032 Chronic diastolic (congestive) heart failure: Secondary | ICD-10-CM | POA: Diagnosis not present

## 2022-02-08 LAB — BASIC METABOLIC PANEL
Anion gap: 10 (ref 5–15)
BUN: 16 mg/dL (ref 8–23)
CO2: 21 mmol/L — ABNORMAL LOW (ref 22–32)
Calcium: 7.9 mg/dL — ABNORMAL LOW (ref 8.9–10.3)
Chloride: 104 mmol/L (ref 98–111)
Creatinine, Ser: 0.95 mg/dL (ref 0.44–1.00)
GFR, Estimated: >60 mL/min (ref >60)
Glucose, Bld: 81 mg/dL (ref 70–99)
Potassium: 4 mmol/L (ref 3.5–5.1)
Sodium: 135 mmol/L (ref 135–145)

## 2022-02-08 LAB — GLUCOSE, CAPILLARY
Glucose-Capillary: 84 mg/dL (ref 70–99)
Glucose-Capillary: 116 mg/dL — ABNORMAL HIGH (ref 70–99)
Glucose-Capillary: 262 mg/dL — ABNORMAL HIGH (ref 70–99)
Glucose-Capillary: 267 mg/dL — ABNORMAL HIGH (ref 70–99)

## 2022-02-08 LAB — PROTIME-INR
INR: 3.1 — ABNORMAL HIGH (ref 0.8–1.2)
Prothrombin Time: 31.6 seconds — ABNORMAL HIGH (ref 11.4–15.2)

## 2022-02-08 LAB — CBC
HCT: 37.2 % (ref 36.0–46.0)
Hemoglobin: 12.6 g/dL (ref 12.0–15.0)
MCH: 33.1 pg (ref 26.0–34.0)
MCHC: 33.9 g/dL (ref 30.0–36.0)
MCV: 97.6 fL (ref 80.0–100.0)
Platelets: 248 10*3/uL (ref 150–400)
RBC: 3.81 MIL/uL — ABNORMAL LOW (ref 3.87–5.11)
RDW: 15.6 % — ABNORMAL HIGH (ref 11.5–15.5)
WBC: 30.5 10*3/uL — ABNORMAL HIGH (ref 4.0–10.5)
nRBC: 0.1 % (ref 0.0–0.2)

## 2022-02-08 LAB — MAGNESIUM: Magnesium: 1.8 mg/dL (ref 1.7–2.4)

## 2022-02-08 MED ORDER — POLYETHYLENE GLYCOL 3350 17 G PO PACK
17.0000 g | PACK | Freq: Every day | ORAL | Status: DC
  Administered 2022-02-08 – 2022-02-11 (×3): 17 g via ORAL
  Filled 2022-02-08 (×6): qty 1

## 2022-02-08 MED ORDER — DOXYCYCLINE HYCLATE 100 MG PO TABS
100.0000 mg | ORAL_TABLET | Freq: Two times a day (BID) | ORAL | Status: DC
  Administered 2022-02-08 – 2022-02-10 (×6): 100 mg via ORAL
  Filled 2022-02-08 (×6): qty 1

## 2022-02-08 MED ORDER — WARFARIN SODIUM 1 MG PO TABS
1.0000 mg | ORAL_TABLET | Freq: Once | ORAL | Status: AC
  Administered 2022-02-08: 1 mg via ORAL
  Filled 2022-02-08: qty 1

## 2022-02-08 MED ORDER — METHYLPREDNISOLONE SODIUM SUCC 40 MG IJ SOLR
40.0000 mg | Freq: Two times a day (BID) | INTRAMUSCULAR | Status: DC
  Administered 2022-02-08 – 2022-02-09 (×2): 40 mg via INTRAVENOUS
  Filled 2022-02-08 (×2): qty 1

## 2022-02-08 MED ORDER — DOCUSATE SODIUM 100 MG PO CAPS
100.0000 mg | ORAL_CAPSULE | Freq: Two times a day (BID) | ORAL | Status: DC
  Administered 2022-02-08 – 2022-02-13 (×11): 100 mg via ORAL
  Filled 2022-02-08 (×11): qty 1

## 2022-02-08 MED ORDER — METHYLPREDNISOLONE SODIUM SUCC 40 MG IJ SOLR
40.0000 mg | Freq: Once | INTRAMUSCULAR | Status: AC
  Administered 2022-02-08: 40 mg via INTRAVENOUS
  Filled 2022-02-08: qty 1

## 2022-02-08 NOTE — Progress Notes (Signed)
Physical Therapy Treatment Patient Details Name: Brittany Tran MRN: [id number] DOB: 07-09-1960 Today's Date: 02/08/2022   History of Present Illness Patient is a 61 y/o female who presents on 8/18 for generalized weakness and found slumped in her chair minimally responsive by her neighbors. Found to have UTI and sepsis secondary to LLE cellulitis. 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 PMH includes DM, HF, HTN, DVT, fibromyalgia, RA.    PT Comments    Pt tolerates treatment well, transferring multiple times and ambulating for a short distance. PT provides extensive education on transfer technique to reduce assistance requirements. Pt will benefit from aggressive mobilization and continued acute PT services in an effort to restore her prior level of function. PT continues to recommend SNF placement at this time.   Recommendations for follow up therapy are one component of a multi-disciplinary discharge planning process, led by the attending physician.  Recommendations may be updated based on patient status, additional functional criteria and insurance authorization.  Follow Up Recommendations  Skilled nursing-short term rehab (<3 hours/day) Can patient physically be transported by private vehicle: No   Assistance Recommended at Discharge Frequent or constant Supervision/Assistance  Patient can return home with the following A lot of help with walking and/or transfers;Assistance with cooking/housework;Assist for transportation;Help with stairs or ramp for entrance;A little help with bathing/dressing/bathroom   Equipment Recommendations  Rolling walker (2 wheels);BSC/3in1    Recommendations for Other Services       Precautions / Restrictions Precautions Precautions: Fall Restrictions Weight Bearing Restrictions: No     Mobility  Bed Mobility Overal bed mobility: Needs Assistance Bed Mobility: Supine to Sit     Supine to sit: Min assist, HOB elevated     General  bed mobility comments: increased time, use of rails, HOB elevated significantly. Verbal cues for sequencing and assistance for RLE mobility    Transfers Overall transfer level: Needs assistance Equipment used: Rolling walker (2 wheels) Transfers: Sit to/from Stand, Bed to chair/wheelchair/BSC Sit to Stand: Mod assist, Min assist   Step pivot transfers: Min guard       General transfer comment: modA for initial stand from elevated bed, minA from recliner. Verbal cues for hand/foot placement, increased trunk flexion and forward lean    Ambulation/Gait Ambulation/Gait assistance: Min guard Gait Distance (Feet): 4 Feet Assistive device: Rolling walker (2 wheels) Gait Pattern/deviations: Step-to pattern Gait velocity: reduced Gait velocity interpretation: <1.31 ft/sec, indicative of household ambulator   General Gait Details: short step-to gait forward and backward from Dentist    Modified Rankin (Stroke Patients Only)       Balance Overall balance assessment: Needs assistance Sitting-balance support: No upper extremity supported, Feet supported Sitting balance-Leahy Scale: Fair     Standing balance support: Bilateral upper extremity supported, Reliant on assistive device for balance Standing balance-Leahy Scale: Poor                              Cognition Arousal/Alertness: Awake/alert Behavior During Therapy: WFL for tasks assessed/performed Overall Cognitive Status: Within Functional Limits for tasks assessed                                          Exercises      General Comments General  comments (skin integrity, edema, etc.): VSS on RA      Pertinent Vitals/Pain Pain Assessment Pain Assessment: 0-10 Pain Score: 5  Pain Location: all over Pain Descriptors / Indicators: Sore Pain Intervention(s): Monitored during session    Home Living                           Prior Function            PT Goals (current goals can now be found in the care plan section) Acute Rehab PT Goals Patient Stated Goal: to go home Progress towards PT goals: Progressing toward goals    Frequency    Min 3X/week      PT Plan Current plan remains appropriate    Co-evaluation              AM-PAC PT "6 Clicks" Mobility   Outcome Measure  Help needed turning from your back to your side while in a flat bed without using bedrails?: A Little Help needed moving from lying on your back to sitting on the side of a flat bed without using bedrails?: A Little Help needed moving to and from a bed to a chair (including a wheelchair)?: A Little Help needed standing up from a chair using your arms (e.g., wheelchair or bedside chair)?: A Lot Help needed to walk in hospital room?: Total Help needed climbing 3-5 steps with a railing? : Total 6 Click Score: 13    End of Session   Activity Tolerance: Patient tolerated treatment well Patient left: in chair;with call bell/phone within reach;with chair alarm set Nurse Communication: Mobility status PT Visit Diagnosis: Pain;Muscle weakness (generalized) (M62.81);Difficulty in walking, not elsewhere classified (R26.2) Pain - Right/Left:  (all over body) Pain - part of body: Arm;Leg     Time: 6967-8938 PT Time Calculation (min) (ACUTE ONLY): 47 min  Charges:  $Gait Training: 8-22 mins $Therapeutic Activity: 23-37 mins                     Zenaida Niece, PT, DPT Acute Rehabilitation Office [place] 02/08/2022, 3:08 PM

## 2022-02-08 NOTE — Progress Notes (Addendum)
PROGRESS NOTE Brittany Tran  JSE:[id number] DOB: 1961/01/23 DOA: 02/01/2022 PCP: Curt Jews, PA-C   Brief Narrative/Hospital Course:  61 years old female with past medical history of diabetes mellitus type 2, morbid obesity with BMI 43, chronic venous stasis ulceration worse on LLE, was found by neighbors slumped in her chair and minimally responsive after they did not hear from her in 12 hours. EMS was called and  transported to [facility] for evaluation with a concern for UTI and or leg infection and sepsis.In the ED, he was febrile with a temperature of 103.3 F and was tachycardic.  Patient was very confused and disoriented initially.  Patient was then admitted to the hospital for UTI and left lower extremity cellulitis.  Code sepsis was initiated initially.   During hospitalization, CT scan of the abdomen showed possible exophytic mass of the right kidney.  Patient continued to have leg pain and generalized body pain.  MRI of the abdomen showed large renal tumor.  Spoke with urology who recommends a follow-up with Dr. Gilford Rile and Dr. Tresa Moore as outpatient for further evaluation.  Patient also developed erythematous rash diffusely over her body after administration of vancomycin which was changed to cefazolin but he still does have more rash so at this time cefazolin has been discontinued and has been started on doxycycline.  Subjective:  Today, patient was seen and examined at bedside.  Patient complains of pain fatigue weakness.  Has had more redness in the skin and also involving the facial area.  Minimal itching.  No blisters.  Assessment and Plan: Principal Problem:   Cellulitis of left leg Active Problems:   Sepsis due to cellulitis ([place])   Cellulitis of left lower extremity   Diabetes mellitus without complication (HCC)   Chronic diastolic CHF (congestive heart failure) (HCC)   Anticoagulation goal of INR 2 to 3   Renal lesion   Rheumatoid arthritis (HCC)   Hypertension   Skin  yeast infection   Hypokalemia   Sepsis secondary to Left lower extremity cellulitis and UTI, POA: History of chronic venous stasis ulceration.  Patient presented with signs of sepsis.  Seen initially received vancomycin and Rocephin.  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  We will discontinue both and put the patient on doxycycline at this time since she has received 4 more than 5 days.  Blood cultures negative in 5 days.  We will give 1 dose of IV Solu-Medrol.  On prednisone at baseline.  Has leukocytosis likely from steroids.  Temperature max of 101.9 Fahrenheit.  Blood pressure marginally low.  Hold Lasix.  Diffuse erythematous rash over the body including the face.  Mild fever.  Will mention cefazolin as allergy as well.  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  We will transition to doxycycline at this time.  We will add IV Solu-Medrol for 3 doses and as needed Benadryl for now.  Continue to monitor closely.  Acute metabolic encephalopathy.  Present on admission.  Likely multifactorial from sepsis, UTI cellulitis.  Improved at this time.   Exophytic lesion on the anterior aspect of the mid upper right kidney CT abdomen patient with 4.8 x 3.4 x 4.1 centimeter exophytic lesion anterior aspect mid-upper right kidney.  Differential diagnosis was hemorrhagic cyst vs tumor.  Urinalysis showed RBC 11-20 per high-power field. 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 Size of 4.6 x 3.5 x 4.6 cm.  Spoke with urology Dr. Matilde Sprang on 02/06/2022 who recommends following up with Dr. Gilford Rile or Dr. Tresa Moore for further evaluation as outpatient.  We will need to set up an appointment prior to discharge.  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  pharmacy dosing, latest INR of 3.1  Chronic diastolic CHF- w/ mild acute component with orthopnea  Extensive lower leg edema: Has shortness of breath and dyspnea on exertion but is sedentary.  Last 2D echocardiogram f on 02/02/2022 showed LV ejection fraction of 60 to 65% with no regional wall motion abnormality, with grade 1 diastolic dysfunction.  CTA was limited but no evidence of pulmonary embolism but had some vascular congestion.    Patient is mildly positive balance at '3 6 9 '$ mL.  Hold Lasix today due to borderline low blood pressure..   Diabetes mellitus type II on long-term insulin with hyperglycemia today.:  Had hypoglycemia yesterday.  Insulin dose has been reduced at this time.  Latest POC glucose of 116.  Cutaneous yeast infection under panniculus: Continue nystatin powder  Hypertension: Still borderline hypotensive.  Continue to hold antihypertensives, hold Lasix.  Rheumatoid arthritis: Continue prednisone.  Hold methotrexate due to infection.  No acute exacerbation at this time.  Complains of generalized joint and body pain.  We will give 1 dose of IV Solu-Medrol due to erythematous rash.  Morbid obesity BMI 43 Will benefit from ongoing weight loss as outpatient.  Fibromyalgia, generalized body pain.  Continue tramadol and oxycodone for pain management.  Has history of rheumatoid arthritis and chronic pain syndrome.  Deconditioning, debility physical therapy has recommended skilled nursing facility placement.  Patient is in agreement with that.  Mild hypokalemia.  Improved after replacement.  This potassium of 4.0  Mild AKI.  Resolved.  Present on admission.  Latest creatinine of 0.9.  DVT prophylaxis: coumadin  Code Status:   Code Status: Full Code  Family Communication:  I spoke with the patient's mother on the phone and updated her about the clinical condition of the patient.  Patient status is: Inpatient because of need for antibiotics, Debility, weakness, new renal  mass, pending clinical improvement, need for rehabilitation, monitoring fever curve  Level of care: Telemetry Medical   Dispo: The patient is from: home   Anticipated disposition: PT OT recommends skilled nursing facility on discharge.   Objective:  Vitals last 24 hrs: Vitals:   02/07/22 2255 02/08/22 0052 02/08/22 0328 02/08/22 0933  BP: 95/64 (!) 90/48 (!) 84/50 (!) 87/44  Pulse: (!) 101 (!) 104 100 98  Resp: '18 16 18 17  '$ Temp: 99.2 F (37.3 C) 98.7 F (37.1 C) 99.3 F (37.4 C) 99.2 F (37.3 C)  TempSrc: Oral Oral Oral Oral  SpO2: 93% 96% 95% 96%  Weight:      Height:       Physical Examination:  General: Obese built, not in obvious distress, appears weak and deconditioned.  Older than stated age HENT:   No scleral pallor or icterus noted. Oral mucosa is moist.  Chest:  Diminished breath sounds bilaterally. No crackles or wheezes.  CVS: S1 &S2 heard. No murmur.  Regular rate and rhythm. Abdomen: Soft, nontender, nondistended.  Bowel sounds are heard.   Extremities: No cyanosis, clubbing with bilateral chronic venous insufficiency changes with pigmentation and edema.  Peripheral pulses are palpable. Psych: Alert, awake and oriented, normal mood  CNS:  No cranial nerve deficits.  Power equal in all extremities.   Skin: Warm and dry.  Diffuse erythematous rash over the body including the facial area.  Bilateral lower extremity chronic venous changes with cellulitis and left lower extremity dressing.  Medications reviewed:  Scheduled Meds:  docusate sodium  100 mg Oral BID   doxycycline  100 mg Oral Q12H   feeding supplement  237 mL Oral BID BM   folic acid  2 mg Oral Daily   furosemide  20 mg Intravenous Daily   gabapentin  300 mg Oral TID   gabapentin  400 mg Oral QHS   insulin aspart  0-9 Units Subcutaneous TID WC   insulin glargine-yfgn  5 Units Subcutaneous QHS   multivitamin with minerals  1 tablet Oral Daily   nystatin   Topical BID   polyethylene glycol  17 g  Oral Daily   potassium chloride  40 mEq Oral Daily   predniSONE  5 mg Oral Q breakfast   topiramate  25 mg Oral BID   warfarin  1 mg Oral ONCE-1600   Warfarin - Pharmacist Dosing Inpatient   Does not apply q1600   Continuous Infusions:   Data Reviewed:  I have personally reviewed the following labs and imaging studies.    CBC: Recent Labs  Lab 02/04/22 0440 02/05/22 0500 02/06/22 0533 02/07/22 0454 02/08/22 0550  WBC 12.2* 14.9* 18.6* 25.2* 30.5*  HGB 11.8* 11.9* 11.9* 13.6 12.6  HCT 35.5* 34.8* 36.1 41.3 37.2  MCV 99.4 98.3 99.2 99.5 97.6  PLT 153 171 186 233 403    Basic Metabolic Panel: Recent Labs  Lab 02/04/22 0440 02/05/22 0500 02/06/22 0533 02/07/22 0454 02/08/22 0550  NA 139 140 137 136 135  K 3.4* 3.4* 3.4* 4.0 4.0  CL 113* 109 106 104 104  CO2 21* '23 22 22 '$ 21*  GLUCOSE 75 79 54* 68* 81  BUN '13 10 11 14 16  '$ CREATININE 0.70 0.82 0.81 0.94 0.95  CALCIUM 7.2* 7.5* 7.4* 7.7* 7.9*  MG  --  1.9  --   --  1.8    GFR: Estimated Creatinine Clearance: 82.3 mL/min (by C-G formula based on SCr of 0.95 mg/dL). Liver Function Tests: Recent Labs  Lab 02/01/22 1612  AST 27  ALT 11  ALKPHOS 104  BILITOT 1.1  PROT 7.2  ALBUMIN 2.9*    No results for input(s): "LIPASE", "AMYLASE" in the last 168 hours. No results for input(s): "AMMONIA" in the last 168 hours. Coagulation Profile: Recent Labs  Lab 02/03/22 1027 02/04/22 0440 02/06/22 0533 02/07/22 0454 02/08/22 0550  INR 2.6* 2.9* 3.8* 3.6* 3.1*    BNP (last 3 results) No results for input(s): "PROBNP" in the last 8760 hours. HbA1C: No results for input(s): "HGBA1C" in the last 72 hours.  CBG: Recent Labs  Lab 02/07/22 1137 02/07/22 1647 02/07/22 2049 02/08/22 0723 02/08/22 1130  GLUCAP 129* 119* 158* 84 116*    Lipid Profile: No results for input(s): "CHOL", "HDL", "LDLCALC", "TRIG", "CHOLHDL", "LDLDIRECT" in the last 72 hours. Thyroid Function Tests: No results for input(s): "TSH",  "T4TOTAL", "FREET4", "T3FREE", "THYROIDAB" in the last 72 hours. Sepsis Labs: Recent Labs  Lab 02/01/22 1612 02/01/22 1831  LATICACIDVEN 2.4* 1.8     Recent Results (from the past 240 hour(s))  Culture, blood (Routine x 2)     Status: None   Collection Time: 02/01/22  4:03 PM   Specimen: BLOOD  Result Value Ref Range Status  Specimen Description BLOOD LEFT ANTECUBITAL  Final   Special Requests   Final    BOTTLES DRAWN AEROBIC AND ANAEROBIC Blood Culture adequate volume   Culture   Final    NO GROWTH 5 DAYS Performed at [facility], [address], [place], [place] [address]    Report Status 02/06/2022 FINAL  Final  Culture, blood (Routine x 2)     Status: None   Collection Time: 02/01/22  4:11 PM   Specimen: BLOOD  Result Value Ref Range Status   Specimen Description BLOOD RIGHT ANTECUBITAL  Final   Special Requests   Final    BOTTLES DRAWN AEROBIC AND ANAEROBIC Blood Culture results may not be optimal due to an excessive volume of blood received in culture bottles   Culture   Final    NO GROWTH 5 DAYS Performed at [facility], [place] [address] [place], [place] [address]    Report Status 02/06/2022 FINAL  Final  Urine Culture     Status: Abnormal   Collection Time: 02/01/22  4:21 PM   Specimen: In/Out Cath Urine  Result Value Ref Range Status   Specimen Description IN/OUT CATH URINE  Final   Special Requests   Final    NONE Performed at [facility], [place] [address] [place], [place] [address]    Culture 100 COLONIES/mL ESCHERICHIA COLI (A)  Final   Report Status 02/04/2022 FINAL  Final   Organism ID, Bacteria ESCHERICHIA COLI (A)  Final      Susceptibility   Escherichia coli - MIC*    AMPICILLIN 4 SENSITIVE Sensitive     CEFAZOLIN <=4 SENSITIVE Sensitive     CEFEPIME <=0.12 SENSITIVE Sensitive     CEFTRIAXONE <=0.25 SENSITIVE Sensitive     CIPROFLOXACIN <=0.25 SENSITIVE Sensitive     GENTAMICIN <=1 SENSITIVE Sensitive     IMIPENEM <=0.25  SENSITIVE Sensitive     NITROFURANTOIN 32 SENSITIVE Sensitive     TRIMETH/SULFA <=20 SENSITIVE Sensitive     AMPICILLIN/SULBACTAM <=2 SENSITIVE Sensitive     PIP/TAZO <=4 SENSITIVE Sensitive     * 100 COLONIES/mL ESCHERICHIA COLI    Antimicrobials: Anti-infectives (From admission, onward)    Start     Dose/Rate Route Frequency Ordered Stop   02/08/22 1000  doxycycline (VIBRA-TABS) tablet 100 mg        100 mg Oral Every 12 hours 02/08/22 0802     02/03/22 1700  cefTRIAXone (ROCEPHIN) 2 g in sodium chloride 0.9 % 100 mL IVPB  Status:  Discontinued        2 g 200 mL/hr over 30 Minutes Intravenous Every 24 hours 02/03/22 1018 02/07/22 0816   02/02/22 1700  cefTRIAXone (ROCEPHIN) 1 g in sodium chloride 0.9 % 100 mL IVPB  Status:  Discontinued        1 g 200 mL/hr over 30 Minutes Intravenous Every 24 hours 02/02/22 0154 02/03/22 1018   02/02/22 1100  vancomycin (VANCOREADY) IVPB 750 mg/150 mL  Status:  Discontinued        750 mg 75 mL/hr over 120 Minutes Intravenous Every 12 hours 02/01/22 2229 02/06/22 1715   02/01/22 2145  vancomycin (VANCOREADY) IVPB 1750 mg/350 mL        1,750 mg 116.7 mL/hr over 180 Minutes Intravenous  Once 02/01/22 2143 02/02/22 0054   02/01/22 1645  cefTRIAXone (ROCEPHIN) 2 g in sodium chloride 0.9 % 100 mL IVPB        2 g 200 mL/hr over  30 Minutes Intravenous  Once 02/01/22 1633 02/01/22 1843      Radiology Studies: No results found.   LOS: 6 days   Flora Lipps, MD [facility] 02/08/2022, 2:10 PM

## 2022-02-08 NOTE — TOC Progression Note (Signed)
Transition of Care [facility]) - Progression Note    Patient Details  Name: Brittany Tran MRN: [id number] Date of Birth: 1960/09/04  Transition of Care [facility]) CM/SW [place],  Phone Number: 02/08/2022, 2:44 PM  Clinical Narrative:   CSW met with patient to provide bed offers for SNF. Patient asked about possibility of finding SNF closer to her family in [place], [place], and CSW discussed barriers with transportation. Patient to review options available, and CSW to check back with her on choice. CSW to follow.    Expected Discharge Plan: Skilled Nursing Facility Barriers to Discharge: Continued Medical Work up, Ship broker  Expected Discharge Plan and Services Expected Discharge Plan: [place]   Discharge Planning Services: CM Consult Post Acute Care Choice: [place] arrangements for the past 2 months: Apartment                                       Social Determinants of Health (SDOH) Interventions    Readmission Risk Interventions     No data to display

## 2022-02-08 NOTE — Progress Notes (Signed)
[place] for Warfarin Indication:  history of recurrent DVTs  Allergies  Allergen Reactions   Penicillins Hives    Has patient had a PCN reaction causing immediate rash, facial/tongue/throat swelling, SOB or lightheadedness with hypotension: Yes Has patient had a PCN reaction causing severe rash involving mucus membranes or skin necrosis: No Has patient had a PCN reaction that required hospitalization: No Has patient had a PCN reaction occurring within the last 10 years: No If all of the above answers are "NO", then may proceed with Cephalosporin use.    Vancomycin Rash    Developed a red rash bilaterally on her arms, legs, neck, chest and abdomen   Penicillin G Benzathine Rash   Sulfa Antibiotics Hives and Rash    Other reaction(s): Other (See Comments) Other reaction(s): Unknown    Patient Measurements: Height: '5\' 6"'$  (167.6 cm) Weight: 123.4 kg (272 lb 0.8 oz) IBW/kg (Calculated) : 59.3  Vital Signs: Temp: 99.2 F (37.3 C) (08/25 0933) Temp Source: Oral (08/25 0933) BP: 87/44 (08/25 0933) Pulse Rate: 98 (08/25 0933)  Labs: Recent Labs    02/06/22 0533 02/07/22 0454 02/08/22 0550  HGB 11.9* 13.6 12.6  HCT 36.1 41.3 37.2  PLT 186 233 248  LABPROT 36.9* 35.3* 31.6*  INR 3.8* 3.6* 3.1*  CREATININE 0.81 0.94 0.95    Estimated Creatinine Clearance: 82.3 mL/min (by C-G formula based on SCr of 0.95 mg/dL).  Assessment: 61 yo F with medical history significant for history of recurrent DVTs on warfarin PTA who presented minimally responsive suspected to have UTI or cellulitis infection. Last warfarin dose PTA 8/17. Pharmacy consulted to manage warfarin.  PTA warfarin regimen per [facility] visit 01/09/22: - warfarin 5 mg PO Mon, Weds, Fri, Sat - warfarin 2.5 mg PO Tue, Thurs, Sun  INR supratherapeutic x3 days, warfarin dose held 8/23-8/24. INR now 3.1, slightly above goal. Hgb 12.6, platelets 248. No issues bleeding reported.   Goal of  Therapy:  INR 2-3 Monitor platelets by anticoagulation protocol: Yes   Plan:  Give warfarin 1 mg PO x1 dose Check INR daily while on warfarin Continue to monitor H&H and platelets    Thank you for allowing pharmacy to be a part of this patient's care.  Ardyth Harps, PharmD Clinical Pharmacist

## 2022-02-09 DIAGNOSIS — L03116 Cellulitis of left lower limb: Secondary | ICD-10-CM | POA: Diagnosis not present

## 2022-02-09 DIAGNOSIS — I5032 Chronic diastolic (congestive) heart failure: Secondary | ICD-10-CM | POA: Diagnosis not present

## 2022-02-09 DIAGNOSIS — A419 Sepsis, unspecified organism: Secondary | ICD-10-CM | POA: Diagnosis not present

## 2022-02-09 DIAGNOSIS — Z5181 Encounter for therapeutic drug level monitoring: Secondary | ICD-10-CM | POA: Diagnosis not present

## 2022-02-09 LAB — GLUCOSE, CAPILLARY
Glucose-Capillary: 196 mg/dL — ABNORMAL HIGH (ref 70–99)
Glucose-Capillary: 238 mg/dL — ABNORMAL HIGH (ref 70–99)
Glucose-Capillary: 193 mg/dL — ABNORMAL HIGH (ref 70–99)
Glucose-Capillary: 170 mg/dL — ABNORMAL HIGH (ref 70–99)

## 2022-02-09 LAB — CBC
HCT: 35.2 % — ABNORMAL LOW (ref 36.0–46.0)
Hemoglobin: 11.8 g/dL — ABNORMAL LOW (ref 12.0–15.0)
MCH: 33.1 pg (ref 26.0–34.0)
MCHC: 33.5 g/dL (ref 30.0–36.0)
MCV: 98.6 fL (ref 80.0–100.0)
Platelets: 276 10*3/uL (ref 150–400)
RBC: 3.57 MIL/uL — ABNORMAL LOW (ref 3.87–5.11)
RDW: 15.7 % — ABNORMAL HIGH (ref 11.5–15.5)
WBC: 25.9 10*3/uL — ABNORMAL HIGH (ref 4.0–10.5)
nRBC: 0.1 % (ref 0.0–0.2)

## 2022-02-09 LAB — BASIC METABOLIC PANEL
Anion gap: 9 (ref 5–15)
BUN: 22 mg/dL (ref 8–23)
CO2: 23 mmol/L (ref 22–32)
Calcium: 8.1 mg/dL — ABNORMAL LOW (ref 8.9–10.3)
Chloride: 102 mmol/L (ref 98–111)
Creatinine, Ser: 0.77 mg/dL (ref 0.44–1.00)
GFR, Estimated: >60 mL/min (ref >60)
Glucose, Bld: 212 mg/dL — ABNORMAL HIGH (ref 70–99)
Potassium: 4.9 mmol/L (ref 3.5–5.1)
Sodium: 134 mmol/L — ABNORMAL LOW (ref 135–145)

## 2022-02-09 LAB — PROTIME-INR
INR: 2 — ABNORMAL HIGH (ref 0.8–1.2)
Prothrombin Time: 22.8 seconds — ABNORMAL HIGH (ref 11.4–15.2)

## 2022-02-09 LAB — MAGNESIUM: Magnesium: 2.3 mg/dL (ref 1.7–2.4)

## 2022-02-09 MED ORDER — LIP MEDEX EX OINT
TOPICAL_OINTMENT | CUTANEOUS | Status: DC | PRN
  Administered 2022-02-09: 75 via TOPICAL
  Filled 2022-02-09: qty 7

## 2022-02-09 MED ORDER — WARFARIN SODIUM 5 MG PO TABS
5.0000 mg | ORAL_TABLET | Freq: Once | ORAL | Status: AC
  Administered 2022-02-09: 5 mg via ORAL
  Filled 2022-02-09: qty 1

## 2022-02-09 MED ORDER — METHYLPREDNISOLONE SODIUM SUCC 40 MG IJ SOLR
40.0000 mg | Freq: Two times a day (BID) | INTRAMUSCULAR | Status: AC
  Administered 2022-02-09 – 2022-02-10 (×2): 40 mg via INTRAVENOUS
  Filled 2022-02-09 (×2): qty 1

## 2022-02-09 MED ORDER — PREDNISONE 5 MG PO TABS
5.0000 mg | ORAL_TABLET | Freq: Every day | ORAL | Status: DC
  Administered 2022-02-11 – 2022-02-13 (×3): 5 mg via ORAL
  Filled 2022-02-09 (×3): qty 1

## 2022-02-09 NOTE — Progress Notes (Signed)
Mobility Specialist Progress Note   02/09/22 1254  Mobility  Activity Transferred from bed to chair  Level of Assistance Minimal assist, patient does 75% or more  Assistive Device Front wheel walker  Distance Ambulated (ft) 2 ft  Activity Response Tolerated well  $Mobility charge 1 Mobility   Pre-Mobility: 65 HR, 112/63 BP  Received pt in bed c/o minimal pain in LLE and neuropathy in RLE but agreeable. Pt able to get self to EOB, just required increase time. MinA to stand from an elevated surface and minG to stand, pivot turn to chair. Slight cues given for hand placement but no faults throughout. Left in chair w/ call bell in reach and chair alarm on.   Holland Falling Mobility Specialist [place] #:  [phone] Acute Rehab Office:  [phone]

## 2022-02-09 NOTE — Progress Notes (Signed)
[place] for Warfarin Indication:  history of recurrent DVTs  Allergies  Allergen Reactions   Penicillins Hives    Has patient had a PCN reaction causing immediate rash, facial/tongue/throat swelling, SOB or lightheadedness with hypotension: Yes Has patient had a PCN reaction causing severe rash involving mucus membranes or skin necrosis: No Has patient had a PCN reaction that required hospitalization: No Has patient had a PCN reaction occurring within the last 10 years: No If all of the above answers are "NO", then may proceed with Cephalosporin use.    Vancomycin Rash    Developed a red rash bilaterally on her arms, legs, neck, chest and abdomen   Cefazolin Rash   Penicillin G Benzathine Rash   Sulfa Antibiotics Hives and Rash    Other reaction(s): Other (See Comments) Other reaction(s): Unknown    Patient Measurements: Height: '5\' 6"'$  (167.6 cm) Weight: 123.4 kg (272 lb 0.8 oz) IBW/kg (Calculated) : 59.3  Vital Signs: Temp: 98.3 F (36.8 C) (08/26 0455) Temp Source: Oral (08/26 0455) BP: 94/55 (08/26 0455) Pulse Rate: 82 (08/26 0455)  Labs: Recent Labs    02/07/22 0454 02/08/22 0550 02/09/22 0458  HGB 13.6 12.6 11.8*  HCT 41.3 37.2 35.2*  PLT 233 248 276  LABPROT 35.3* 31.6* 22.8*  INR 3.6* 3.1* 2.0*  CREATININE 0.94 0.95 0.77    Estimated Creatinine Clearance: 97.7 mL/min (by C-G formula based on SCr of 0.77 mg/dL).  Assessment: 61 yo F with medical history significant for history of recurrent DVTs on warfarin PTA who presented minimally responsive suspected to have UTI or cellulitis infection. Last warfarin dose PTA 8/17. Pharmacy consulted to manage warfarin.  PTA warfarin regimen per [facility] visit 01/09/22: - warfarin 5 mg PO Mon, Weds, Fri, Sat - warfarin 2.5 mg PO Tue, Thurs, Sun  INR supratherapeutic x3 days, warfarin dose held 8/23-8/24. Warfarin '1mg'$  given 8/25. INR 3.1 > 2.0, at goal. Hgb 11.8, platelets 276. No  issues bleeding reported.    Goal of Therapy:  INR 2-3 Monitor platelets by anticoagulation protocol: Yes   Plan:  Give warfarin 5 mg PO x1 dose Check INR daily while on warfarin Continue to monitor H&H and platelets    Thank you for allowing pharmacy to be a part of this patient's care.  Ardyth Harps, PharmD Clinical Pharmacist

## 2022-02-09 NOTE — Progress Notes (Signed)
PROGRESS NOTE Brittany Tran  HKV:[id number] DOB: May 18, 1961 DOA: 02/01/2022 PCP: Curt Jews, PA-C   Brief Narrative/Hospital Course:  61 years old female with past medical history of diabetes mellitus type 2, morbid obesity with BMI 43, chronic venous stasis ulceration worse on LLE, was found by neighbors slumped in her chair and minimally responsive after they did not hear from her in 12 hours. EMS was called and  transported to [facility] for evaluation with a concern for UTI and or leg infection and sepsis.In the ED, he was febrile with a temperature of 103.3 F and was tachycardic.  Patient was very confused and disoriented initially.  Patient was then admitted to the hospital for UTI and left lower extremity cellulitis.  Code sepsis was initiated initially.   During hospitalization, CT scan of the abdomen showed possible exophytic mass of the right kidney.  Patient continued to have leg pain and generalized body pain.  MRI of the abdomen showed large renal tumor.  Spoke with urology who recommends a follow-up with Dr. Gilford Rile and Dr. Tresa Moore as outpatient for further evaluation.  Patient also developed erythematous rash diffusely over her body after administration of vancomycin which was changed to cefazolin but he still does have more rash so at this time cefazolin has been discontinued and has been started on doxycycline.  Physical therapy has seen the patient and recommended skilled nursing facility at this time.  Assessment and Plan: Principal Problem:   Cellulitis of left leg Active Problems:   Sepsis due to cellulitis ([place])   Cellulitis of left lower extremity   Diabetes mellitus without complication (HCC)   Chronic diastolic CHF (congestive heart failure) (HCC)   Anticoagulation goal of INR 2 to 3   Renal lesion   Rheumatoid arthritis (HCC)   Hypertension   Skin yeast infection   Hypokalemia   Sepsis secondary to Left lower extremity cellulitis and UTI, POA: History of chronic  venous stasis ulceration.  Patient presented with signs of sepsis.  Patient was initially on  vancomycin and Rocephin.   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  Has been transitioned to doxycycline.  Started IV Solu-Medrol 40 twice daily since yesterday due to diffuse rash with as needed antihistamines.   Blood cultures negative in 5 days.  Temperature max of 99.2.  WBC at 20 5K but on steroids.  Blood pressure marginally low at 94/55.  Continue to hold Lasix.  Diffuse erythematous rash over the body including the face.  Mild fever.  Possible allergy to cephalosporins as well.  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 On IV Solu-Medrol for 3 doses and as needed Benadryl for now.  Continue to monitor closely.  Continue Solu-Medrol for 1 more day.  Rash has slightly improved today.  Acute metabolic encephalopathy.  Present on admission.  Likely multifactorial from sepsis, UTI cellulitis.  Improved at this time.   Exophytic lesion on the anterior aspect of the mid upper right kidney CT abdomen patient with 4.8 x 3.4 x 4.1 centimeter exophytic lesion anterior aspect mid-upper right kidney.  Differential diagnosis was hemorrhagic cyst vs tumor.  Urinalysis showed RBC 11-20 per high-power field. 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 Size of 4.6 x 3.5 x 4.6 cm.  Spoke with urology Dr. Matilde Sprang on 02/06/2022 who recommends following up with Dr. Gilford Rile or Dr. Tresa Moore, urology for further evaluation as outpatient.  We will need to set up an appointment prior to discharge.  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 pharmacy dosing, latest INR of 2.0  Chronic diastolic CHF- w/ mild acute component with  orthopnea  Extensive lower leg edema: Has shortness of breath and dyspnea on exertion but is sedentary.  Last 2D echocardiogram f on 02/02/2022 showed LV ejection fraction of 60 to 65% with no regional wall motion abnormality, with grade 1 diastolic dysfunction.  CTA was limited but no evidence of pulmonary embolism but had some vascular congestion.    Patient is mildly positive balance at 409 mL.  Continue to hold Lasix  due to borderline low blood pressure..   Diabetes mellitus type II on long-term insulin with episodes of hypoglycemia and hyperglycemia Insulin dose has been reduced at this time.  Latest POC glucose of 196  Cutaneous yeast infection under panniculus: Continue nystatin powder  Hypertension: Still borderline hypotensive.  Continue to hold antihypertensives, hold Lasix.  Rheumatoid arthritis: Continue prednisone.  Hold methotrexate due to infection, patient was not taking methotrexate since she had not had a follow-up done..    Complains of generalized joint and body pain but no gross joint swelling..  On IV Solu-Medrol for skin flare.  Morbid obesity BMI 43-patient will benefit from ongoing weight loss as outpatient.  Fibromyalgia, generalized body pain, rheumatoid arthritis..  Continue tramadol and oxycodone for pain management.  Has history of rheumatoid arthritis and chronic pain syndrome.  States pain has slightly improved today.  Continue IV steroids for 1 more day.  Deconditioning, debility physical therapy has recommended skilled nursing facility placement.    Mild hypokalemia.  Improved after replacement.  Latest potassium of 4.9  Mild AKI.  Resolved.  Present on admission.  Latest creatinine of 0.7  DVT prophylaxis: coumadin  Code Status:   Code Status: Full Code  Family Communication:  I spoke with the patient's mother on the phone and updated her about the clinical condition of the patient on 02/08/2022.  Patient status is: Inpatient because of need for  antibiotics, Debility, weakness, new renal mass, pending clinical improvement, need for rehabilitation, monitoring fever curve  Level of care: Telemetry Medical   Dispo: The patient is from: home   Anticipated disposition: PT OT recommends skilled nursing facility on discharge.    Subjective:  Today, patient was seen and examined at bedside.  Patient feels little stronger today.  Has better control of her pain.  Rash has slightly improved.   Objective:  Vitals last 24 hrs: Vitals:   02/08/22 1418 02/08/22 1631 02/08/22 2051 02/09/22 0455  BP: (!) 96/46 (!) 101/58 (!) 114/56 (!) 94/55  Pulse: 97 90 87 82  Resp:  '18 19 18  '$ Temp:  98.7 F (37.1 C) 98.7 F (37.1 C) 98.3 F (36.8 C)  TempSrc:   Oral Oral  SpO2:  96% 92% 93%  Weight:      Height:       Physical Examination:  General: Obese built, not in obvious distress deconditioned and weak, appears older than stated age HENT:   No scleral pallor or icterus noted. Oral mucosa is moist.  Chest:   Diminished breath sounds bilaterally. No crackles or wheezes.  CVS: S1 &S2 heard. No murmur.  Regular rate and rhythm. Abdomen: Soft, nontender, nondistended.  Bowel sounds are heard.   Extremities: No cyanosis,  clubbing bilateral lower extremity venous insufficiency changes with pigmentation and edema, left leg with ulcer with dressing peripheral pulses are palpable.  Psych: Alert, awake and oriented, normal mood CNS:  No cranial nerve deficits.  Power equal in all extremities.   Skin: Warm and dry.  Bilateral lower extremity chronic venous stasis with left lower extremity ulcer and dressing.  Erythematous diffuse rash over the body and face has slightly lightened up.  Medications reviewed:  Scheduled Meds:  docusate sodium  100 mg Oral BID   doxycycline  100 mg Oral Q12H   feeding supplement  237 mL Oral BID BM   folic acid  2 mg Oral Daily   furosemide  20 mg Intravenous Daily   gabapentin  300 mg Oral TID   gabapentin  400 mg  Oral QHS   insulin aspart  0-9 Units Subcutaneous TID WC   insulin glargine-yfgn  5 Units Subcutaneous QHS   methylPREDNISolone (SOLU-MEDROL) injection  40 mg Intravenous Q12H   multivitamin with minerals  1 tablet Oral Daily   nystatin   Topical BID   polyethylene glycol  17 g Oral Daily   potassium chloride  40 mEq Oral Daily   predniSONE  5 mg Oral Q breakfast   topiramate  25 mg Oral BID   Warfarin - Pharmacist Dosing Inpatient   Does not apply q1600   Continuous Infusions:   Data Reviewed:  I have personally reviewed the following labs and imaging studies.    CBC: Recent Labs  Lab 02/05/22 0500 02/06/22 0533 02/07/22 0454 02/08/22 0550 02/09/22 0458  WBC 14.9* 18.6* 25.2* 30.5* 25.9*  HGB 11.9* 11.9* 13.6 12.6 11.8*  HCT 34.8* 36.1 41.3 37.2 35.2*  MCV 98.3 99.2 99.5 97.6 98.6  PLT 171 186 233 248 295    Basic Metabolic Panel: Recent Labs  Lab 02/05/22 0500 02/06/22 0533 02/07/22 0454 02/08/22 0550 02/09/22 0458  NA 140 137 136 135 134*  K 3.4* 3.4* 4.0 4.0 4.9  CL 109 106 104 104 102  CO2 '23 22 22 '$ 21* 23  GLUCOSE 79 54* 68* 81 212*  BUN '10 11 14 16 22  '$ CREATININE 0.82 0.81 0.94 0.95 0.77  CALCIUM 7.5* 7.4* 7.7* 7.9* 8.1*  MG 1.9  --   --  1.8 2.3    GFR: Estimated Creatinine Clearance: 97.7 mL/min (by C-G formula based on SCr of 0.77 mg/dL). Liver Function Tests: No results for input(s): "AST", "ALT", "ALKPHOS", "BILITOT", "PROT", "ALBUMIN" in the last 168 hours.  No results for input(s): "LIPASE", "AMYLASE" in the last 168 hours. No results for input(s): "AMMONIA" in the last 168 hours. Coagulation Profile: Recent Labs  Lab 02/04/22 0440 02/06/22 0533 02/07/22 0454 02/08/22 0550 02/09/22 0458  INR 2.9* 3.8* 3.6* 3.1* 2.0*    BNP (last 3 results) No results for input(s): "PROBNP" in the last 8760 hours. HbA1C: No results for input(s): "HGBA1C" in the last 72 hours.  CBG: Recent Labs  Lab 02/08/22 0723 02/08/22 1130 02/08/22 1618  02/08/22 2052 02/09/22 0725  GLUCAP 84 116* 262* 267* 196*    Lipid Profile: No results for input(s): "CHOL", "HDL", "LDLCALC", "TRIG", "CHOLHDL", "LDLDIRECT" in the last 72 hours. Thyroid Function Tests: No results for input(s): "TSH", "T4TOTAL", "FREET4", "T3FREE", "THYROIDAB" in the last 72 hours. Sepsis Labs: No results for input(s): "PROCALCITON", "LATICACIDVEN" in the last 168 hours.   Recent Results (from the past 240 hour(s))  Culture, blood (Routine x 2)     Status: None   Collection  Time: 02/01/22  4:03 PM   Specimen: BLOOD  Result Value Ref Range Status   Specimen Description BLOOD LEFT ANTECUBITAL  Final   Special Requests   Final    BOTTLES DRAWN AEROBIC AND ANAEROBIC Blood Culture adequate volume   Culture   Final    NO GROWTH 5 DAYS Performed at [facility], [address], [place], [place] [address]    Report Status 02/06/2022 FINAL  Final  Culture, blood (Routine x 2)     Status: None   Collection Time: 02/01/22  4:11 PM   Specimen: BLOOD  Result Value Ref Range Status   Specimen Description BLOOD RIGHT ANTECUBITAL  Final   Special Requests   Final    BOTTLES DRAWN AEROBIC AND ANAEROBIC Blood Culture results may not be optimal due to an excessive volume of blood received in culture bottles   Culture   Final    NO GROWTH 5 DAYS Performed at [facility], [place] [address], [place], [place] [address]    Report Status 02/06/2022 FINAL  Final  Urine Culture     Status: Abnormal   Collection Time: 02/01/22  4:21 PM   Specimen: In/Out Cath Urine  Result Value Ref Range Status   Specimen Description IN/OUT CATH URINE  Final   Special Requests   Final    NONE Performed at [facility], [place] [address], [place], [place] [address]    Culture 100 COLONIES/mL ESCHERICHIA COLI (A)  Final   Report Status 02/04/2022 FINAL  Final   Organism ID, Bacteria ESCHERICHIA COLI (A)  Final      Susceptibility   Escherichia coli - MIC*    AMPICILLIN 4  SENSITIVE Sensitive     CEFAZOLIN <=4 SENSITIVE Sensitive     CEFEPIME <=0.12 SENSITIVE Sensitive     CEFTRIAXONE <=0.25 SENSITIVE Sensitive     CIPROFLOXACIN <=0.25 SENSITIVE Sensitive     GENTAMICIN <=1 SENSITIVE Sensitive     IMIPENEM <=0.25 SENSITIVE Sensitive     NITROFURANTOIN 32 SENSITIVE Sensitive     TRIMETH/SULFA <=20 SENSITIVE Sensitive     AMPICILLIN/SULBACTAM <=2 SENSITIVE Sensitive     PIP/TAZO <=4 SENSITIVE Sensitive     * 100 COLONIES/mL ESCHERICHIA COLI    Antimicrobials: Anti-infectives (From admission, onward)    Start     Dose/Rate Route Frequency Ordered Stop   02/08/22 1000  doxycycline (VIBRA-TABS) tablet 100 mg        100 mg Oral Every 12 hours 02/08/22 0802     02/03/22 1700  cefTRIAXone (ROCEPHIN) 2 g in sodium chloride 0.9 % 100 mL IVPB  Status:  Discontinued        2 g 200 mL/hr over 30 Minutes Intravenous Every 24 hours 02/03/22 1018 02/07/22 0816   02/02/22 1700  cefTRIAXone (ROCEPHIN) 1 g in sodium chloride 0.9 % 100 mL IVPB  Status:  Discontinued        1 g 200 mL/hr over 30 Minutes Intravenous Every 24 hours 02/02/22 0154 02/03/22 1018   02/02/22 1100  vancomycin (VANCOREADY) IVPB 750 mg/150 mL  Status:  Discontinued        750 mg 75 mL/hr over 120 Minutes Intravenous Every 12 hours 02/01/22 2229 02/06/22 1715   02/01/22 2145  vancomycin (VANCOREADY) IVPB 1750 mg/350 mL        1,750 mg 116.7 mL/hr over 180 Minutes Intravenous  Once 02/01/22 2143 02/02/22 0054   02/01/22 1645  cefTRIAXone (ROCEPHIN) 2 g in sodium chloride  0.9 % 100 mL IVPB        2 g 200 mL/hr over 30 Minutes Intravenous  Once 02/01/22 1633 02/01/22 1843      Radiology Studies: No results found.   LOS: 7 days   Flora Lipps, MD [facility] 02/09/2022, 8:17 AM

## 2022-02-10 DIAGNOSIS — A419 Sepsis, unspecified organism: Secondary | ICD-10-CM | POA: Diagnosis not present

## 2022-02-10 DIAGNOSIS — I5032 Chronic diastolic (congestive) heart failure: Secondary | ICD-10-CM | POA: Diagnosis not present

## 2022-02-10 DIAGNOSIS — L03116 Cellulitis of left lower limb: Secondary | ICD-10-CM | POA: Diagnosis not present

## 2022-02-10 DIAGNOSIS — Z5181 Encounter for therapeutic drug level monitoring: Secondary | ICD-10-CM | POA: Diagnosis not present

## 2022-02-10 LAB — PROTIME-INR
INR: 2.1 — ABNORMAL HIGH (ref 0.8–1.2)
Prothrombin Time: 23.3 seconds — ABNORMAL HIGH (ref 11.4–15.2)

## 2022-02-10 LAB — GLUCOSE, CAPILLARY
Glucose-Capillary: 199 mg/dL — ABNORMAL HIGH (ref 70–99)
Glucose-Capillary: 188 mg/dL — ABNORMAL HIGH (ref 70–99)
Glucose-Capillary: 215 mg/dL — ABNORMAL HIGH (ref 70–99)
Glucose-Capillary: 235 mg/dL — ABNORMAL HIGH (ref 70–99)
Glucose-Capillary: 225 mg/dL — ABNORMAL HIGH (ref 70–99)

## 2022-02-10 LAB — CBC
HCT: 36.2 % (ref 36.0–46.0)
Hemoglobin: 12 g/dL (ref 12.0–15.0)
MCH: 32.6 pg (ref 26.0–34.0)
MCHC: 33.1 g/dL (ref 30.0–36.0)
MCV: 98.4 fL (ref 80.0–100.0)
Platelets: 291 10*3/uL (ref 150–400)
RBC: 3.68 MIL/uL — ABNORMAL LOW (ref 3.87–5.11)
RDW: 15.5 % (ref 11.5–15.5)
WBC: 17.5 10*3/uL — ABNORMAL HIGH (ref 4.0–10.5)
nRBC: 0 % (ref 0.0–0.2)

## 2022-02-10 LAB — BASIC METABOLIC PANEL
Anion gap: 7 (ref 5–15)
BUN: 28 mg/dL — ABNORMAL HIGH (ref 8–23)
CO2: 24 mmol/L (ref 22–32)
Calcium: 7.9 mg/dL — ABNORMAL LOW (ref 8.9–10.3)
Chloride: 102 mmol/L (ref 98–111)
Creatinine, Ser: 0.72 mg/dL (ref 0.44–1.00)
GFR, Estimated: >60 mL/min (ref >60)
Glucose, Bld: 211 mg/dL — ABNORMAL HIGH (ref 70–99)
Potassium: 5.2 mmol/L — ABNORMAL HIGH (ref 3.5–5.1)
Sodium: 133 mmol/L — ABNORMAL LOW (ref 135–145)

## 2022-02-10 LAB — MAGNESIUM: Magnesium: 2.3 mg/dL (ref 1.7–2.4)

## 2022-02-10 MED ORDER — WARFARIN SODIUM 2.5 MG PO TABS
2.5000 mg | ORAL_TABLET | Freq: Once | ORAL | Status: AC
  Administered 2022-02-10: 2.5 mg via ORAL
  Filled 2022-02-10: qty 1

## 2022-02-10 NOTE — Progress Notes (Signed)
PROGRESS NOTE Brittany Tran  TGP:[id number] DOB: [birth year] DOA: 02/01/2022 PCP: Curt Jews, PA-C   Brief Narrative/Hospital Course:  61 years old female with past medical history of diabetes mellitus type 2, morbid obesity with BMI 43, chronic venous stasis ulceration worse on LLE, was found by neighbors slumped in her chair and minimally responsive after they did not hear from her in 12 hours. EMS was called and  transported to [facility] for evaluation with a concern for UTI and or leg infection and sepsis.In the ED, he was febrile with a temperature of 103.3 F and was tachycardic.  Patient was very confused and disoriented initially.  Patient was then admitted to the hospital for UTI and left lower extremity cellulitis.  Code sepsis was initiated initially.   During hospitalization, CT scan of the abdomen showed possible exophytic mass of the right kidney.  Patient continued to have leg pain and generalized body pain.  MRI of the abdomen showed large renal tumor.  Spoke with urology who recommends a follow-up with Dr. Gilford Rile and Dr. Tresa Moore as outpatient for further evaluation.  Patient also developed erythematous rash diffusely over her body after administration of vancomycin which was changed to cefazolin but he still does have more rash so at this time cefazolin has been discontinued and has been started on doxycycline.  Physical therapy has seen the patient and recommended skilled nursing facility at this time.  Assessment and Plan: Principal Problem:   Cellulitis of left leg Active Problems:   Sepsis due to cellulitis ([place])   Cellulitis of left lower extremity   Diabetes mellitus without complication (HCC)   Chronic diastolic CHF (congestive heart failure) (HCC)   Anticoagulation goal of INR 2 to 3   Renal lesion   Rheumatoid arthritis (HCC)   Hypertension   Skin yeast infection   Hypokalemia   Sepsis secondary to Left lower extremity cellulitis and UTI, POA: History of chronic  venous stasis ulceration.  Patient presented with signs of sepsis.  Patient was initially on  vancomycin and Rocephin.   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  Has been transitioned to doxycycline.  On IV Solu-Medrol and antihistamines.   Blood cultures negative in >5 days.  Temperature max of 99.2.  WBC at 17.5 from 20.2 K but on steroids.  Blood pressure has improved at this time to 113/66.  Diffuse erythematous rash.  Mild scaling and exfoliation today no mucosal involvement.  Patient feels better.  Redness has improved but with scaling of skin. Enrigue Catena allergy to cephalosporins as well.  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 On doxycycline at this time. On IV Solu-Medrol.  Improving rash.  Acute metabolic encephalopathy.  Present on admission.  Likely multifactorial from sepsis, UTI cellulitis.  Improved at this time.   Exophytic lesion on the anterior aspect of the mid upper right kidney CT abdomen patient with 4.8 x 3.4 x 4.1 centimeter exophytic lesion anterior aspect mid-upper right kidney.  Differential diagnosis was hemorrhagic cyst vs tumor.  Urinalysis showed RBC 11-20 per high-power field. 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 Size of 4.6 x 3.5 x 4.6 cm.  Spoke with urology Dr. Matilde Sprang on 02/06/2022 who recommends following up with Dr. Gilford Rile or Dr. Tresa Moore, urology for further evaluation  as outpatient.  We will need to set up an appointment prior to discharge.  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 pharmacy dosing, latest INR of 2.1  Chronic diastolic CHF- w/ mild acute component with orthopnea  Extensive lower leg edema: Has shortness of breath and dyspnea on exertion but is sedentary.  Last 2D  echocardiogram f on 02/02/2022 showed LV ejection fraction of 60 to 65% with no regional wall motion abnormality, with grade 1 diastolic dysfunction.  CTA was limited but no evidence of pulmonary embolism but had some vascular congestion.    Patient is negative balance for 421 mL..  Continue to hold Lasix  due to borderline low blood pressure..   Diabetes mellitus type II on long-term insulin with episodes of hypoglycemia and hyperglycemia Insulin dose has been reduced at this time.  Latest POC glucose of 199  Cutaneous yeast infection under panniculus: Continue nystatin powder  Hypertension: Improved today.  Continue to hold antihypertensives, hold Lasix.  Rheumatoid arthritis: Continue prednisone after IV Solu-Medrol..  Hold methotrexate due to infection, patient was not taking methotrexate since she had not had a follow-up done  Morbid obesity BMI 43-patient will benefit from ongoing weight loss as outpatient.  Fibromyalgia, generalized body pain, rheumatoid arthritis..  Continue tramadol and oxycodone for pain management.  Has history of rheumatoid arthritis and chronic pain syndrome.  On IV steroids at this time with improvement.  Deconditioning, debility physical therapy has recommended skilled nursing facility placement.    Mild hypokalemia.  Improved after replacement.  Potassium level of 5.2 today.  We will discontinue supplements.  Mild AKI.  Resolved.  Present on admission.  Latest creatinine of 0.7  DVT prophylaxis: coumadin  Code Status:   Code Status: Full Code  Family Communication:  I spoke with the patient's mother on the phone and updated her about the clinical condition of the patient on 02/08/2022.  Patient status is: Inpatient because of need for antibiotics, Debility, weakness, new renal mass, pending clinical improvement, need for rehabilitation,   Level of care: Telemetry Medical   Dispo: The patient is from: home   Anticipated disposition: PT OT recommends  skilled nursing facility on discharge.    Subjective:  Today, patient was seen and examined at bedside.  Patient feels better today.  Denies any fever, chills, nausea or vomiting.  No oral ulcerations.  Erythema has decreased but complains of scaling of the skin.  Objective:  Vitals last 24 hrs: Vitals:   02/09/22 1200 02/09/22 1635 02/09/22 2104 02/10/22 0446  BP: 112/63 116/65 123/72 113/66  Pulse: 65 68 71 67  Resp: '18 18 18 18  '$ Temp: 97.8 F (36.6 C) 97.9 F (36.6 C) 98.1 F (36.7 C) 98.2 F (36.8 C)  TempSrc: Oral Oral Oral Oral  SpO2:   97% 96%  Weight:      Height:       Physical Examination:  General: Obese built, not in obvious distress HENT:   No scleral pallor or icterus noted. Oral mucosa is moist.  Chest:   Diminished breath sounds bilaterally. No crackles or wheezes.  CVS: S1 &S2 heard. No murmur.  Regular rate and rhythm. Abdomen: Soft, nontender, nondistended.  Bowel sounds are heard.   Extremities: No cyanosis, clubbing or edema.  Peripheral pulses are palpable.  Left leg ulcer with cellulitis Psych: Alert, awake and oriented, normal mood CNS:  No cranial nerve deficits.  Power equal in all extremities.   Skin: Warm and dry.  Left leg ulcer with  cellulitis, chronic venous stasis changes bilateral lower extremity, erythematous rash over the body and face with some scaling..  Medications reviewed:  Scheduled Meds:  docusate sodium  100 mg Oral BID   doxycycline  100 mg Oral Q12H   feeding supplement  237 mL Oral BID BM   folic acid  2 mg Oral Daily   furosemide  20 mg Intravenous Daily   gabapentin  300 mg Oral TID   gabapentin  400 mg Oral QHS   insulin aspart  0-9 Units Subcutaneous TID WC   insulin glargine-yfgn  5 Units Subcutaneous QHS   methylPREDNISolone (SOLU-MEDROL) injection  40 mg Intravenous Q12H   multivitamin with minerals  1 tablet Oral Daily   nystatin   Topical BID   polyethylene glycol  17 g Oral Daily   potassium chloride  40 mEq  Oral Daily   [START ON 02/11/2022] predniSONE  5 mg Oral Q breakfast   topiramate  25 mg Oral BID   Warfarin - Pharmacist Dosing Inpatient   Does not apply q1600   Continuous Infusions:   Data Reviewed:  I have personally reviewed the following labs and imaging studies.    CBC: Recent Labs  Lab 02/06/22 0533 02/07/22 0454 02/08/22 0550 02/09/22 0458 02/10/22 0229  WBC 18.6* 25.2* 30.5* 25.9* 17.5*  HGB 11.9* 13.6 12.6 11.8* 12.0  HCT 36.1 41.3 37.2 35.2* 36.2  MCV 99.2 99.5 97.6 98.6 98.4  PLT 186 233 248 276 702    Basic Metabolic Panel: Recent Labs  Lab 02/05/22 0500 02/06/22 0533 02/07/22 0454 02/08/22 0550 02/09/22 0458 02/10/22 0229  NA 140 137 136 135 134* 133*  K 3.4* 3.4* 4.0 4.0 4.9 5.2*  CL 109 106 104 104 102 102  CO2 '23 22 22 '$ 21* 23 24  GLUCOSE 79 54* 68* 81 212* 211*  BUN '10 11 14 16 22 '$ 28*  CREATININE 0.82 0.81 0.94 0.95 0.77 0.72  CALCIUM 7.5* 7.4* 7.7* 7.9* 8.1* 7.9*  MG 1.9  --   --  1.8 2.3 2.3    GFR: Estimated Creatinine Clearance: 97.7 mL/min (by C-G formula based on SCr of 0.72 mg/dL). Liver Function Tests: No results for input(s): "AST", "ALT", "ALKPHOS", "BILITOT", "PROT", "ALBUMIN" in the last 168 hours.  No results for input(s): "LIPASE", "AMYLASE" in the last 168 hours. No results for input(s): "AMMONIA" in the last 168 hours. Coagulation Profile: Recent Labs  Lab 02/06/22 0533 02/07/22 0454 02/08/22 0550 02/09/22 0458 02/10/22 0229  INR 3.8* 3.6* 3.1* 2.0* 2.1*    BNP (last 3 results) No results for input(s): "PROBNP" in the last 8760 hours. HbA1C: No results for input(s): "HGBA1C" in the last 72 hours.  CBG: Recent Labs  Lab 02/09/22 0725 02/09/22 1118 02/09/22 1636 02/09/22 2105 02/10/22 0727  GLUCAP 196* 238* 193* 170* 199*    Lipid Profile: No results for input(s): "CHOL", "HDL", "LDLCALC", "TRIG", "CHOLHDL", "LDLDIRECT" in the last 72 hours. Thyroid Function Tests: No results for input(s): "TSH",  "T4TOTAL", "FREET4", "T3FREE", "THYROIDAB" in the last 72 hours. Sepsis Labs: No results for input(s): "PROCALCITON", "LATICACIDVEN" in the last 168 hours.   Recent Results (from the past 240 hour(s))  Culture, blood (Routine x 2)     Status: None   Collection Time: 02/01/22  4:03 PM   Specimen: BLOOD  Result Value Ref Range Status   Specimen Description BLOOD LEFT ANTECUBITAL  Final   Special Requests   Final    BOTTLES DRAWN AEROBIC AND ANAEROBIC Blood Culture adequate  volume   Culture   Final    NO GROWTH 5 DAYS Performed at [facility], [place] [address], [place], [place] [address]    Report Status 02/06/2022 FINAL  Final  Culture, blood (Routine x 2)     Status: None   Collection Time: 02/01/22  4:11 PM   Specimen: BLOOD  Result Value Ref Range Status   Specimen Description BLOOD RIGHT ANTECUBITAL  Final   Special Requests   Final    BOTTLES DRAWN AEROBIC AND ANAEROBIC Blood Culture results may not be optimal due to an excessive volume of blood received in culture bottles   Culture   Final    NO GROWTH 5 DAYS Performed at [facility], [place] [address], [place], [place] [address]    Report Status 02/06/2022 FINAL  Final  Urine Culture     Status: Abnormal   Collection Time: 02/01/22  4:21 PM   Specimen: In/Out Cath Urine  Result Value Ref Range Status   Specimen Description IN/OUT CATH URINE  Final   Special Requests   Final    NONE Performed at [facility], [place] [address], [place], [place] [address]    Culture 100 COLONIES/mL ESCHERICHIA COLI (A)  Final   Report Status 02/04/2022 FINAL  Final   Organism ID, Bacteria ESCHERICHIA COLI (A)  Final      Susceptibility   Escherichia coli - MIC*    AMPICILLIN 4 SENSITIVE Sensitive     CEFAZOLIN <=4 SENSITIVE Sensitive     CEFEPIME <=0.12 SENSITIVE Sensitive     CEFTRIAXONE <=0.25 SENSITIVE Sensitive     CIPROFLOXACIN <=0.25 SENSITIVE Sensitive     GENTAMICIN <=1 SENSITIVE Sensitive     IMIPENEM <=0.25  SENSITIVE Sensitive     NITROFURANTOIN 32 SENSITIVE Sensitive     TRIMETH/SULFA <=20 SENSITIVE Sensitive     AMPICILLIN/SULBACTAM <=2 SENSITIVE Sensitive     PIP/TAZO <=4 SENSITIVE Sensitive     * 100 COLONIES/mL ESCHERICHIA COLI    Antimicrobials: Anti-infectives (From admission, onward)    Start     Dose/Rate Route Frequency Ordered Stop   02/08/22 1000  doxycycline (VIBRA-TABS) tablet 100 mg        100 mg Oral Every 12 hours 02/08/22 0802     02/03/22 1700  cefTRIAXone (ROCEPHIN) 2 g in sodium chloride 0.9 % 100 mL IVPB  Status:  Discontinued        2 g 200 mL/hr over 30 Minutes Intravenous Every 24 hours 02/03/22 1018 02/07/22 0816   02/02/22 1700  cefTRIAXone (ROCEPHIN) 1 g in sodium chloride 0.9 % 100 mL IVPB  Status:  Discontinued        1 g 200 mL/hr over 30 Minutes Intravenous Every 24 hours 02/02/22 0154 02/03/22 1018   02/02/22 1100  vancomycin (VANCOREADY) IVPB 750 mg/150 mL  Status:  Discontinued        750 mg 75 mL/hr over 120 Minutes Intravenous Every 12 hours 02/01/22 2229 02/06/22 1715   02/01/22 2145  vancomycin (VANCOREADY) IVPB 1750 mg/350 mL        1,750 mg 116.7 mL/hr over 180 Minutes Intravenous  Once 02/01/22 2143 02/02/22 0054   02/01/22 1645  cefTRIAXone (ROCEPHIN) 2 g in sodium chloride 0.9 % 100 mL IVPB        2 g 200 mL/hr over 30 Minutes Intravenous  Once 02/01/22 1633 02/01/22 1843      Radiology Studies: No results found.   LOS: 8 days  Flora Lipps, MD [facility] 02/10/2022, 8:34 AM

## 2022-02-10 NOTE — Progress Notes (Signed)
[place] for Warfarin Indication:  history of recurrent DVTs  Allergies  Allergen Reactions   Penicillins Hives    Has patient had a PCN reaction causing immediate rash, facial/tongue/throat swelling, SOB or lightheadedness with hypotension: Yes Has patient had a PCN reaction causing severe rash involving mucus membranes or skin necrosis: No Has patient had a PCN reaction that required hospitalization: No Has patient had a PCN reaction occurring within the last 10 years: No If all of the above answers are "NO", then may proceed with Cephalosporin use.    Vancomycin Rash    Developed a red rash bilaterally on her arms, legs, neck, chest and abdomen   Cefazolin Rash   Penicillin G Benzathine Rash   Sulfa Antibiotics Hives and Rash    Other reaction(s): Other (See Comments) Other reaction(s): Unknown    Patient Measurements: Height: '5\' 6"'$  (167.6 cm) Weight: 123.4 kg (272 lb 0.8 oz) IBW/kg (Calculated) : 59.3  Vital Signs: Temp: 98.4 F (36.9 C) (08/27 1024) Temp Source: Oral (08/27 0446) BP: 107/64 (08/27 1024) Pulse Rate: 69 (08/27 1024)  Labs: Recent Labs    02/08/22 0550 02/09/22 0458 02/10/22 0229  HGB 12.6 11.8* 12.0  HCT 37.2 35.2* 36.2  PLT 248 276 291  LABPROT 31.6* 22.8* 23.3*  INR 3.1* 2.0* 2.1*  CREATININE 0.95 0.77 0.72    Estimated Creatinine Clearance: 97.7 mL/min (by C-G formula based on SCr of 0.72 mg/dL).  Assessment: 61 yo F with medical history significant for history of recurrent DVTs on warfarin PTA who presented minimally responsive suspected to have UTI or cellulitis infection. Last warfarin dose PTA 8/17. Pharmacy consulted to manage warfarin.  PTA warfarin regimen per [facility] visit 01/09/22: - warfarin 5 mg PO Mon, Weds, Fri, Sat - warfarin 2.5 mg PO Tue, Thurs, Sun  INR supratherapeutic x3 days, warfarin dose held 8/23-8/24. Warfarin '1mg'$  given 8/25. INR 3.1 > 2.1, within goal. CBC stable, no issues with  bleeding reported.    Goal of Therapy:  INR 2-3 Monitor platelets by anticoagulation protocol: Yes   Plan:  Give warfarin 2.5 mg PO x1 dose Check INR daily while on warfarin Continue to monitor H&H and platelets    Thank you for allowing pharmacy to be a part of this patient's care.  Ardyth Harps, PharmD Clinical Pharmacist

## 2022-02-11 DIAGNOSIS — L03116 Cellulitis of left lower limb: Secondary | ICD-10-CM | POA: Diagnosis not present

## 2022-02-11 DIAGNOSIS — I5032 Chronic diastolic (congestive) heart failure: Secondary | ICD-10-CM | POA: Diagnosis not present

## 2022-02-11 DIAGNOSIS — Z5181 Encounter for therapeutic drug level monitoring: Secondary | ICD-10-CM | POA: Diagnosis not present

## 2022-02-11 DIAGNOSIS — A419 Sepsis, unspecified organism: Secondary | ICD-10-CM | POA: Diagnosis not present

## 2022-02-11 LAB — CBC
HCT: 35 % — ABNORMAL LOW (ref 36.0–46.0)
Hemoglobin: 11.7 g/dL — ABNORMAL LOW (ref 12.0–15.0)
MCH: 32.6 pg (ref 26.0–34.0)
MCHC: 33.4 g/dL (ref 30.0–36.0)
MCV: 97.5 fL (ref 80.0–100.0)
Platelets: 279 10*3/uL (ref 150–400)
RBC: 3.59 MIL/uL — ABNORMAL LOW (ref 3.87–5.11)
RDW: 15.2 % (ref 11.5–15.5)
WBC: 9.6 10*3/uL (ref 4.0–10.5)
nRBC: 0 % (ref 0.0–0.2)

## 2022-02-11 LAB — PROTIME-INR
INR: 2.4 — ABNORMAL HIGH (ref 0.8–1.2)
Prothrombin Time: 25.7 seconds — ABNORMAL HIGH (ref 11.4–15.2)

## 2022-02-11 LAB — BASIC METABOLIC PANEL
Anion gap: 9 (ref 5–15)
BUN: 28 mg/dL — ABNORMAL HIGH (ref 8–23)
CO2: 24 mmol/L (ref 22–32)
Calcium: 7.7 mg/dL — ABNORMAL LOW (ref 8.9–10.3)
Chloride: 103 mmol/L (ref 98–111)
Creatinine, Ser: 0.66 mg/dL (ref 0.44–1.00)
GFR, Estimated: >60 mL/min (ref >60)
Glucose, Bld: 216 mg/dL — ABNORMAL HIGH (ref 70–99)
Potassium: 3.9 mmol/L (ref 3.5–5.1)
Sodium: 136 mmol/L (ref 135–145)

## 2022-02-11 LAB — GLUCOSE, CAPILLARY
Glucose-Capillary: 137 mg/dL — ABNORMAL HIGH (ref 70–99)
Glucose-Capillary: 165 mg/dL — ABNORMAL HIGH (ref 70–99)
Glucose-Capillary: 208 mg/dL — ABNORMAL HIGH (ref 70–99)
Glucose-Capillary: 233 mg/dL — ABNORMAL HIGH (ref 70–99)

## 2022-02-11 MED ORDER — ORAL CARE MOUTH RINSE: 15.0000 mL | OROMUCOSAL | Status: DC | PRN

## 2022-02-11 MED ORDER — WARFARIN SODIUM 2.5 MG PO TABS
2.5000 mg | ORAL_TABLET | Freq: Once | ORAL | Status: AC
  Administered 2022-02-11: 2.5 mg via ORAL
  Filled 2022-02-11: qty 1

## 2022-02-11 MED ORDER — METHYLPREDNISOLONE SODIUM SUCC 40 MG IJ SOLR
40.0000 mg | Freq: Once | INTRAMUSCULAR | Status: AC
  Administered 2022-02-11: 40 mg via INTRAVENOUS
  Filled 2022-02-11: qty 1

## 2022-02-11 MED ORDER — BISACODYL 10 MG RE SUPP
10.0000 mg | Freq: Every day | RECTAL | Status: DC
  Filled 2022-02-11 (×3): qty 1

## 2022-02-11 NOTE — TOC Progression Note (Signed)
Transition of Care [facility]) - Progression Note    Patient Details  Name: Brittany Tran MRN: [id number] Date of Birth: 05/17/1961  [facility] CM/SW Contact  Reece Agar, [place] Phone Number: 02/11/2022, 4:42 PM  Clinical Narrative:    CSW spoke with pt about wanting to go to [facility] in [place], [place] to be closer to family. Pt states she lives along and has family in [place] but does not want to put a burden on friends.  CSW asked pt about getting to [place] if she is approved. Pt shared that she has a friend that has a medical transport [facility] that is willing to take her to [place].  CSW is waiting to hear back from [place] about insurance coverage.   CSW will continue to follow.   Expected Discharge Plan: [place] Barriers to Discharge: Continued Medical Work up  Expected Discharge Plan and Services Expected Discharge Plan: [place]   Discharge Planning Services: CM Consult Post Acute Care Choice: [place] arrangements for the past 2 months: Apartment                                       Social Determinants of Health (SDOH) Interventions    Readmission Risk Interventions     No data to display

## 2022-02-11 NOTE — Progress Notes (Signed)
Pt states she would like to go to [facility] [place] in [place].

## 2022-02-11 NOTE — Progress Notes (Signed)
Saline lock fell out, Dr. Orpah Cobb with leaving it out for now.

## 2022-02-11 NOTE — Progress Notes (Signed)
PROGRESS NOTE Brittany Tran  MOQ:[id number] DOB: 04-26-1961 DOA: 02/01/2022 PCP: Curt Jews, PA-C   Brief Narrative/Hospital Course:  61 years old female with past medical history of diabetes mellitus type 2, morbid obesity with BMI 43, chronic venous stasis ulceration worse on LLE, was found by neighbors slumped in her chair and minimally responsive after they did not hear from her in 12 hours. EMS was called and  transported to [facility] for evaluation with a concern for UTI and or leg infection and sepsis.In the ED, he was febrile with a temperature of 103.3 F and was tachycardic.  Patient was very confused and disoriented initially.  Patient was then admitted to the hospital for UTI and left lower extremity cellulitis.  Code sepsis was initiated initially.   During hospitalization, CT scan of the abdomen showed possible exophytic mass of the right kidney.  Patient continued to have leg pain and generalized body pain.  MRI of the abdomen showed large renal tumor.  Spoke with urology who recommends a follow-up with Dr. Gilford Rile and Dr. Tresa Moore as outpatient for further evaluation.  Patient also developed erythematous rash diffusely over her body after administration of vancomycin which was changed to cefazolin but he still does have more rash so at this time cefazolin has been discontinued and has been started on doxycycline.  Patient did have some exfoliation of the skin but no other issues and continued to feel better.  Doxycycline has been discontinued at this time.  Physical therapy has seen the patient and recommended skilled nursing facility at this time.  Assessment and Plan: Principal Problem:   Cellulitis of left leg Active Problems:   Sepsis due to cellulitis ([place])   Cellulitis of left lower extremity   Diabetes mellitus without complication (HCC)   Chronic diastolic CHF (congestive heart failure) (HCC)   Anticoagulation goal of INR 2 to 3   Renal lesion   Rheumatoid arthritis (HCC)    Hypertension   Skin yeast infection   Hypokalemia   Sepsis secondary to Left lower extremity cellulitis and UTI, POA: History of chronic venous stasis ulceration.  Patient presented with signs of sepsis.  Patient was initially on  vancomycin and Rocephin.   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  Patient was then transitioned to doxycycline.  On IV Solu-Medrol and antihistamines.   Blood cultures negative in >5 days.  Temperature max of 98.4.  WBC at 9.6 from 17.5 < 20.2 K improving.  Blood pressure has improved at this time.  Diffuse erythematous rash.  Mild scaling and exfoliation today, no mucosal involvement.  Patient continues to feels better.  Redness has improved but with scaling of skin. . Likely allergy to cephalosporins as well.  Communicated with the pharmacy about it.  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  We will discontinue doxycycline at this time and cellulitis has improved and completed course of antibiotic.. On IV Solu-Medrol.  Improving rash.  We will continue Solu-Medrol for today.  We will ask wound care to reassess the patient  Acute metabolic encephalopathy.  Present on admission.  Likely multifactorial from sepsis, UTI cellulitis.  Improved at this time.   Exophytic lesion on the anterior aspect of the mid upper right kidney CT abdomen patient with 4.8 x 3.4 x 4.1 centimeter exophytic lesion anterior aspect mid-upper right kidney.  Differential diagnosis was hemorrhagic cyst vs tumor.  Urinalysis showed RBC 11-20 per high-power field.  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 Size of 4.6 x 3.5 x 4.6 cm.  Spoke with urology Dr. Matilde Sprang on 02/06/2022 who recommends following up with Dr. Gilford Rile or Dr. Tresa Moore,  urology for further evaluation as outpatient.  We will need to set up an appointment prior to discharge.  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 pharmacy dosing, latest INR of 2.4  Chronic diastolic CHF- w/ mild acute component with orthopnea  Extensive lower leg edema: Has shortness of breath and dyspnea on exertion but is sedentary.  Last 2D echocardiogram f on 02/02/2022 showed LV ejection fraction of 60 to 65% with no regional wall motion abnormality, with grade 1 diastolic dysfunction.  CTA was limited but no evidence of pulmonary embolism but had some vascular congestion.    Patient is negative balance for 1821 mL..  Continue to hold Lasix  due to borderline low blood pressure..   Diabetes mellitus type II on long-term insulin with episodes of hypoglycemia and hyperglycemia Insulin dose has been reduced at this time.  Latest POC glucose of 137  Cutaneous yeast infection under panniculus: Continue nystatin powder  Hypertension: Had borderline hypotension which has improved .  Continue to hold antihypertensives, hold Lasix.  Rheumatoid arthritis: Continue prednisone after IV Solu-Medrol.  Continue to hold methotrexate due to infection, patient was not taking methotrexate since she had not had a follow-up done  Morbid obesity BMI 43-patient will benefit from ongoing weight loss as outpatient.  Fibromyalgia, generalized body pain, rheumatoid arthritis..  Continue tramadol and oxycodone for pain management.  Has history of rheumatoid arthritis and chronic pain syndrome.  On IV steroids at this time, patient reports with improvement of pain at this time..  Deconditioning, debility physical therapy has recommended skilled nursing facility placement.    Mild hypokalemia.  Resolved after replacement.  Mild AKI.  Resolved.    Constipation on MiraLAX and Colace.  Will add Dulcolax suppository since he has been few days now.  DVT prophylaxis: coumadin  Code Status:   Code  Status: Full Code  Family Communication:  I spoke with the patient's mother on the phone and updated her about the clinical condition of the patient on 02/08/2022.  Patient status is: Inpatient because of need for antibiotics, Debility, weakness, new renal mass, pending clinical improvement, need for rehabilitation,   Level of care: Telemetry Medical   Dispo: The patient is from: home   Anticipated disposition: PT OT recommends skilled nursing facility on discharge.  Patient wishes to be near [place] next to her family members.  Communicated this with the [facility] staff.   Subjective:  Today, patient was seen and examined at bedside.  Patient has not had a bowel movement in several days.  Denies any nausea vomiting fever chills or rigor.  Skin redness has improved but scaling present.  Denies oral pain ulcers.  Overall feels better.  Wishes to go to [place] for rehabilitation nearby family members.  Objective:  Vitals last 24 hrs: Vitals:   02/10/22 2108 02/10/22 2203 02/11/22 0526 02/11/22 0752  BP: 128/80 (!) 133/92 129/82 107/71  Pulse: 71 67 (!) 56 62  Resp: '18 17 16 18  '$ Temp:  97.6 F (36.4 C) 97.6 F (36.4 C) (!) 97.5 F (36.4 C)  TempSrc:  Oral Oral Oral  SpO2: 97% 98% 100% 100%  Weight:  Height:       Physical Examination:  General:  Average built, not in obvious distress HENT:   No scleral pallor or icterus noted. Oral mucosa is moist.  Chest:    Diminished breath sounds bilaterally. No crackles or wheezes.  CVS: S1 &S2 heard. No murmur.  Regular rate and rhythm. Abdomen: Soft, nontender, nondistended.  Bowel sounds are heard.   Extremities: No cyanosis, clubbing or edema.  Peripheral pulses are palpable.  Left leg ulcer with cellulitis. Psych: Alert, awake and oriented, normal mood CNS:  No cranial nerve deficits.  Power equal in all extremities.   Skin: Warm and dry.  Left leg ulcer with cellulitis much improved.  Chronic venous insufficiency stasis changes.   Erythematous rash over the body and face with some scaling.  Erythema has improved.   Medications reviewed:  Scheduled Meds:  bisacodyl  10 mg Rectal Daily   docusate sodium  100 mg Oral BID   feeding supplement  237 mL Oral BID BM   folic acid  2 mg Oral Daily   furosemide  20 mg Intravenous Daily   gabapentin  300 mg Oral TID   gabapentin  400 mg Oral QHS   insulin aspart  0-9 Units Subcutaneous TID WC   insulin glargine-yfgn  5 Units Subcutaneous QHS   multivitamin with minerals  1 tablet Oral Daily   nystatin   Topical BID   polyethylene glycol  17 g Oral Daily   predniSONE  5 mg Oral Q breakfast   topiramate  25 mg Oral BID   warfarin  2.5 mg Oral ONCE-1600   Warfarin - Pharmacist Dosing Inpatient   Does not apply q1600   Continuous Infusions:   Data Reviewed:  I have personally reviewed the following labs and imaging studies.    CBC: Recent Labs  Lab 02/07/22 0454 02/08/22 0550 02/09/22 0458 02/10/22 0229 02/11/22 0148  WBC 25.2* 30.5* 25.9* 17.5* 9.6  HGB 13.6 12.6 11.8* 12.0 11.7*  HCT 41.3 37.2 35.2* 36.2 35.0*  MCV 99.5 97.6 98.6 98.4 97.5  PLT 233 248 276 291 841    Basic Metabolic Panel: Recent Labs  Lab 02/05/22 0500 02/06/22 0533 02/07/22 0454 02/08/22 0550 02/09/22 0458 02/10/22 0229 02/11/22 0148  NA 140   < > 136 135 134* 133* 136  K 3.4*   < > 4.0 4.0 4.9 5.2* 3.9  CL 109   < > 104 104 102 102 103  CO2 23   < > 22 21* '23 24 24  '$ GLUCOSE 79   < > 68* 81 212* 211* 216*  BUN 10   < > '14 16 22 '$ 28* 28*  CREATININE 0.82   < > 0.94 0.95 0.77 0.72 0.66  CALCIUM 7.5*   < > 7.7* 7.9* 8.1* 7.9* 7.7*  MG 1.9  --   --  1.8 2.3 2.3  --    < > = values in this interval not displayed.    GFR: Estimated Creatinine Clearance: 97.7 mL/min (by C-G formula based on SCr of 0.66 mg/dL). Liver Function Tests: No results for input(s): "AST", "ALT", "ALKPHOS", "BILITOT", "PROT", "ALBUMIN" in the last 168 hours.  No results for input(s): "LIPASE", "AMYLASE"  in the last 168 hours. No results for input(s): "AMMONIA" in the last 168 hours. Coagulation Profile: Recent Labs  Lab 02/07/22 0454 02/08/22 0550 02/09/22 0458 02/10/22 0229 02/11/22 0148  INR 3.6* 3.1* 2.0* 2.1* 2.4*    BNP (last 3 results) No results for input(s): "PROBNP"  in the last 8760 hours. HbA1C: No results for input(s): "HGBA1C" in the last 72 hours.  CBG: Recent Labs  Lab 02/10/22 1146 02/10/22 1634 02/10/22 2106 02/10/22 2205 02/11/22 0747  GLUCAP 188* 215* 235* 225* 137*    Lipid Profile: No results for input(s): "CHOL", "HDL", "LDLCALC", "TRIG", "CHOLHDL", "LDLDIRECT" in the last 72 hours. Thyroid Function Tests: No results for input(s): "TSH", "T4TOTAL", "FREET4", "T3FREE", "THYROIDAB" in the last 72 hours. Sepsis Labs: No results for input(s): "PROCALCITON", "LATICACIDVEN" in the last 168 hours.   Recent Results (from the past 240 hour(s))  Culture, blood (Routine x 2)     Status: None   Collection Time: 02/01/22  4:03 PM   Specimen: BLOOD  Result Value Ref Range Status   Specimen Description BLOOD LEFT ANTECUBITAL  Final   Special Requests   Final    BOTTLES DRAWN AEROBIC AND ANAEROBIC Blood Culture adequate volume   Culture   Final    NO GROWTH 5 DAYS Performed at [facility], [address], [place], [place] [address]    Report Status 02/06/2022 FINAL  Final  Culture, blood (Routine x 2)     Status: None   Collection Time: 02/01/22  4:11 PM   Specimen: BLOOD  Result Value Ref Range Status   Specimen Description BLOOD RIGHT ANTECUBITAL  Final   Special Requests   Final    BOTTLES DRAWN AEROBIC AND ANAEROBIC Blood Culture results may not be optimal due to an excessive volume of blood received in culture bottles   Culture   Final    NO GROWTH 5 DAYS Performed at [facility], [place] [address], [place], [place] [address]    Report Status 02/06/2022 FINAL  Final  Urine Culture     Status: Abnormal   Collection Time: 02/01/22   4:21 PM   Specimen: In/Out Cath Urine  Result Value Ref Range Status   Specimen Description IN/OUT CATH URINE  Final   Special Requests   Final    NONE Performed at [facility], [address] [place], [place] [address]    Culture 100 COLONIES/mL ESCHERICHIA COLI (A)  Final   Report Status 02/04/2022 FINAL  Final   Organism ID, Bacteria ESCHERICHIA COLI (A)  Final      Susceptibility   Escherichia coli - MIC*    AMPICILLIN 4 SENSITIVE Sensitive     CEFAZOLIN <=4 SENSITIVE Sensitive     CEFEPIME <=0.12 SENSITIVE Sensitive     CEFTRIAXONE <=0.25 SENSITIVE Sensitive     CIPROFLOXACIN <=0.25 SENSITIVE Sensitive     GENTAMICIN <=1 SENSITIVE Sensitive     IMIPENEM <=0.25 SENSITIVE Sensitive     NITROFURANTOIN 32 SENSITIVE Sensitive     TRIMETH/SULFA <=20 SENSITIVE Sensitive     AMPICILLIN/SULBACTAM <=2 SENSITIVE Sensitive     PIP/TAZO <=4 SENSITIVE Sensitive     * 100 COLONIES/mL ESCHERICHIA COLI    Antimicrobials: Anti-infectives (From admission, onward)    Start     Dose/Rate Route Frequency Ordered Stop   02/08/22 1000  doxycycline (VIBRA-TABS) tablet 100 mg  Status:  Discontinued        100 mg Oral Every 12 hours 02/08/22 0802 02/11/22 0738   02/03/22 1700  cefTRIAXone (ROCEPHIN) 2 g in sodium chloride 0.9 % 100 mL IVPB  Status:  Discontinued        2 g 200 mL/hr over 30 Minutes Intravenous Every 24 hours 02/03/22 1018 02/07/22 0816   02/02/22 1700  cefTRIAXone (ROCEPHIN)  1 g in sodium chloride 0.9 % 100 mL IVPB  Status:  Discontinued        1 g 200 mL/hr over 30 Minutes Intravenous Every 24 hours 02/02/22 0154 02/03/22 1018   02/02/22 1100  vancomycin (VANCOREADY) IVPB 750 mg/150 mL  Status:  Discontinued        750 mg 75 mL/hr over 120 Minutes Intravenous Every 12 hours 02/01/22 2229 02/06/22 1715   02/01/22 2145  vancomycin (VANCOREADY) IVPB 1750 mg/350 mL        1,750 mg 116.7 mL/hr over 180 Minutes Intravenous  Once 02/01/22 2143 02/02/22 0054   02/01/22 1645   cefTRIAXone (ROCEPHIN) 2 g in sodium chloride 0.9 % 100 mL IVPB        2 g 200 mL/hr over 30 Minutes Intravenous  Once 02/01/22 1633 02/01/22 1843      Radiology Studies: No results found.   LOS: 9 days   Flora Lipps, MD [facility] 02/11/2022, 9:39 AM

## 2022-02-11 NOTE — Progress Notes (Signed)
[facility] for Warfarin Indication:  history of recurrent DVTs  Allergies  Allergen Reactions   Penicillins Hives    Has patient had a PCN reaction causing immediate rash, facial/tongue/throat swelling, SOB or lightheadedness with hypotension: Yes Has patient had a PCN reaction causing severe rash involving mucus membranes or skin necrosis: No Has patient had a PCN reaction that required hospitalization: No Has patient had a PCN reaction occurring within the last 10 years: No If all of the above answers are "NO", then may proceed with Cephalosporin use.    Vancomycin Rash    Developed a red rash bilaterally on her arms, legs, neck, chest and abdomen   Cefazolin Rash   Penicillin G Benzathine Rash   Sulfa Antibiotics Hives and Rash    Other reaction(s): Other (See Comments) Other reaction(s): Unknown    Patient Measurements: Height: '5\' 6"'$  (167.6 cm) Weight: 123.4 kg (272 lb 0.8 oz) IBW/kg (Calculated) : 59.3  Vital Signs: Temp: 97.5 F (36.4 C) (08/28 0752) Temp Source: Oral (08/28 0752) BP: 107/71 (08/28 0752) Pulse Rate: 62 (08/28 0752)  Labs: Recent Labs    02/09/22 0458 02/10/22 0229 02/11/22 0148  HGB 11.8* 12.0 11.7*  HCT 35.2* 36.2 35.0*  PLT 276 291 279  LABPROT 22.8* 23.3* 25.7*  INR 2.0* 2.1* 2.4*  CREATININE 0.77 0.72 0.66     Estimated Creatinine Clearance: 97.7 mL/min (by C-G formula based on SCr of 0.66 mg/dL).  Assessment: 61 yo F with medical history significant for history of recurrent DVTs on warfarin PTA who presented minimally responsive suspected to have UTI or cellulitis infection. Last warfarin dose PTA 8/17. Pharmacy consulted to manage warfarin.  PTA warfarin regimen per [facility] visit 01/09/22: - warfarin 5 mg PO Mon, Weds, Fri, Sat - warfarin 2.5 mg PO Tue, Thurs, Sun  INR supratherapeutic x3 days, warfarin dose held 8/23-8/24. Warfarin '1mg'$  given 8/25. INR is therapeutic today at 2.4. CBC stable, no  issues with bleeding reported. Doxycycline stopped 8/27.  Goal of Therapy:  INR 2-3 Monitor platelets by anticoagulation protocol: Yes   Plan:  Give warfarin 2.5 mg PO x1 dose Check INR daily while on warfarin Continue to monitor H&H and platelets  Thank you for involving pharmacy in this patient's care.  Renold Genta, PharmD, BCPS Clinical Pharmacist Clinical phone for 02/11/2022 until 3p is [phone] 02/11/2022 9:05 AM

## 2022-02-11 NOTE — Progress Notes (Signed)
Mobility Specialist - Progress Note   02/11/22 1616  Mobility  Activity Ambulated with assistance in room  Level of Assistance Moderate assist, patient does 50-74%  Assistive Device Front wheel walker  Distance Ambulated (ft) 10 ft  Activity Response Tolerated well  $Mobility charge 1 Mobility    Pt received in recliner and agreeable to mobility. Left EOB w/ call bell and all needs met.   Paulla Dolly Mobility Specialist

## 2022-02-11 NOTE — Progress Notes (Signed)
Occupational Therapy Treatment Patient Details Name: Brittany Tran MRN: [id number] DOB: 1960-09-21 Today's Date: 02/11/2022   History of present illness Patient is a 61 y/o female who presents on 8/18 for generalized weakness and found slumped in her chair minimally responsive by her neighbors. Found to have UTI and sepsis secondary to LLE cellulitis. 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 PMH includes DM, HF, HTN, DVT, fibromyalgia, RA.   OT comments  Pt with gradual progression towards OT goals, remains limited by LLE discomfort and activity tolerance deficits. Overall, pt able to return demo serial sit to stand transfers using RW with Min A. Pt able to complete UB ADLs with Setup assist in recliner but requires increased assist for LB ADL d/t deficits. Continue to rec SNF rehab at DC with pt reporting desire for [place] SN[facility] to be closer to family.    Recommendations for follow up therapy are one component of a multi-disciplinary discharge planning process, led by the attending physician.  Recommendations may be updated based on patient status, additional functional criteria and insurance authorization.    Follow Up Recommendations  Skilled nursing-short term rehab (<3 hours/day)    Assistance Recommended at Discharge Frequent or constant Supervision/Assistance  Patient can return home with the following  A little help with walking and/or transfers;A lot of help with bathing/dressing/bathroom;Assistance with cooking/housework   Equipment Recommendations  Tub/shower bench    Recommendations for Other Services      Precautions / Restrictions Precautions Precautions: Fall Restrictions Weight Bearing Restrictions: No       Mobility Bed Mobility               General bed mobility comments: up in chair on entry    Transfers Overall transfer level: Needs assistance Equipment used: Rolling walker (2 wheels) Transfers: Sit to/from Stand Sit to Stand: Min  assist           General transfer comment: STS practice with RW. pt able to recall hand placement and use of momentum to stand with Min A to fully tuck bottom in     Balance Overall balance assessment: Needs assistance Sitting-balance support: No upper extremity supported, Feet supported Sitting balance-Leahy Scale: Fair     Standing balance support: Bilateral upper extremity supported, Reliant on assistive device for balance Standing balance-Leahy Scale: Poor                             ADL either performed or assessed with clinical judgement   ADL Overall ADL's : Needs assistance/impaired     Grooming: Set up;Oral care;Wash/dry face;Sitting Grooming Details (indicate cue type and reason): up in recliner, reaching forward to tray table with ease             Lower Body Dressing: Moderate assistance;Sit to/from stand Lower Body Dressing Details (indicate cue type and reason): assist for shoe mgmt               General ADL Comments: Educated in progression/trial of bathroom mobility in next session based on standing abilities today. educated/encouraged BSC use rather than purewick/bedpan with whiteboard updated in room accordingly    Extremity/Trunk Assessment Upper Extremity Assessment Upper Extremity Assessment: Overall WFL for tasks assessed   Lower Extremity Assessment Lower Extremity Assessment: Defer to PT evaluation        Vision   Vision Assessment?: No apparent visual deficits   Perception     Praxis  Cognition Arousal/Alertness: Awake/alert Behavior During Therapy: WFL for tasks assessed/performed Overall Cognitive Status: Within Functional Limits for tasks assessed                                          Exercises Exercises: Other exercises Other Exercises Other Exercises: sit to stand practice x 3 with RW    Shoulder Instructions       General Comments Peeling skin all over pt body - uses  personalized lotion/body wash to avoid irritation. Pt's friend entering to drop off suitcase with clothing for rehab during session    Pertinent Vitals/ Pain       Pain Assessment Pain Assessment: Faces Faces Pain Scale: Hurts a little bit Pain Location: L LE with standing Pain Descriptors / Indicators: Sore Pain Intervention(s): Monitored during session, Limited activity within patient's tolerance  Home Living                                          Prior Functioning/Environment              Frequency  Min 2X/week        Progress Toward Goals  OT Goals(current goals can now be found in the care plan section)  Progress towards OT goals: Progressing toward goals  Acute Rehab OT Goals OT Goal Formulation: With patient Time For Goal Achievement: 02/18/22 Potential to Achieve Goals: Good ADL Goals Pt Will Perform Grooming: with modified independence;standing Pt Will Perform Lower Body Bathing: with modified independence;with adaptive equipment;sit to/from stand Pt Will Perform Lower Body Dressing: with modified independence;sit to/from stand;with adaptive equipment Pt Will Transfer to Toilet: with modified independence;ambulating;bedside commode Pt Will Perform Toileting - Clothing Manipulation and hygiene: with modified independence;with adaptive equipment;sit to/from stand Additional ADL Goal #1: Pt will employ energy conservation strategies in ADLs and mobility as instructed.  Plan Discharge plan remains appropriate    Co-evaluation                 AM-PAC OT "6 Clicks" Daily Activity     Outcome Measure   Help from another person eating meals?: None Help from another person taking care of personal grooming?: A Little Help from another person toileting, which includes using toliet, bedpan, or urinal?: A Lot Help from another person bathing (including washing, rinsing, drying)?: A Lot Help from another person to put on and taking off  regular upper body clothing?: A Little Help from another person to put on and taking off regular lower body clothing?: A Lot 6 Click Score: 16    End of Session Equipment Utilized During Treatment: Rolling walker (2 wheels)  OT Visit Diagnosis: Unsteadiness on feet (R26.81);Other abnormalities of gait and mobility (R26.89);Pain;Muscle weakness (generalized) (M62.81)   Activity Tolerance Patient tolerated treatment well   Patient Left in chair;with call bell/phone within reach;with chair alarm set   Nurse Communication          Time: 3893-7342 OT Time Calculation (min): 37 min  Charges: OT General Charges $OT Visit: 1 Visit OT Treatments $Self Care/Home Management : 8-22 mins $Therapeutic Activity: 8-22 mins  Malachy Chamber, OTR/L Acute Rehab Services Office: [phone]   Layla Maw 02/11/2022, 1:55 PM

## 2022-02-11 NOTE — Progress Notes (Signed)
Mobility Specialist - Progress Note   02/11/22 1317  Mobility  Activity Ambulated with assistance in room  Level of Assistance Moderate assist, patient does 50-74%  Assistive Device Front wheel walker  Distance Ambulated (ft) 10 ft  Activity Response Tolerated well  $Mobility charge 1 Mobility    Pt received in bed and agreeable to mobility. No dizziness throughout. Left in recliner w/ call bell and all needs met.   Paulla Dolly Mobility Specialist

## 2022-02-12 DIAGNOSIS — Z5181 Encounter for therapeutic drug level monitoring: Secondary | ICD-10-CM | POA: Diagnosis not present

## 2022-02-12 DIAGNOSIS — A419 Sepsis, unspecified organism: Secondary | ICD-10-CM | POA: Diagnosis not present

## 2022-02-12 DIAGNOSIS — I5032 Chronic diastolic (congestive) heart failure: Secondary | ICD-10-CM | POA: Diagnosis not present

## 2022-02-12 DIAGNOSIS — L03116 Cellulitis of left lower limb: Secondary | ICD-10-CM | POA: Diagnosis not present

## 2022-02-12 LAB — BASIC METABOLIC PANEL
Anion gap: 8 (ref 5–15)
BUN: 25 mg/dL — ABNORMAL HIGH (ref 8–23)
CO2: 26 mmol/L (ref 22–32)
Calcium: 8 mg/dL — ABNORMAL LOW (ref 8.9–10.3)
Chloride: 104 mmol/L (ref 98–111)
Creatinine, Ser: 0.61 mg/dL (ref 0.44–1.00)
GFR, Estimated: >60 mL/min (ref >60)
Glucose, Bld: 210 mg/dL — ABNORMAL HIGH (ref 70–99)
Potassium: 4.1 mmol/L (ref 3.5–5.1)
Sodium: 138 mmol/L (ref 135–145)

## 2022-02-12 LAB — GLUCOSE, CAPILLARY
Glucose-Capillary: 82 mg/dL (ref 70–99)
Glucose-Capillary: 110 mg/dL — ABNORMAL HIGH (ref 70–99)
Glucose-Capillary: 185 mg/dL — ABNORMAL HIGH (ref 70–99)
Glucose-Capillary: 133 mg/dL — ABNORMAL HIGH (ref 70–99)

## 2022-02-12 LAB — CBC
HCT: 37.7 % (ref 36.0–46.0)
Hemoglobin: 12.9 g/dL (ref 12.0–15.0)
MCH: 33.3 pg (ref 26.0–34.0)
MCHC: 34.2 g/dL (ref 30.0–36.0)
MCV: 97.4 fL (ref 80.0–100.0)
Platelets: 246 10*3/uL (ref 150–400)
RBC: 3.87 MIL/uL (ref 3.87–5.11)
RDW: 15.5 % (ref 11.5–15.5)
WBC: 8.9 10*3/uL (ref 4.0–10.5)
nRBC: 0 % (ref 0.0–0.2)

## 2022-02-12 LAB — MAGNESIUM: Magnesium: 2.2 mg/dL (ref 1.7–2.4)

## 2022-02-12 LAB — PROTIME-INR
INR: 2.9 — ABNORMAL HIGH (ref 0.8–1.2)
Prothrombin Time: 29.8 seconds — ABNORMAL HIGH (ref 11.4–15.2)

## 2022-02-12 MED ORDER — FUROSEMIDE 20 MG PO TABS: 20.0000 mg | ORAL_TABLET | Freq: Every day | ORAL | Status: AC

## 2022-02-12 MED ORDER — OXYCODONE-ACETAMINOPHEN 5-325 MG PO TABS: 1.0000 | ORAL_TABLET | Freq: Four times a day (QID) | ORAL | 0 refills | Status: AC | PRN

## 2022-02-12 MED ORDER — ENSURE ENLIVE PO LIQD: 237.0000 mL | Freq: Two times a day (BID) | ORAL | Status: DC

## 2022-02-12 MED ORDER — HYDROCERIN EX CREA: 1.0000 | TOPICAL_CREAM | Freq: Two times a day (BID) | CUTANEOUS | 0 refills | Status: AC

## 2022-02-12 MED ORDER — PREDNISONE 1 MG PO TABS: ORAL_TABLET | ORAL | 0 refills | Status: DC

## 2022-02-12 MED ORDER — GABAPENTIN 300 MG PO CAPS: 300.0000 mg | ORAL_CAPSULE | Freq: Three times a day (TID) | ORAL | Status: DC

## 2022-02-12 MED ORDER — HYDROCERIN EX CREA
TOPICAL_CREAM | Freq: Two times a day (BID) | CUTANEOUS | Status: DC
  Filled 2022-02-12: qty 113

## 2022-02-12 MED ORDER — NYSTATIN 100000 UNIT/GM EX POWD: Freq: Two times a day (BID) | CUTANEOUS | 0 refills | Status: DC

## 2022-02-12 MED ORDER — ADULT MULTIVITAMIN W/MINERALS CH: 1.0000 | ORAL_TABLET | Freq: Every day | ORAL | Status: AC

## 2022-02-12 MED ORDER — POLYETHYLENE GLYCOL 3350 17 G PO PACK: 17.0000 g | PACK | Freq: Every day | ORAL | 0 refills | Status: AC | PRN

## 2022-02-12 MED ORDER — FOLIC ACID 1 MG PO TABS: 2.0000 mg | ORAL_TABLET | Freq: Every day | ORAL | Status: DC

## 2022-02-12 MED ORDER — WARFARIN SODIUM 2.5 MG PO TABS
2.5000 mg | ORAL_TABLET | Freq: Once | ORAL | Status: AC
Start: 2022-02-12 — End: 2022-02-12
  Administered 2022-02-12: 2.5 mg via ORAL
  Filled 2022-02-12: qty 1

## 2022-02-12 MED ORDER — DOCUSATE SODIUM 100 MG PO CAPS: 100.0000 mg | ORAL_CAPSULE | Freq: Two times a day (BID) | ORAL | 0 refills | Status: DC

## 2022-02-12 NOTE — Discharge Summary (Signed)
Physician Discharge Summary   Patient: Brittany Tran MRN: [id number] DOB: 25-Aug-1960  Admit date:     02/01/2022  Discharge date: 02/13/22  Discharge Physician: Corrie Mckusick Liliane Mallis   PCP: Curt Jews, PA-C   Recommendations at discharge:   Follow-up with your primary care provider at the skilled nursing facility in 3 to 5 days.   Check CBC BMP magnesium phosphorus and LFT in the next visit. Will need to follow-up with with urology  in [place] or at [facility] to discuss about the kidney tumor noted on the CT scan and MRI of the abdomen.  Might need removal for possible renal cancer. Patient developed a diffuse redness and scaling of the skin without any mucosal involvement which has been improving at this time and required steroids..  She is allergic to cephalosporin penicillin and vancomycin.  Discharge Diagnoses: Active Problems:   Diabetes mellitus without complication (HCC)   Chronic diastolic CHF (congestive heart failure) (HCC)   Anticoagulation goal of INR 2 to 3   Renal lesion   Rheumatoid arthritis (HCC)   Hypertension   Skin yeast infection  Principal Problem (Resolved):   Cellulitis of left leg Resolved Problems:   Sepsis due to cellulitis (HCC)   Cellulitis of left lower extremity   Hypokalemia  Hospital Course: 61 years old female with past medical history of diabetes mellitus type 2, morbid obesity with BMI 43, chronic venous stasis ulceration worse on LLE, was found by neighbors slumped in her chair and minimally responsive after they did not hear from her in 12 hours. EMS was called and  transported to [facility] for evaluation with a concern for UTI and or leg infection and sepsis.In the ED, he was febrile with a temperature of 103.3 F and was tachycardic.  Patient was very confused and disoriented initially.  Patient was then admitted to the hospital for UTI and left lower extremity cellulitis.  Code sepsis was initiated initially.    During  hospitalization, CT scan of the abdomen showed possible exophytic mass of the right kidney.  Patient continued to have leg pain and generalized body pain.  MRI of the abdomen showed large renal tumor.  Spoke with urology who recommends a follow-up with Dr. Gilford Rile and Dr. Tresa Moore as outpatient for further evaluation.  Patient also developed erythematous rash diffusely over her body after administration of vancomycin which was changed to cefazolin but still had more rash so at this time cefazolin has been discontinued, allergy documented  and has been started on doxycycline.  Patient did have some exfoliation of the skin but no other issues and continues to feel better.  Doxycycline has been discontinued at this time after completion of antibiotic.[facility] Physical therapy has seen the patient and recommended skilled nursing facility at this time.  Following conditions were addressed during hospitalization,  Sepsis secondary to left lower extremity cellulitis and UTI, POA: History of chronic venous stasis ulceration.  Patient presented with signs of sepsis.  Patient was initially on  vancomycin and Rocephin.   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  Patient was then transitioned to doxycycline.  Has completed course of doxycycline so this has been discontinued.  Patient received IV Solu-Medrol and antihistamines for the skin rash and has improved at this time..   Blood cultures negative in >5 days.  Temperature max of 31F.  WBC at 8.9 from 9.6 <17.5 < 20.2 K improving.  Blood pressure has improved  at this time.  Patient will continue prednisone from home on discharge with slow  taper over the next few days.   Diffuse erythematous rash.  Mild scaling and exfoliation with erythema but  no mucosal involvement.  Patient however continued to improve.  Of note patient is allergic to multiple antibiotic including vancomycin penicillin and cephalosporin.  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  Received IV Solu-Medrol and antihistamines in hospitalization.  We will taper prednisone for next few days on discharge.     Acute metabolic encephalopathy.  Likely multifactorial from sepsis, UTI cellulitis.  Resolved at this time.   Exophytic lesion on the anterior aspect of the mid upper right kidney CT abdomen patient with 4.8 x 3.4 x 4.1 centimeter exophytic lesion anterior aspect mid-upper right kidney.  Differential diagnosis was hemorrhagic cyst vs tumor.  Urinalysis showed RBC 11-20 per high-power field. 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 Size of 4.6 x 3.5 x 4.6 cm.  Spoke with urology Dr. Matilde Sprang, urology on 02/06/2022 who recommends following up with Dr. Gilford Rile or Dr. Tresa Moore, urology for further evaluation as outpatient.  At this time patient wishes to go to [facility] area.  She will need to follow-up with urology there versus come to [place].    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  We will continue Coumadin on discharge.  Chronic diastolic CHF- w/ mild acute component with orthopnea  Extensive lower leg edema: Improved.  Patient has shortness of breath and dyspnea on exertion but is sedentary.  Last 2D echocardiogram on 02/02/2022 showed LV ejection fraction of 60 to 65% with no regional wall motion abnormality, with grade 1 diastolic dysfunction.  CTA was limited but no evidence of pulmonary embolism but had some vascular congestion.    Resume Lasix after discharge.   Diabetes mellitus type II on long-term insulin with episodes of hypoglycemia and hyperglycemia Received in saline while on steroids.  Has been rapidly tapered down.  Was on p.o. prednisone 1 mg at home.  Not on oral medications for diabetes at home.   Continue diet control on discharge.  Hemoglobin A1c of 6.4 on 02/02/2022.   Cutaneous yeast infection under panniculus: Continue nystatin powder on discharge.   Hypertension: Had borderline hypotension which has improved .  Will resume Lasix after discharge.  Rheumatoid arthritis:  Continue prednisone discharge.  Patient was not taking methotrexate since she had not had a follow-up done advised to follow-up with rheumatology as outpatient.   Morbid obesity BMI 43- Patient will benefit from ongoing weight loss as outpatient.   Fibromyalgia, generalized body pain, rheumatoid arthritis.   We will prescribe oxycodone temporarily for pain management.  Has history of rheumatoid arthritis and chronic pain syndrome.  We will continue prednisone on discharge.   Mild hypokalemia.  Resolved after replacement.   Mild AKI.  Latest creatinine of 0.6.  Resolved.     Constipation continue MiraLAX and Colace.  As needed Dulcolax suppository as needed.  Deconditioning, debility physical therapy has recommended skilled nursing facility placement.     Consultants: None Procedures performed: None Disposition: Skilled nursing facility Diet recommendation:  Discharge Diet Orders (From admission, onward)     Start     Ordered   02/12/22 0000  Diet general        02/12/22 1347  Carb modified diet DISCHARGE MEDICATION: Allergies as of 02/12/2022       Reactions   Penicillins Hives   Has patient had a PCN reaction causing immediate rash, facial/tongue/throat swelling, SOB or lightheadedness with hypotension: Yes Has patient had a PCN reaction causing severe rash involving mucus membranes or skin necrosis: No Has patient had a PCN reaction that required hospitalization: No Has patient had a PCN reaction occurring within the last 10 years: No If all of the above answers are "NO", then may proceed with Cephalosporin use.   Vancomycin Rash   Developed a red rash bilaterally on her arms,  legs, neck, chest and abdomen   Cefazolin Rash   Penicillin G Benzathine Rash   Sulfa Antibiotics Hives, Rash   Other reaction(s): Other (See Comments) Other reaction(s): Unknown        Medication List     TAKE these medications    acetaminophen 325 MG tablet Commonly known as: TYLENOL Take 2 tablets (650 mg total) by mouth every 6 (six) hours as needed for mild pain or fever.   docusate sodium 100 MG capsule Commonly known as: COLACE Take 1 capsule (100 mg total) by mouth 2 (two) times daily. Hold for loose bowel movements   feeding supplement Liqd Take 237 mLs by mouth 2 (two) times daily between meals.   folic acid 1 MG tablet Commonly known as: FOLVITE Take 2 tablets (2 mg total) by mouth daily.   furosemide 20 MG tablet Commonly known as: Lasix Take 1 tablet (20 mg total) by mouth daily. Start taking on: February 14, 2022   gabapentin 300 MG capsule Commonly known as: NEURONTIN Take 1 capsule (300 mg total) by mouth 3 (three) times daily. What changed:  how much to take when to take this   glucose blood test strip Commonly known as: True Metrix Blood Glucose Test Use as instructed   hydrocerin Crea Apply 1 Application topically 2 (two) times daily. To dry skin and areas of scaling   methotrexate 2.5 MG tablet Commonly known as: RHEUMATREX Take 25 mg by mouth once a week.   multivitamin with minerals Tabs tablet Take 1 tablet by mouth daily. Start taking on: February 13, 2022   nystatin powder Commonly known as: MYCOSTATIN/NYSTOP Apply topically 2 (two) times daily. Apply to intertriginous area under panniculus   oxyCODONE-acetaminophen 5-325 MG tablet Commonly known as: PERCOCET/ROXICET Take 1 tablet by mouth every 6 (six) hours as needed for up to 5 days for moderate pain or severe pain.   polyethylene glycol 17 g packet Commonly known as: MIRALAX / GLYCOLAX Take 17 g by mouth daily as needed for moderate constipation or mild constipation.    predniSONE 1 MG tablet Commonly known as: DELTASONE Take 10 tablets (10 mg total) by mouth daily with breakfast for 3 days, THEN 5 tablets (5 mg total) daily with breakfast for 3 days, THEN 1 tablet (1 mg total) daily with breakfast. Start taking on: February 12, 2022 What changed: See the new instructions.   topiramate 25 MG tablet Commonly known as: Topamax Take 1 tablet (25 mg total) by mouth 2 (two) times daily.   TRUEplus Lancets 28G Misc Use to check blood sugar daily.   Vitamin D (Ergocalciferol) 1.25 MG (50000 UNIT) Caps capsule Commonly known as: DRISDOL Take 1 capsule (50,000 Units total) by mouth every 7 (seven) days.   warfarin 5 MG tablet Commonly known as: COUMADIN Take 2.5-5 mg by mouth See admin instructions. Take 2.5 mg on Sunday,  Tuesday, and Thursday. Then take 5 mg on Monday, Wednesday, Friday, and Saturday. Or as directed by Coumadin clinic.               Discharge Care Instructions  (From admission, onward)           Start     Ordered   02/12/22 0000  Discharge wound care:       Comments: Dry skin care, lower extremity ulcer care   02/12/22 1347            Follow-up Information     Alexis Frock, MD. Schedule an appointment as soon as possible for a visit in 1 week(s).   Specialty: Urology Why: for newly diagnosed renal cell neoplasm likely cancer Contact information: [place] [address] [phone]         Diamantina [facility] H, PA-C Follow up in 1 week(s).   Specialty: Physician Assistant Contact information: [address] [place] [address] [phone]                Discharge Exam: Filed Weights   02/01/22 1633  Weight: 123.4 kg      02/12/2022    7:43 AM 02/12/2022    4:54 AM 02/11/2022    7:47 PM  Vitals with BMI  Systolic [phone]  Diastolic 59 67 73  Pulse 66 64 61    General: Obese built, not in obvious distress HENT:   No scleral pallor or icterus noted. Oral mucosa  is moist.  Chest:  Diminished breath sounds bilaterally. No crackles or wheezes.  CVS: S1 &S2 heard. No murmur.  Regular rate and rhythm. Abdomen: Soft, nontender, nondistended.  Bowel sounds are heard.   Extremities: No cyanosis, clubbing or edema.  Peripheral pulses are palpable.  Left leg ulcer with cellulitis Psych: Alert, awake and oriented, normal mood CNS:  No cranial nerve deficits.  Power equal in all extremities.   Skin: Warm and dry.  No rashes noted.  Chronic venous insufficiency stasis changes.  Erythematous rash over the body and face with diffuse scaling.  Erythema has improved  Condition at discharge: fair  The results of significant diagnostics from this hospitalization (including imaging, microbiology, ancillary and laboratory) are listed below for reference.   Imaging Studies: MR ABDOMEN W WO CONTRAST  Result Date: 02/05/2022 CLINICAL DATA:  Renal lesions discovered on recent abdominal and pelvis CT. EXAM: MRI ABDOMEN WITHOUT AND WITH CONTRAST TECHNIQUE: Multiplanar multisequence MR imaging of the abdomen was performed both before and after the administration of intravenous contrast. CONTRAST:  24m GADAVIST GADOBUTROL 1 MMOL/ML IV SOLN COMPARISON:  February 01, 2022 FINDINGS: Lower chest: Incidental imaging of the lung bases shows moderate cardiomegaly and signs of basilar atelectasis. Lung base assessment is limited on MRI. Basilar volume loss in the RIGHT chest. No effusion. Hepatobiliary: Smooth hepatic contours. No focal, suspicious hepatic lesion. Mild hepatic steatosis. Gallbladder is collapsed about numerous intraluminal gallstones most greater than a cm largest 14 mm. No pericholecystic stranding. No biliary duct dilation. Portal vein is patent. Pancreas: Pancreatic atrophy without signs of inflammation or ductal dilation. No visible lesion. Spleen:  Normal. Adrenals/Urinary Tract:  Adrenal glands are normal. RIGHT kidney: Arising from the anterior upper pole of the RIGHT  kidney is an exophytic RIGHT renal mass measuring 4.6 x 3.5 x 4.6 cm showing mixed signal on T2, solid characteristics with enhancement. This extends from the superior hilar line along the anterior upper pole. Large RIGHT renal  cyst along the posterior margin of the RIGHT kidney measures 12 x 9 cm greatest axial dimension. LEFT kidney: Hyperintense lesion arises from posterior interpolar LEFT kidney measuring approximately 2 cm and showing low signal on T2 weighted imaging and no sign of enhancement. No additional suspicious renal lesions. No hydronephrosis. No perinephric stranding. Stomach/Bowel: Gastrointestinal tract is unremarkable to the extent evaluated on this abdominal MRI. Vascular/Lymphatic: No retroperitoneal adenopathy. No signs of vascular invasion. Other:  No ascites Musculoskeletal: No suspicious bone lesions identified. IMPRESSION: 1. 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 2. Small LEFT renal lesion is compatible with a hemorrhagic cyst. 3. Exam quality mildly compromised by respiratory motion particularly with respect to hepatic assessment. 4. No evidence of metastatic disease. 5. Large RIGHT renal cyst measuring 12 x 9 cm. 6. Mild hepatic steatosis. 7. Extensive cholelithiasis. No signs of inflammation adjacent to the gallbladder. Electronically Signed   By: Zetta Bills M.D.   On: 02/05/2022 11:32   ECHOCARDIOGRAM COMPLETE  Result Date: 02/02/2022    ECHOCARDIOGRAM REPORT   Patient Name:   Brittany Tran [facility] Date of Exam: 02/02/2022 Medical Rec #:  [id number]          Height:       66.0 in Accession #:    [id number]         Weight:       272.0 lb Date of Birth:  August 16, 1960          BSA:          2.279 m Patient Age:    59 years           BP:           133/73 mmHg Patient Gender: F                  HR:           78 bpm. Exam Location:  Inpatient Procedure: 2D Echo,  Cardiac Doppler, Color Doppler and Intracardiac            Opacification Agent Indications:    CHF - acute diastolic  History:        Patient has prior history of Echocardiogram examinations, most                 recent 03/13/2017. Risk Factors:Hypertension and Diabetes.  Sonographer:    Eartha Inch Referring Phys: [id number] MICHAEL E NORINS  Sonographer Comments: Suboptimal parasternal window, suboptimal apical window, suboptimal subcostal window and patient is obese. Image acquisition challenging due to patient body habitus. IMPRESSIONS  1. Left ventricular ejection fraction, by estimation, is 60 to 65%. The left ventricle has normal function. The left ventricle has no regional wall motion abnormalities. Left ventricular diastolic parameters are consistent with Grade I diastolic dysfunction (impaired relaxation).  2. Right ventricular systolic function is normal. The right ventricular size is normal.  3. The mitral valve is normal in structure. No evidence of mitral valve regurgitation. No evidence of mitral stenosis.  4. The aortic valve has an indeterminant number of cusps. Aortic valve regurgitation is not visualized. No aortic stenosis is present.  5. The inferior vena cava is normal in size with greater than 50% respiratory variability, suggesting right atrial pressure of 3 mmHg. FINDINGS  Left Ventricle: Left ventricular ejection fraction, by estimation, is 60 to 65%. The left ventricle has normal function. The left ventricle has no regional wall  motion abnormalities. Definity contrast agent was given IV to delineate the left ventricular  endocardial borders. The left ventricular internal cavity size was normal in size. There is no left ventricular hypertrophy. Left ventricular diastolic parameters are consistent with Grade I diastolic dysfunction (impaired relaxation). Normal left ventricular filling pressure. Right Ventricle: The right ventricular size is normal. Right vetricular wall thickness was not well  visualized. Right ventricular systolic function is normal. Left Atrium: Left atrial size was normal in size. Right Atrium: Right atrial size was normal in size. Pericardium: There is no evidence of pericardial effusion. Mitral Valve: The mitral valve is normal in structure. No evidence of mitral valve regurgitation. No evidence of mitral valve stenosis. Tricuspid Valve: The tricuspid valve is normal in structure. Tricuspid valve regurgitation is not demonstrated. No evidence of tricuspid stenosis. Aortic Valve: The aortic valve has an indeterminant number of cusps. Aortic valve regurgitation is not visualized. No aortic stenosis is present. Aortic valve mean gradient measures 6.9 mmHg. Aortic valve peak gradient measures 16.8 mmHg. Aortic valve area, by VTI measures 1.93 cm. Pulmonic Valve: The pulmonic valve was not well visualized. Pulmonic valve regurgitation is not visualized. No evidence of pulmonic stenosis. Aorta: The aortic root is normal in size and structure. Venous: The inferior vena cava is normal in size with greater than 50% respiratory variability, suggesting right atrial pressure of 3 mmHg. IAS/Shunts: No atrial level shunt detected by color flow Doppler.  LEFT VENTRICLE PLAX 2D LVIDd:         4.40 cm     Diastology LVIDs:         3.30 cm     LV e' medial:    8.92 cm/s LV PW:         1.00 cm     LV E/e' medial:  9.6 LV IVS:        0.90 cm     LV e' lateral:   10.70 cm/s LVOT diam:     2.00 cm     LV E/e' lateral: 8.0 LV SV:         81 LV SV Index:   36 LVOT Area:     3.14 cm  LV Volumes (MOD) LV vol d, MOD A2C: 51.7 ml LV vol d, MOD A4C: 46.4 ml LV vol s, MOD A2C: 24.0 ml LV vol s, MOD A4C: 15.6 ml LV SV MOD A2C:     27.7 ml LV SV MOD A4C:     46.4 ml LV SV MOD BP:      32.8 ml RIGHT VENTRICLE             IVC RV S prime:     12.50 cm/s  IVC diam: 1.80 cm TAPSE (M-mode): 1.8 cm LEFT ATRIUM             Index LA diam:        3.50 cm 1.54 cm/m LA Vol (A2C):   33.7 ml 14.79 ml/m LA Vol (A4C):   48.4  ml 21.24 ml/m LA Biplane Vol: 41.8 ml 18.34 ml/m  AORTIC VALVE AV Area (Vmax):    1.84 cm AV Area (Vmean):   2.36 cm AV Area (VTI):     1.93 cm AV Vmax:           205.11 cm/s AV Vmean:          118.622 cm/s AV VTI:            0.422 m AV Peak Grad:  16.8 mmHg AV Mean Grad:      6.9 mmHg LVOT Vmax:         120.00 cm/s LVOT Vmean:        89.100 cm/s LVOT VTI:          0.259 m LVOT/AV VTI ratio: 0.61  AORTA Ao Root diam: 3.10 cm MITRAL VALVE MV Area (PHT): 2.69 cm     SHUNTS MV Decel Time: 282 msec     Systemic VTI:  0.26 m MV E velocity: 85.60 cm/s   Systemic Diam: 2.00 cm MV A velocity: 107.00 cm/s MV E/A ratio:  0.80 Carlyle Dolly MD Electronically signed by Carlyle Dolly MD Signature Date/Time: 02/02/2022/1:00:37 PM    Final    CT ABDOMEN PELVIS W CONTRAST  Result Date: 02/01/2022 CLINICAL DATA:  Generalized weakness. EXAM: CT ABDOMEN AND PELVIS WITH CONTRAST TECHNIQUE: Multidetector CT imaging of the abdomen and pelvis was performed using the standard protocol following bolus administration of intravenous contrast. RADIATION DOSE REDUCTION: This exam was performed according to the departmental dose-optimization program which includes automated exposure control, adjustment of the mA and/or kV according to patient size and/or use of iterative reconstruction technique. CONTRAST:  147m OMNIPAQUE IOHEXOL 350 MG/ML SOLN COMPARISON:  None Available. FINDINGS: Lower chest: Mild atelectasis is seen within the bilateral lung bases. Hepatobiliary: There is diffuse fatty infiltration of the liver parenchyma. No focal liver abnormality is seen. Ill-defined gallstones are seen within the gallbladder lumen, without evidence of gallbladder wall thickening or biliary dilatation. Pancreas: Diffuse fatty infiltration of the pancreatic parenchyma is seen. No pancreatic ductal dilatation or surrounding inflammatory changes. Spleen: Normal in size without focal abnormality. Adrenals/Urinary Tract: The bilateral adrenal  glands are unremarkable. A 13.1 cm by 9.0 cm x 12.2 cm simple cyst is seen within the posterolateral aspect of the right kidney. A 4.8 cm x 3.4 cm x 4.1 cm exophytic, heterogeneous mildly hyperdense lesion (approximately 82.46 Hounsfield units) is seen within the anterior aspect of the mid to upper right kidney. A 1.8 cm x 1.4 cm x 2.1 cm cystic appearing area is seen adjacent to the posterolateral aspect of the mid left kidney (approximately 49.00 Hounsfield units). Bilateral 2 mm, 3 mm and 4 mm nonobstructing renal calculi are seen. There is no evidence of hydronephrosis or hydroureter. The urinary bladder is poorly distended and subsequently limited in evaluation. Stomach/Bowel: Stomach is within normal limits. Appendix appears normal. No evidence of bowel wall thickening, distention, or inflammatory changes. Vascular/Lymphatic: No significant vascular findings are present. Multiple tiny para-aortic and aortocaval lymph nodes are noted. A 2.2 cm x 1.5 cm x 1.9 cm posterior left iliac chain lymph node is seen. Several adjacent tiny pelvic lymph nodes are also noted. Reproductive: Uterus and bilateral adnexa are unremarkable. Other: There is a small fat containing umbilical hernia. No abdominopelvic ascites. Musculoskeletal: No acute or significant osseous findings. IMPRESSION: 1. 4.8 cm x 3.4 cm x 4.1 cm exophytic lesion within the anterior aspect of the mid to upper right kidney which may represent a hemorrhagic cyst. Correlation with MRI (without and with IV contrast) is recommended to exclude the presence of an underlying neoplastic process. 2. Very large simple right renal cyst with a much smaller exophytic cystic-appearing area seen along the posteromedial aspect of the mid left kidney. 3. Bilateral nonobstructing renal calculi. 4. Cholelithiasis. 5. Enlarged posterior left iliac chain lymph node which may be reactive in nature. Electronically Signed   By: TVirgina NorfolkM.D.   On: 02/01/2022 20:36  CT  Angio Chest PE W and/or Wo Contrast  Result Date: 02/01/2022 CLINICAL DATA:  Generalized weakness and history of recurrent DVT. EXAM: CT ANGIOGRAPHY CHEST WITH CONTRAST TECHNIQUE: Multidetector CT imaging of the chest was performed using the standard protocol during bolus administration of intravenous contrast. Multiplanar CT image reconstructions and MIPs were obtained to evaluate the vascular anatomy. RADIATION DOSE REDUCTION: This exam was performed according to the departmental dose-optimization program which includes automated exposure control, adjustment of the mA and/or kV according to patient size and/or use of iterative reconstruction technique. CONTRAST:  133m OMNIPAQUE IOHEXOL 350 MG/ML SOLN COMPARISON:  None Available. FINDINGS: Cardiovascular: There is mild calcification of the aortic arch, without evidence of aortic aneurysm. The segmental and subsegmental pulmonary arteries are limited in evaluation secondary to suboptimal opacification with intravenous contrast and motion artifact. No evidence of pulmonary embolism. Prominence of the perihilar pulmonary vasculature is noted. There is mild cardiomegaly. No pericardial effusion. Mediastinum/Nodes: No enlarged mediastinal, hilar, or axillary lymph nodes. Thyroid gland, trachea, and esophagus demonstrate no significant findings. Lungs/Pleura: Low lung volumes are seen which may be, in part, secondary to the degree of patient inspiration. Mild atelectatic changes are seen within the bilateral lung bases. There is no evidence of acute infiltrate, pleural effusion or pneumothorax. Upper Abdomen: No acute abnormality. Musculoskeletal: Degenerative changes are seen involving both shoulders, with multilevel degenerative changes noted throughout the thoracic spine. Review of the MIP images confirms the above findings. IMPRESSION: 1. Limited evaluation of the segmental and subsegmental pulmonary arteries, as described above, without definite evidence of  pulmonary embolism. 2. Mild cardiomegaly with mild to moderate severity pulmonary vascular congestion. 3. Mild bibasilar atelectasis. 4. Aortic atherosclerosis. Aortic Atherosclerosis (ICD10-I70.0). Electronically Signed   By: TVirgina NorfolkM.D.   On: 02/01/2022 20:18   DG Chest Port 1 View  Result Date: 02/01/2022 CLINICAL DATA:  Possible sepsis. EXAM: PORTABLE CHEST 1 VIEW COMPARISON:  Radiograph 12/28/2019 FINDINGS: Low lung volumes with moderate patient rotation. Suspected cardiomegaly. Patchy left lung base opacity. No pulmonary edema. No large pleural effusion or visualized pneumothorax. On limited assessment, no acute osseous findings. IMPRESSION: Low lung volumes with moderate patient rotation. Patchy left lung base opacity may be atelectasis or pneumonia. Electronically Signed   By: MKeith RakeM.D.   On: 02/01/2022 17:19   CT Head Wo Contrast  Result Date: 02/01/2022 CLINICAL DATA:  Altered mental status EXAM: CT HEAD WITHOUT CONTRAST TECHNIQUE: Contiguous axial images were obtained from the base of the skull through the vertex without intravenous contrast. RADIATION DOSE REDUCTION: This exam was performed according to the departmental dose-optimization program which includes automated exposure control, adjustment of the mA and/or kV according to patient size and/or use of iterative reconstruction technique. COMPARISON:  None Available. FINDINGS: Brain: No acute intracranial findings are seen in noncontrast CT brain. There are no signs of bleeding within the cranium. Ventricles are nondilated. Cortical sulci are prominent. Vascular: Scattered arterial calcifications are seen. Skull: Unremarkable. Sinuses/Orbits: Unremarkable. Other: None. IMPRESSION: No acute intracranial findings are seen in noncontrast CT brain. Atrophy. Electronically Signed   By: PElmer PickerM.D.   On: 02/01/2022 16:58    Microbiology: Results for orders placed or performed during the hospital encounter of  02/01/22  Culture, blood (Routine x 2)     Status: None   Collection Time: 02/01/22  4:03 PM   Specimen: BLOOD  Result Value Ref Range Status   Specimen Description BLOOD LEFT ANTECUBITAL  Final   Special Requests  Final    BOTTLES DRAWN AEROBIC AND ANAEROBIC Blood Culture adequate volume   Culture   Final    NO GROWTH 5 DAYS Performed at [facility], [place] [address] [place], [place] [address]    Report Status 02/06/2022 FINAL  Final  Culture, blood (Routine x 2)     Status: None   Collection Time: 02/01/22  4:11 PM   Specimen: BLOOD  Result Value Ref Range Status   Specimen Description BLOOD RIGHT ANTECUBITAL  Final   Special Requests   Final    BOTTLES DRAWN AEROBIC AND ANAEROBIC Blood Culture results may not be optimal due to an excessive volume of blood received in culture bottles   Culture   Final    NO GROWTH 5 DAYS Performed at [facility], [place] [address], [place], [place] [address]    Report Status 02/06/2022 FINAL  Final  Urine Culture     Status: Abnormal   Collection Time: 02/01/22  4:21 PM   Specimen: In/Out Cath Urine  Result Value Ref Range Status   Specimen Description IN/OUT CATH URINE  Final   Special Requests   Final    NONE Performed at [facility], [place] [address] [place], [place] [address]    Culture 100 COLONIES/mL ESCHERICHIA COLI (A)  Final   Report Status 02/04/2022 FINAL  Final   Organism ID, Bacteria ESCHERICHIA COLI (A)  Final      Susceptibility   Escherichia coli - MIC*    AMPICILLIN 4 SENSITIVE Sensitive     CEFAZOLIN <=4 SENSITIVE Sensitive     CEFEPIME <=0.12 SENSITIVE Sensitive     CEFTRIAXONE <=0.25 SENSITIVE Sensitive     CIPROFLOXACIN <=0.25 SENSITIVE Sensitive     GENTAMICIN <=1 SENSITIVE Sensitive     IMIPENEM <=0.25 SENSITIVE Sensitive     NITROFURANTOIN 32 SENSITIVE Sensitive     TRIMETH/SULFA <=20 SENSITIVE Sensitive     AMPICILLIN/SULBACTAM <=2 SENSITIVE Sensitive     PIP/TAZO <=4 SENSITIVE Sensitive      * 100 COLONIES/mL ESCHERICHIA COLI    Labs: CBC: Recent Labs  Lab 02/08/22 0550 02/09/22 0458 02/10/22 0229 02/11/22 0148 02/12/22 0118  WBC 30.5* 25.9* 17.5* 9.6 8.9  HGB 12.6 11.8* 12.0 11.7* 12.9  HCT 37.2 35.2* 36.2 35.0* 37.7  MCV 97.6 98.6 98.4 97.5 97.4  PLT 248 276 291 279 539   Basic Metabolic Panel: Recent Labs  Lab 02/08/22 0550 02/09/22 0458 02/10/22 0229 02/11/22 0148 02/12/22 0118  NA 135 134* 133* 136 138  K 4.0 4.9 5.2* 3.9 4.1  CL 104 102 102 103 104  CO2 21* '23 24 24 26  '$ GLUCOSE 81 212* 211* 216* 210*  BUN 16 22 28* 28* 25*  CREATININE 0.95 0.77 0.72 0.66 0.61  CALCIUM 7.9* 8.1* 7.9* 7.7* 8.0*  MG 1.8 2.3 2.3  --  2.2   Liver Function Tests: No results for input(s): "AST", "ALT", "ALKPHOS", "BILITOT", "PROT", "ALBUMIN" in the last 168 hours. CBG: Recent Labs  Lab 02/11/22 1255 02/11/22 1601 02/11/22 2126 02/12/22 0741 02/12/22 1144  GLUCAP 165* 208* 233* 82 110*    Discharge time spent: greater than 30 minutes.  Signed: Flora Lipps, MD [facility] 02/12/2022

## 2022-02-12 NOTE — TOC Progression Note (Signed)
Transition of Care [facility]) - Progression Note    Patient Details  Name: Brittany Tran MRN: [id number] Date of Birth: 05-Feb-1961  Transition of Care [facility]) CM/SW Contact  Reece Agar, [place] Phone Number: 02/12/2022, 1:38 PM  Clinical Narrative:    Pt was accepted at [facility] and rehab, [place] in admissions has started [place] and stated that she will likely get auth back tomorrow. CSW updated pt and discussed transportation again to confirm bc [facility] will not take pt to [facility] [place] under insurance. Pt confirmed that she has transportation but will need to get it set up today. Pt will set up transportation for 1pm tomorrow, CSW will keep updated on DC status.   Expected Discharge Plan: [place] Barriers to Discharge: Continued Medical Work up  Expected Discharge Plan and Services Expected Discharge Plan: [place]   Discharge Planning Services: CM Consult Post Acute Care Choice: [place] arrangements for the past 2 months: Apartment                                       Social Determinants of Health (SDOH) Interventions    Readmission Risk Interventions     No data to display

## 2022-02-12 NOTE — Progress Notes (Signed)
Mobility Specialist Progress Note:   02/12/22 1307  Mobility  Activity Ambulated with assistance in room  Level of Assistance Moderate assist, patient does 50-74%  Assistive Device Front wheel walker  Distance Ambulated (ft) 10 ft  Activity Response Tolerated well  $Mobility charge 1 Mobility   Pt in bed and agreeable. ModA to stand from sitting EOB, CG during ambulation. No complaints. Pt left in chair with all needs met, call bell in reach, and chair alarm on.   Lamin Chandley Mobility Specialist-Acute Rehab Secure Chat only

## 2022-02-12 NOTE — Progress Notes (Signed)
Physical Therapy Treatment Patient Details Name: Brittany Tran MRN: [id number] DOB: 01-Aug-1960 Today's Date: 02/12/2022   History of Present Illness Patient is a 61 y/o female who presents on 8/18 for generalized weakness and found slumped in her chair minimally responsive by her neighbors. Found to have UTI and sepsis secondary to LLE cellulitis. 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 PMH includes DM, HF, HTN, DVT, fibromyalgia, RA.    PT Comments    Pt progressing towards goals, however, remains limited secondary to pain in LLE. Pt requiring mod A for transfers and min to min guard for short distance ambulation. Current recommendations for SNF appropriate. Will continue to follow acutely.     Recommendations for follow up therapy are one component of a multi-disciplinary discharge planning process, led by the attending physician.  Recommendations may be updated based on patient status, additional functional criteria and insurance authorization.  Follow Up Recommendations  Skilled nursing-short term rehab (<3 hours/day) Can patient physically be transported by private vehicle: No   Assistance Recommended at Discharge Frequent or constant Supervision/Assistance  Patient can return home with the following A lot of help with walking and/or transfers;Assistance with cooking/housework;Assist for transportation;Help with stairs or ramp for entrance;A little help with bathing/dressing/bathroom   Equipment Recommendations  Rolling walker (2 wheels);BSC/3in1    Recommendations for Other Services       Precautions / Restrictions Precautions Precautions: Fall Restrictions Weight Bearing Restrictions: No     Mobility  Bed Mobility               General bed mobility comments: up in chair on entry    Transfers Overall transfer level: Needs assistance Equipment used: Rolling walker (2 wheels) Transfers: Sit to/from Stand Sit to Stand: Mod assist            General transfer comment: Mod A for lift assist and steadying from lower surface    Ambulation/Gait Ambulation/Gait assistance: Min guard, Min assist Gait Distance (Feet): 15 Feet Assistive device: Rolling walker (2 wheels) Gait Pattern/deviations: Step-to pattern, Decreased step length - right, Decreased step length - left, Decreased weight shift to left Gait velocity: reduced     General Gait Details: Slow, antalgic gait. Distance limited secondary to pain   Stairs             Wheelchair Mobility    Modified Rankin (Stroke Patients Only)       Balance Overall balance assessment: Needs assistance Sitting-balance support: No upper extremity supported, Feet supported Sitting balance-Leahy Scale: Fair     Standing balance support: Bilateral upper extremity supported, Reliant on assistive device for balance Standing balance-Leahy Scale: Poor                              Cognition Arousal/Alertness: Awake/alert Behavior During Therapy: WFL for tasks assessed/performed Overall Cognitive Status: Within Functional Limits for tasks assessed                                          Exercises      General Comments        Pertinent Vitals/Pain Pain Assessment Pain Assessment: 0-10 Pain Score: 10-Worst pain ever Pain Location: L LE with standing Pain Descriptors / Indicators: Sore Pain Intervention(s): Limited activity within patient's tolerance, Monitored during session, Repositioned    Home Living  Prior Function            PT Goals (current goals can now be found in the care plan section) Acute Rehab PT Goals Patient Stated Goal: to go home PT Goal Formulation: With patient Time For Goal Achievement: 02/18/22 Potential to Achieve Goals: Fair Progress towards PT goals: Progressing toward goals    Frequency    Min 2X/week      PT Plan Current plan remains appropriate     Co-evaluation              AM-PAC PT "6 Clicks" Mobility   Outcome Measure  Help needed turning from your back to your side while in a flat bed without using bedrails?: A Little Help needed moving from lying on your back to sitting on the side of a flat bed without using bedrails?: A Little Help needed moving to and from a bed to a chair (including a wheelchair)?: A Little Help needed standing up from a chair using your arms (e.g., wheelchair or bedside chair)?: A Lot Help needed to walk in hospital room?: A Lot Help needed climbing 3-5 steps with a railing? : Total 6 Click Score: 14    End of Session Equipment Utilized During Treatment: Gait belt Activity Tolerance: Patient tolerated treatment well Patient left: in chair;with call bell/phone within reach;with chair alarm set Nurse Communication: Mobility status PT Visit Diagnosis: Pain;Muscle weakness (generalized) (M62.81);Difficulty in walking, not elsewhere classified (R26.2) Pain - Right/Left: Left Pain - part of body: Leg     Time: 1350-1405 PT Time Calculation (min) (ACUTE ONLY): 15 min  Charges:  $Gait Training: 8-22 mins                     Lou Miner, DPT  Acute Rehabilitation Services  Office: [phone]    Rudean Hitt 02/12/2022, 4:31 PM

## 2022-02-12 NOTE — Progress Notes (Signed)
PROGRESS NOTE Brittany Tran  MPN:[id number] DOB: 1961-02-21 DOA: 02/01/2022 PCP: Curt Jews, PA-C   Brief Narrative/Hospital Course:  61 years old female with past medical history of diabetes mellitus type 2, morbid obesity with BMI 43, chronic venous stasis ulceration worse on LLE, was found by neighbors slumped in her chair and minimally responsive after they did not hear from her in 12 hours. EMS was called and  transported to [facility] for evaluation with a concern for UTI and or leg infection and sepsis.In the ED, he was febrile with a temperature of 103.3 F and was tachycardic.  Patient was very confused and disoriented initially.  Patient was then admitted to the hospital for UTI and left lower extremity cellulitis.  Code sepsis was initiated initially.   During hospitalization, CT scan of the abdomen showed possible exophytic mass of the right kidney.  Patient continued to have leg pain and generalized body pain.  MRI of the abdomen showed large renal tumor.  Spoke with urology who recommends a follow-up with Dr. Gilford Rile and Dr. Tresa Moore as outpatient for further evaluation.  Patient also developed erythematous rash diffusely over her body after administration of vancomycin which was changed to cefazolin but still had more rash so at this time cefazolin has been discontinued, allergy documented  and has been started on doxycycline.  Patient did have some exfoliation of the skin but no other issues and continues to feel better.  Doxycycline has been discontinued at this time after completion of antibiotic.Marland Kitchen  Physical therapy has seen the patient and recommended skilled nursing facility at this time.  Assessment and Plan: Principal Problem:   Cellulitis of left leg Active Problems:   Sepsis due to cellulitis ([place])   Cellulitis of left lower extremity   Diabetes mellitus without complication (HCC)   Chronic diastolic CHF (congestive heart failure) (HCC)   Anticoagulation goal of INR 2 to 3    Renal lesion   Rheumatoid arthritis (HCC)   Hypertension   Skin yeast infection   Hypokalemia   Sepsis secondary to left lower extremity cellulitis and UTI, POA: History of chronic venous stasis ulceration.  Patient presented with signs of sepsis.  Patient was initially on  vancomycin and Rocephin.   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  Patient was then transitioned to doxycycline.  Has completed course of doxycycline so this has been discontinued.  Patient received IV Solu-Medrol and antihistamines for the skin rash and has improved at this time..   Blood cultures negative in >5 days.  Temperature max of 70F.  WBC at 8.9 from 9.6 <17.5 < 20.2 K improving.  Blood pressure has improved at this time.  Diffuse erythematous rash.  Mild scaling and exfoliation with erythema but  no mucosal involvement.  Patient continues to feels better.  Patient is allergic to multiple antibiotic including vancomycin penicillin and will add cephalosporin as well.    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  Received IV Solu-Medrol and antihistamines.    Acute metabolic encephalopathy.  Likely multifactorial from sepsis, UTI cellulitis.  Resolved at this time.   Exophytic lesion on the anterior aspect of the mid upper right kidney CT abdomen patient with 4.8 x 3.4 x 4.1 centimeter exophytic lesion anterior aspect mid-upper right kidney.  Differential diagnosis was hemorrhagic cyst vs tumor.  Urinalysis showed RBC 11-20 per high-power field. 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 Size of 4.6 x 3.5 x 4.6 cm.  Spoke with urology Dr. Matilde Sprang, urology on 02/06/2022 who recommends following up with Dr. Gilford Rile or Dr. Tresa Moore, urology for further evaluation  as outpatient.  We will need to set up an appointment prior to discharge.  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 pharmacy dosing, latest INR of 2.9  Chronic diastolic CHF- w/ mild acute component with orthopnea  Extensive lower leg edema: Patient has shortness of breath and dyspnea on exertion but is sedentary.  Last 2D echocardiogram on 02/02/2022 showed LV ejection fraction of 60 to 65% with no regional wall motion abnormality, with grade 1 diastolic dysfunction.  CTA was limited but no evidence of pulmonary embolism but had some vascular congestion.    Patient is negative balance for 2321 mL.  Continue to hold Lasix  due to borderline low blood pressure..   Diabetes mellitus type II on long-term insulin with episodes of hypoglycemia and hyperglycemia Insulin dose has been reduced at this time.  Latest POC glucose of 82.  Cutaneous yeast infection under panniculus: Continue nystatin powder  Hypertension: Had borderline hypotension which has improved .  Continue to hold antihypertensives, hold Lasix.  Rheumatoid arthritis:  Continue prednisone after IV Solu-Medrol.  Continue to hold methotrexate due to infection, patient was not taking methotrexate since she had not had a follow-up done  Morbid obesity BMI 43- Patient will benefit from ongoing weight loss as outpatient.  Fibromyalgia, generalized body pain, rheumatoid arthritis.   Continue tramadol and oxycodone for pain management.  Has history of rheumatoid arthritis and chronic pain syndrome.  We will continue prednisone after IV Solu-Medrol.  Deconditioning, debility physical therapy has recommended skilled nursing facility placement.    Mild hypokalemia.  Resolved after replacement.  Mild AKI.  Latest creatinine of 0.6.  Resolved.    Constipation on MiraLAX and Colace.  As needed Dulcolax suppository as needed.  DVT prophylaxis: coumadin  Code Status:   Code Status: Full Code  Family Communication:  I spoke  with the patient's mother on the phone and updated her about the clinical condition of the patient on 02/08/2022.  Patient status is: Inpatient because of Debility, weakness, new renal mass, pending clinical improvement, need for rehabilitation,   Level of care: Telemetry Medical   Dispo: The patient is from: home   Anticipated disposition: PT OT recommends skilled nursing facility on discharge.  Patient wishes to be near [place] next to her family members.  TOC aware.  Subjective:  Today, patient was seen and examined at bedside.  Patient feels overall better.  Denies any nausea vomiting fever chills or rigor.  Does not feel depressed or does not have any feelings of harm.  Denies any oral pain or ulceration.  Wishes to go to [place] for rehabilitation nearby family members.  Objective:  Vitals last 24 hrs: Vitals:   02/11/22 1604 02/11/22 1947 02/12/22 0454 02/12/22 0743  BP: 130/79 136/73 102/67 (!) 109/59  Pulse: 64 61 64 66  Resp: '18 17 18 17  '$ Temp: (!) 97.5 F (36.4 C) 97.6 F (36.4 C) 97.9 F (36.6 C) 98 F (36.7 C)  TempSrc: Oral Oral Oral Oral  SpO2: 96% 94% 100% 99%  Weight:      Height:       Physical Examination:  Body mass index is 43.91 kg/m.   General: Obese  built, not in obvious distress HENT:   No scleral pallor or icterus noted. Oral mucosa is moist.  Chest:  Diminished breath sounds bilaterally. No crackles or wheezes.  CVS: S1 &S2 heard. No murmur.  Regular rate and rhythm. Abdomen: Soft, nontender, nondistended.  Bowel sounds are heard.   Extremities: No cyanosis, clubbing or edema.  Peripheral pulses are palpable.  Left leg ulcer with cellulitis Psych: Alert, awake and oriented, normal mood CNS:  No cranial nerve deficits.  Power equal in all extremities.   Skin: Warm and dry.  No rashes noted.  Chronic venous insufficiency stasis changes.  Erythematous rash over the body and face with diffuse scaling.  Erythema has improved  Medications reviewed:   Scheduled Meds:  bisacodyl  10 mg Rectal Daily   docusate sodium  100 mg Oral BID   feeding supplement  237 mL Oral BID BM   folic acid  2 mg Oral Daily   furosemide  20 mg Intravenous Daily   gabapentin  300 mg Oral TID   gabapentin  400 mg Oral QHS   hydrocerin   Topical BID   insulin aspart  0-9 Units Subcutaneous TID WC   insulin glargine-yfgn  5 Units Subcutaneous QHS   multivitamin with minerals  1 tablet Oral Daily   nystatin   Topical BID   polyethylene glycol  17 g Oral Daily   predniSONE  5 mg Oral Q breakfast   topiramate  25 mg Oral BID   warfarin  2.5 mg Oral ONCE-1600   Warfarin - Pharmacist Dosing Inpatient   Does not apply q1600   Continuous Infusions:   Data Reviewed:  I have personally reviewed the following labs and imaging studies.    CBC: Recent Labs  Lab 02/08/22 0550 02/09/22 0458 02/10/22 0229 02/11/22 0148 02/12/22 0118  WBC 30.5* 25.9* 17.5* 9.6 8.9  HGB 12.6 11.8* 12.0 11.7* 12.9  HCT 37.2 35.2* 36.2 35.0* 37.7  MCV 97.6 98.6 98.4 97.5 97.4  PLT 248 276 291 279 053    Basic Metabolic Panel: Recent Labs  Lab 02/08/22 0550 02/09/22 0458 02/10/22 0229 02/11/22 0148 02/12/22 0118  NA 135 134* 133* 136 138  K 4.0 4.9 5.2* 3.9 4.1  CL 104 102 102 103 104  CO2 21* '23 24 24 26  '$ GLUCOSE 81 212* 211* 216* 210*  BUN 16 22 28* 28* 25*  CREATININE 0.95 0.77 0.72 0.66 0.61  CALCIUM 7.9* 8.1* 7.9* 7.7* 8.0*  MG 1.8 2.3 2.3  --  2.2    GFR: Estimated Creatinine Clearance: 97.7 mL/min (by C-G formula based on SCr of 0.61 mg/dL). Liver Function Tests: No results for input(s): "AST", "ALT", "ALKPHOS", "BILITOT", "PROT", "ALBUMIN" in the last 168 hours.  No results for input(s): "LIPASE", "AMYLASE" in the last 168 hours. No results for input(s): "AMMONIA" in the last 168 hours. Coagulation Profile: Recent Labs  Lab 02/08/22 0550 02/09/22 0458 02/10/22 0229 02/11/22 0148 02/12/22 0118  INR 3.1* 2.0* 2.1* 2.4* 2.9*    BNP (last 3  results) No results for input(s): "PROBNP" in the last 8760 hours. HbA1C: No results for input(s): "HGBA1C" in the last 72 hours.  CBG: Recent Labs  Lab 02/11/22 0747 02/11/22 1255 02/11/22 1601 02/11/22 2126 02/12/22 0741  GLUCAP 137* 165* 208* 233* 82    Lipid Profile: No results for input(s): "CHOL", "HDL", "LDLCALC", "TRIG", "CHOLHDL", "LDLDIRECT" in the last 72 hours. Thyroid Function Tests: No results for input(s): "TSH", "T4TOTAL", "FREET4", "T3FREE", "THYROIDAB" in the last  72 hours. Sepsis Labs: No results for input(s): "PROCALCITON", "LATICACIDVEN" in the last 168 hours.   No results found for this or any previous visit (from the past 240 hour(s)).   Antimicrobials: Anti-infectives (From admission, onward)    Start     Dose/Rate Route Frequency Ordered Stop   02/08/22 1000  doxycycline (VIBRA-TABS) tablet 100 mg  Status:  Discontinued        100 mg Oral Every 12 hours 02/08/22 0802 02/11/22 0738   02/03/22 1700  cefTRIAXone (ROCEPHIN) 2 g in sodium chloride 0.9 % 100 mL IVPB  Status:  Discontinued        2 g 200 mL/hr over 30 Minutes Intravenous Every 24 hours 02/03/22 1018 02/07/22 0816   02/02/22 1700  cefTRIAXone (ROCEPHIN) 1 g in sodium chloride 0.9 % 100 mL IVPB  Status:  Discontinued        1 g 200 mL/hr over 30 Minutes Intravenous Every 24 hours 02/02/22 0154 02/03/22 1018   02/02/22 1100  vancomycin (VANCOREADY) IVPB 750 mg/150 mL  Status:  Discontinued        750 mg 75 mL/hr over 120 Minutes Intravenous Every 12 hours 02/01/22 2229 02/06/22 1715   02/01/22 2145  vancomycin (VANCOREADY) IVPB 1750 mg/350 mL        1,750 mg 116.7 mL/hr over 180 Minutes Intravenous  Once 02/01/22 2143 02/02/22 0054   02/01/22 1645  cefTRIAXone (ROCEPHIN) 2 g in sodium chloride 0.9 % 100 mL IVPB        2 g 200 mL/hr over 30 Minutes Intravenous  Once 02/01/22 1633 02/01/22 1843      Radiology Studies: No results found.   LOS: 10 days   Flora Lipps, MD [facility] 02/12/2022, 10:57 AM

## 2022-02-12 NOTE — Progress Notes (Signed)
[place] for Warfarin Indication:  history of recurrent DVTs  Allergies  Allergen Reactions   Penicillins Hives    Has patient had a PCN reaction causing immediate rash, facial/tongue/throat swelling, SOB or lightheadedness with hypotension: Yes Has patient had a PCN reaction causing severe rash involving mucus membranes or skin necrosis: No Has patient had a PCN reaction that required hospitalization: No Has patient had a PCN reaction occurring within the last 10 years: No If all of the above answers are "NO", then may proceed with Cephalosporin use.    Vancomycin Rash    Developed a red rash bilaterally on her arms, legs, neck, chest and abdomen   Cefazolin Rash   Penicillin G Benzathine Rash   Sulfa Antibiotics Hives and Rash    Other reaction(s): Other (See Comments) Other reaction(s): Unknown    Patient Measurements: Height: '5\' 6"'$  (167.6 cm) Weight: 123.4 kg (272 lb 0.8 oz) IBW/kg (Calculated) : 59.3  Vital Signs: Temp: 98 F (36.7 C) (08/29 0743) Temp Source: Oral (08/29 0743) BP: 109/59 (08/29 0743) Pulse Rate: 66 (08/29 0743)  Labs: Recent Labs    02/10/22 0229 02/11/22 0148 02/12/22 0118  HGB 12.0 11.7* 12.9  HCT 36.2 35.0* 37.7  PLT 291 279 246  LABPROT 23.3* 25.7* 29.8*  INR 2.1* 2.4* 2.9*  CREATININE 0.72 0.66 0.61     Estimated Creatinine Clearance: 97.7 mL/min (by C-G formula based on SCr of 0.61 mg/dL).  Assessment: 61 yo F with medical history significant for history of recurrent DVTs on warfarin PTA who presented minimally responsive suspected to have UTI or cellulitis infection. Last warfarin dose PTA 8/17. Pharmacy consulted to manage warfarin.  PTA warfarin regimen per [facility] visit 01/09/22: - warfarin 5 mg PO Mon, Weds, Fri, Sat - warfarin 2.5 mg PO Tue, Thurs, Sun  INR supratherapeutic x3 days, warfarin dose held 8/23-8/24. INR is therapeutic today at 2.9. CBC stable, no issues with bleeding reported.  Doxycycline 8/25-27.  Goal of Therapy:  INR 2-3 Monitor platelets by anticoagulation protocol: Yes   Plan:  Give warfarin 2.5 mg po x 1 Monitor daily INR, CBC, clinical course, s/sx of bleed, PO intake/diet, Drug-Drug Interactions    Thank you for allowing [place] to participate in this patients care. Jens Som, PharmD 02/12/2022 10:49 AM  **Pharmacist phone directory can be found on [email or web address] listed under [place]**

## 2022-02-13 DIAGNOSIS — L03116 Cellulitis of left lower limb: Secondary | ICD-10-CM | POA: Diagnosis not present

## 2022-02-13 LAB — PROTIME-INR
INR: 3.2 — ABNORMAL HIGH (ref 0.8–1.2)
Prothrombin Time: 32.4 seconds — ABNORMAL HIGH (ref 11.4–15.2)

## 2022-02-13 LAB — GLUCOSE, CAPILLARY
Glucose-Capillary: 105 mg/dL — ABNORMAL HIGH (ref 70–99)
Glucose-Capillary: 142 mg/dL — ABNORMAL HIGH (ref 70–99)

## 2022-02-13 MED ORDER — WARFARIN SODIUM 1 MG PO TABS
1.0000 mg | ORAL_TABLET | Freq: Once | ORAL | Status: DC
  Filled 2022-02-13: qty 1

## 2022-02-13 NOTE — Progress Notes (Signed)
[place] for Warfarin Indication:  history of recurrent DVTs  Allergies  Allergen Reactions   Penicillins Hives    Has patient had a PCN reaction causing immediate rash, facial/tongue/throat swelling, SOB or lightheadedness with hypotension: Yes Has patient had a PCN reaction causing severe rash involving mucus membranes or skin necrosis: No Has patient had a PCN reaction that required hospitalization: No Has patient had a PCN reaction occurring within the last 10 years: No If all of the above answers are "NO", then may proceed with Cephalosporin use.    Vancomycin Rash    Developed a red rash bilaterally on her arms, legs, neck, chest and abdomen   Cefazolin Rash   Penicillin G Benzathine Rash   Sulfa Antibiotics Hives and Rash    Other reaction(s): Other (See Comments) Other reaction(s): Unknown    Patient Measurements: Height: '5\' 6"'$  (167.6 cm) Weight: 123.4 kg (272 lb 0.8 oz) IBW/kg (Calculated) : 59.3  Vital Signs: Temp: 97.5 F (36.4 C) (08/30 0732) Temp Source: Oral (08/30 0732) BP: 107/63 (08/30 0732) Pulse Rate: 73 (08/30 0732)  Labs: Recent Labs    02/11/22 0148 02/12/22 0118 02/13/22 0320  HGB 11.7* 12.9  --   HCT 35.0* 37.7  --   PLT 279 246  --   LABPROT 25.7* 29.8* 32.4*  INR 2.4* 2.9* 3.2*  CREATININE 0.66 0.61  --     Estimated Creatinine Clearance: 97.7 mL/min (by C-G formula based on SCr of 0.61 mg/dL).  Assessment: 61 yo F with medical history significant for history of recurrent DVTs on warfarin PTA who presented minimally responsive suspected to have UTI or cellulitis infection. Last warfarin dose PTA 8/17. Pharmacy consulted to manage warfarin.  PTA warfarin regimen per [facility] visit 01/09/22: - warfarin 5 mg PO Mon, Weds, Fri, Sat - warfarin 2.5 mg PO Tue, Thurs, Sun  INR supratherapeutic x3 days, warfarin dose held 8/23-8/24.  INR is supratherapeutic at 3.2 today. CBC stable, no issues with bleeding  reported. Doxycycline 8/25-27.  Goal of Therapy:  INR 2-3 Monitor platelets by anticoagulation protocol: Yes   Plan:  Give warfarin 1 mg po x 1 Monitor daily INR, CBC, clinical course, s/sx of bleed, PO intake/diet, Drug-Drug Interactions  Erskine Speed, PharmD Clinical Pharmacist 02/13/2022 12:19 PM  **Pharmacist phone directory can be found on [email or web address] listed under [place]**

## 2022-02-13 NOTE — Social Work (Signed)
Letter of Guarantee approved by [facility] [employer]. Documentation completed and delivered to [place]. Facility updated on next steps. No further interventions needed.

## 2022-02-13 NOTE — Discharge Summary (Signed)
DC  NOTE    Brittany Tran  LGX:[id number] DOB: 30-May-1961 DOA: 02/01/2022 PCP: Curt Jews, PA-C   Brief Narrative:  61 years old female with past medical history of diabetes mellitus type 2, morbid obesity with BMI 43, chronic venous stasis ulceration worse on LLE, was found by neighbors slumped in her chair and minimally responsive after they did not hear from her in 12 hours. EMS was called and  transported to [facility] for evaluation with a concern for UTI and or leg infection and sepsis.In the ED, he was febrile with a temperature of 103.3 F and was tachycardic.  Patient was very confused and disoriented initially.  Patient was then admitted to the hospital for UTI and left lower extremity cellulitis.  Code sepsis was initiated initially.    During hospitalization, CT scan of the abdomen showed possible exophytic mass of the right kidney.  Patient continued to have leg pain and generalized body pain.  MRI of the abdomen showed large renal tumor.  Spoke with urology who recommends a follow-up with Dr. Gilford Rile and Dr. Tresa Moore as outpatient for further evaluation.  Patient also developed erythematous rash diffusely over her body after administration of vancomycin which was changed to cefazolin but still had more rash so at this time cefazolin has been discontinued, allergy documented  and has been started on doxycycline.  Patient did have some exfoliation of the skin but no other issues and continues to feel better.  Doxycycline has been discontinued at this time after completion of antibiotic.Marland Kitchen  Physical therapy has seen the patient and recommended skilled nursing facility at this time.   Medically stable for discharge - awaiting dispo to facility. No acute issues or events overnight -please see discharge summary 02/12/2022 for full hospital course   Assessment & Plan:   Active Problems:   Diabetes mellitus without complication (HCC)   Chronic diastolic CHF (congestive heart failure) (HCC)    Anticoagulation goal of INR 2 to 3   Renal lesion   Rheumatoid arthritis (HCC)   Hypertension   Skin yeast infection  Sepsis secondary to left lower extremity cellulitis and UTI, POA: Has completed course of doxycycline  Status post IV Solu-Medrol and antihistamines for the skin rash and has improved at this time.   Diffuse erythematous rash, resolved being Patient is allergic to multiple antibiotic including vancomycin penicillin and will add cephalosporin as well.  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 Received IV Solu-Medrol and antihistamines.     Acute metabolic encephalopathy, resolved Likely multifactorial given above   Exophytic lesion on the anterior aspect of the mid upper right kidney CT abdomen patient with 4.8 x 3.4 x 4.1 centimeter exophytic lesion anterior aspect mid-upper right kidney.  Differential diagnosis was hemorrhagic cyst vs tumor.  Urinalysis showed RBC 11-20 per high-power field. 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 Size of 4.6 x 3.5 x 4.6 cm.  Spoke with urology Dr. Matilde Sprang, urology on 02/06/2022 who recommends following up with Dr. Gilford Rile or Dr. Tresa Moore, urology for further evaluation as outpatient.  We will need to set up an appointment prior to discharge.   History of recurrent DVT :  Continue Coumadin   Chronic diastolic CHF- w/ mild acute component with orthopnea  Extensive lower leg edema: EF 60 to 65% with no regional wall motion abnormality,  with grade 1 diastolic dysfunction.  CTA was limited but no evidence of pulmonary embolism but had some vascular congestion.  Resume home meds at discharge   Diabetes mellitus type II on long-term insulin, uncontrolled, with episodes of hypoglycemia and hyperglycemia See DC med list   Cutaneous  yeast infection  Continue nystatin powder   Rheumatoid arthritis:  Continue prednisone   Morbid obesity BMI 43- Patient will benefit from ongoing weight loss as outpatient.   Fibromyalgia, generalized body pain, rheumatoid arthritis.   Continue tramadol and oxycodone, prednisone ongoing   Deconditioning, debility, ambulatory dysfunction PT recommended skilled nursing facility placement.     Mild hypokalemia.  Resolved after replacement.   Mild AKI.  Latest creatinine of 0.6.  Resolved.     Constipation on MiraLAX and Colace.  As needed Dulcolax suppository as needed.  Subjective: No acute issues/events overnight  Objective: Vitals:   02/12/22 1600 02/12/22 2032 02/13/22 0535 02/13/22 0732  BP: 119/72 122/82 107/71 107/63  Pulse: (!) 56 65 68 73  Resp: '19 18 18 16  '$ Temp: 97.6 F (36.4 C) 97.6 F (36.4 C) 97.6 F (36.4 C) (!) 97.5 F (36.4 C)  TempSrc: Oral Oral Oral Oral  SpO2: 98% 100% 98% 97%  Weight:      Height:        Intake/Output Summary (Last 24 hours) at 02/13/2022 1448 Last data filed at 02/13/2022 1000 Gross per 24 hour  Intake 240 ml  Output 1050 ml  Net -810 ml    Filed Weights   02/01/22 1633  Weight: 123.4 kg    Examination:  General:  Pleasantly resting in bed, No acute distress. HEENT:  Normocephalic atraumatic.  Neck:  Without mass or deformity. Lungs:  Clear to auscultate bilaterally . Heart:  Regular rate and rhythm.  Abdomen:  Soft, nontender, nondistended.    Data Reviewed: I have personally reviewed following labs and imaging studies  CBC: Recent Labs  Lab 02/08/22 0550 02/09/22 0458 02/10/22 0229 02/11/22 0148 02/12/22 0118  WBC 30.5* 25.9* 17.5* 9.6 8.9  HGB 12.6 11.8* 12.0 11.7* 12.9  HCT 37.2 35.2* 36.2 35.0* 37.7  MCV 97.6 98.6 98.4 97.5 97.4  PLT 248 276 291 279 093    Basic Metabolic Panel: Recent Labs  Lab 02/08/22 0550 02/09/22 0458 02/10/22 0229 02/11/22 0148 02/12/22 0118  NA 135 134* 133* 136 138   K 4.0 4.9 5.2* 3.9 4.1  CL 104 102 102 103 104  CO2 21* '23 24 24 26  '$ GLUCOSE 81 212* 211* 216* 210*  BUN 16 22 28* 28* 25*  CREATININE 0.95 0.77 0.72 0.66 0.61  CALCIUM 7.9* 8.1* 7.9* 7.7* 8.0*  MG 1.8 2.3 2.3  --  2.2    GFR: Estimated Creatinine Clearance: 97.7 mL/min (by C-G formula based on SCr of 0.61 mg/dL). Liver Function Tests: No results for input(s): "AST", "ALT", "ALKPHOS", "BILITOT", "PROT", "ALBUMIN" in the last 168 hours. No results for input(s): "LIPASE", "AMYLASE" in the last 168 hours. No results for input(s): "AMMONIA" in the last 168 hours. Coagulation Profile: Recent Labs  Lab 02/09/22 0458 02/10/22 0229 02/11/22 0148 02/12/22 0118 02/13/22 0320  INR 2.0* 2.1* 2.4* 2.9* 3.2*    Cardiac Enzymes: No results for input(s): "CKTOTAL", "CKMB", "CKMBINDEX", "TROPONINI" in the last 168 hours. BNP (last 3 results) No results for input(s): "PROBNP" in the last 8760 hours. HbA1C: No results for input(s): "HGBA1C" in the last 72 hours. CBG: Recent Labs  Lab 02/12/22 1144 02/12/22 1555 02/12/22 2035 02/13/22  0729 02/13/22 1236  GLUCAP 110* 185* 133* 105* 142*    Lipid Profile: No results for input(s): "CHOL", "HDL", "LDLCALC", "TRIG", "CHOLHDL", "LDLDIRECT" in the last 72 hours. Thyroid Function Tests: No results for input(s): "TSH", "T4TOTAL", "FREET4", "T3FREE", "THYROIDAB" in the last 72 hours. Anemia Panel: No results for input(s): "VITAMINB12", "FOLATE", "FERRITIN", "TIBC", "IRON", "RETICCTPCT" in the last 72 hours. Sepsis Labs: No results for input(s): "PROCALCITON", "LATICACIDVEN" in the last 168 hours.  No results found for this or any previous visit (from the past 240 hour(s)).       Radiology Studies: No results found.      Scheduled Meds:  bisacodyl  10 mg Rectal Daily   docusate sodium  100 mg Oral BID   feeding supplement  237 mL Oral BID BM   folic acid  2 mg Oral Daily   furosemide  20 mg Intravenous Daily   gabapentin   300 mg Oral TID   gabapentin  400 mg Oral QHS   hydrocerin   Topical BID   insulin aspart  0-9 Units Subcutaneous TID WC   insulin glargine-yfgn  5 Units Subcutaneous QHS   multivitamin with minerals  1 tablet Oral Daily   nystatin   Topical BID   polyethylene glycol  17 g Oral Daily   predniSONE  5 mg Oral Q breakfast   topiramate  25 mg Oral BID   warfarin  1 mg Oral ONCE-1600   Warfarin - Pharmacist Dosing Inpatient   Does not apply q1600   Continuous Infusions:   LOS: 11 days   Time spent: 26mn  WLittle Ishikawa DO [facility]  If 7PM-7AM, please contact night-coverage [email or web address]  02/13/2022, 2:48 PM

## 2022-02-13 NOTE — TOC Transition Note (Signed)
Transition of Care [facility]) - CM/SW Discharge Note   Patient Details  Name: Shakeela Rabadan MRN: [id number] Date of Birth: December 06, 1960  Transition of [facility] CM/SW Contact:  Tresa Endo Phone Number: 02/13/2022, 1:50 PM   Clinical Narrative:    Patient will DC to: [facility] date: 02/13/2022 Family notified: Pt family Transport by: Sports coach    Per MD patient ready for DC to [facility] and rehab. RN to call report prior to discharge [phone]). RN, patient, patient's family, and facility notified of DC. Discharge Summary and FL2 sent to facility. DC packet on chart. Pt has personal transport to [place], [place] will sign off for now as social work intervention is no longer needed. Please consult [place] again if new needs arise.     Final next level of care: Skilled Nursing Facility Barriers to Discharge: Continued Medical Work up   Patient Goals and CMS Choice Patient states their goals for this hospitalization and ongoing recovery are:: Rehab CMS Medicare.gov Compare Post Acute Care list provided to:: Patient Choice offered to / list presented to : Patient  Discharge Placement                       Discharge Plan and Services   Discharge Planning Services: CM Consult Post Acute Care Choice: Home Health                               Social Determinants of Health (SDOH) Interventions     Readmission Risk Interventions     No data to display

## 2022-02-13 NOTE — Progress Notes (Deleted)
PROGRESS NOTE    Brittany Tran  AST:[id number] DOB: Sep 07, 1960 DOA: 02/01/2022 PCP: Curt Jews, PA-C   Brief Narrative:  61 years old female with past medical history of diabetes mellitus type 2, morbid obesity with BMI 43, chronic venous stasis ulceration worse on LLE, was found by neighbors slumped in her chair and minimally responsive after they did not hear from her in 12 hours. EMS was called and  transported to [facility] for evaluation with a concern for UTI and or leg infection and sepsis.In the ED, he was febrile with a temperature of 103.3 F and was tachycardic.  Patient was very confused and disoriented initially.  Patient was then admitted to the hospital for UTI and left lower extremity cellulitis.  Code sepsis was initiated initially.    During hospitalization, CT scan of the abdomen showed possible exophytic mass of the right kidney.  Patient continued to have leg pain and generalized body pain.  MRI of the abdomen showed large renal tumor.  Spoke with urology who recommends a follow-up with Dr. Gilford Rile and Dr. Tresa Moore as outpatient for further evaluation.  Patient also developed erythematous rash diffusely over her body after administration of vancomycin which was changed to cefazolin but still had more rash so at this time cefazolin has been discontinued, allergy documented  and has been started on doxycycline.  Patient did have some exfoliation of the skin but no other issues and continues to feel better.  Doxycycline has been discontinued at this time after completion of antibiotic.Marland Kitchen  Physical therapy has seen the patient and recommended skilled nursing facility at this time.   Medically stable for discharge - awaiting dispo to facility. No acute issues or events overnight -please see discharge summary 02/12/2022 for full hospital course   Assessment & Plan:   Active Problems:   Diabetes mellitus without complication (HCC)   Chronic diastolic CHF (congestive heart failure)  (HCC)   Anticoagulation goal of INR 2 to 3   Renal lesion   Rheumatoid arthritis (HCC)   Hypertension   Skin yeast infection  Sepsis secondary to left lower extremity cellulitis and UTI, POA: Has completed course of doxycycline  Status post IV Solu-Medrol and antihistamines for the skin rash and has improved at this time.   Diffuse erythematous rash, resolved being Patient is allergic to multiple antibiotic including vancomycin penicillin and will add cephalosporin as well.  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 Received IV Solu-Medrol and antihistamines.     Acute metabolic encephalopathy, resolved Likely multifactorial given above   Exophytic lesion on the anterior aspect of the mid upper right kidney CT abdomen patient with 4.8 x 3.4 x 4.1 centimeter exophytic lesion anterior aspect mid-upper right kidney.  Differential diagnosis was hemorrhagic cyst vs tumor.  Urinalysis showed RBC 11-20 per high-power field. 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 Size of 4.6 x 3.5 x 4.6 cm.  Spoke with urology Dr. Matilde Sprang, urology on 02/06/2022 who recommends following up with Dr. Gilford Rile or Dr. Tresa Moore, urology for further evaluation as outpatient.  We will need to set up an appointment prior to discharge.   History of recurrent DVT :  Continue Coumadin   Chronic diastolic CHF- w/ mild acute component with orthopnea  Extensive lower leg edema: EF 60 to 65% with no regional wall motion abnormality, with  grade 1 diastolic dysfunction.  CTA was limited but no evidence of pulmonary embolism but had some vascular congestion.  Resume home meds at discharge   Diabetes mellitus type II on long-term insulin, uncontrolled, with episodes of hypoglycemia and hyperglycemia See DC med list    Cutaneous yeast infection  Continue nystatin powder   Rheumatoid arthritis:  Continue prednisone   Morbid obesity BMI 43- Patient will benefit from ongoing weight loss as outpatient.   Fibromyalgia, generalized body pain, rheumatoid arthritis.   Continue tramadol and oxycodone, prednisone ongoing   Deconditioning, debility, ambulatory dysfunction PT recommended skilled nursing facility placement.     Mild hypokalemia.  Resolved after replacement.   Mild AKI.  Latest creatinine of 0.6.  Resolved.     Constipation on MiraLAX and Colace.  As needed Dulcolax suppository as needed.  Subjective: No acute issues/events overnight  Objective: Vitals:   02/12/22 1600 02/12/22 2032 02/13/22 0535 02/13/22 0732  BP: 119/72 122/82 107/71 107/63  Pulse: (!) 56 65 68 73  Resp: '19 18 18 16  '$ Temp: 97.6 F (36.4 C) 97.6 F (36.4 C) 97.6 F (36.4 C) (!) 97.5 F (36.4 C)  TempSrc: Oral Oral Oral Oral  SpO2: 98% 100% 98% 97%  Weight:      Height:        Intake/Output Summary (Last 24 hours) at 02/13/2022 0739 Last data filed at 02/13/2022 0500 Gross per 24 hour  Intake --  Output 1050 ml  Net -1050 ml   Filed Weights   02/01/22 1633  Weight: 123.4 kg    Examination:  General:  Pleasantly resting in bed, No acute distress. HEENT:  Normocephalic atraumatic.  Neck:  Without mass or deformity. Lungs:  Clear to auscultate bilaterally . Heart:  Regular rate and rhythm.  Abdomen:  Soft, nontender, nondistended.    Data Reviewed: I have personally reviewed following labs and imaging studies  CBC: Recent Labs  Lab 02/08/22 0550 02/09/22 0458 02/10/22 0229 02/11/22 0148 02/12/22 0118  WBC 30.5* 25.9* 17.5* 9.6 8.9  HGB 12.6 11.8* 12.0 11.7* 12.9  HCT 37.2 35.2* 36.2 35.0* 37.7  MCV 97.6 98.6 98.4 97.5 97.4  PLT 248 276 291 279 947   Basic Metabolic Panel: Recent Labs  Lab 02/08/22 0550 02/09/22 0458 02/10/22 0229 02/11/22 0148 02/12/22 0118  NA 135 134* 133* 136  138  K 4.0 4.9 5.2* 3.9 4.1  CL 104 102 102 103 104  CO2 21* '23 24 24 26  '$ GLUCOSE 81 212* 211* 216* 210*  BUN 16 22 28* 28* 25*  CREATININE 0.95 0.77 0.72 0.66 0.61  CALCIUM 7.9* 8.1* 7.9* 7.7* 8.0*  MG 1.8 2.3 2.3  --  2.2   GFR: Estimated Creatinine Clearance: 97.7 mL/min (by C-G formula based on SCr of 0.61 mg/dL). Liver Function Tests: No results for input(s): "AST", "ALT", "ALKPHOS", "BILITOT", "PROT", "ALBUMIN" in the last 168 hours. No results for input(s): "LIPASE", "AMYLASE" in the last 168 hours. No results for input(s): "AMMONIA" in the last 168 hours. Coagulation Profile: Recent Labs  Lab 02/09/22 0458 02/10/22 0229 02/11/22 0148 02/12/22 0118 02/13/22 0320  INR 2.0* 2.1* 2.4* 2.9* 3.2*   Cardiac Enzymes: No results for input(s): "CKTOTAL", "CKMB", "CKMBINDEX", "TROPONINI" in the last 168 hours. BNP (last 3 results) No results for input(s): "PROBNP" in the last 8760 hours. HbA1C: No results for input(s): "HGBA1C" in the last 72 hours. CBG: Recent Labs  Lab 02/12/22 0741 02/12/22 1144 02/12/22 1555 02/12/22 2035 02/13/22 0729  GLUCAP 82  110* 185* 133* 105*   Lipid Profile: No results for input(s): "CHOL", "HDL", "LDLCALC", "TRIG", "CHOLHDL", "LDLDIRECT" in the last 72 hours. Thyroid Function Tests: No results for input(s): "TSH", "T4TOTAL", "FREET4", "T3FREE", "THYROIDAB" in the last 72 hours. Anemia Panel: No results for input(s): "VITAMINB12", "FOLATE", "FERRITIN", "TIBC", "IRON", "RETICCTPCT" in the last 72 hours. Sepsis Labs: No results for input(s): "PROCALCITON", "LATICACIDVEN" in the last 168 hours.  No results found for this or any previous visit (from the past 240 hour(s)).       Radiology Studies: No results found.      Scheduled Meds:  bisacodyl  10 mg Rectal Daily   docusate sodium  100 mg Oral BID   feeding supplement  237 mL Oral BID BM   folic acid  2 mg Oral Daily   furosemide  20 mg Intravenous Daily   gabapentin  300  mg Oral TID   gabapentin  400 mg Oral QHS   hydrocerin   Topical BID   insulin aspart  0-9 Units Subcutaneous TID WC   insulin glargine-yfgn  5 Units Subcutaneous QHS   multivitamin with minerals  1 tablet Oral Daily   nystatin   Topical BID   polyethylene glycol  17 g Oral Daily   predniSONE  5 mg Oral Q breakfast   topiramate  25 mg Oral BID   Warfarin - Pharmacist Dosing Inpatient   Does not apply q1600   Continuous Infusions:   LOS: 11 days   Time spent: 73mn  WLittle Ishikawa DO [facility]  If 7PM-7AM, please contact night-coverage [email or web address]  02/13/2022, 7:39 AM

## 2022-02-13 NOTE — Progress Notes (Signed)
AVS printed, report called and given to Jarrett Soho, Therapist, sports at receiving [facility]. All questions/concerns addressed. Discharge reviewed with patient and son at bedside. All question.concerns addressed. Son to transport patient to receiving facility

## 2022-02-13 NOTE — Progress Notes (Signed)
OT Cancellation Note  Patient Details Name: Ashleyanne Hemmingway MRN: [id number] DOB: July 15, 1960   Cancelled Treatment:    Reason Eval/Treat Not Completed: Patient declined, no reason specified Pt declined OOB activities with OT due to someone waking her up at [facility] today. Pt also reported pain. Will follow up in coordination with pain premedication as schedule permits.   Layla Maw 02/13/2022, 8:07 AM

## 2022-03-14 ENCOUNTER — Emergency Department (HOSPITAL_COMMUNITY)
Admission: EM | Admit: 2022-03-14 | Discharge: 2022-03-15 | Disposition: A | Discharge status: Home / Self Care | Payer: BC Managed Care – PPO | Attending: Emergency Medicine | Admitting: Emergency Medicine

## 2022-03-14 ENCOUNTER — Other Ambulatory Visit: Payer: Self-pay

## 2022-03-14 ENCOUNTER — Encounter (HOSPITAL_COMMUNITY): Payer: Self-pay

## 2022-03-14 DIAGNOSIS — Z7901 Long term (current) use of anticoagulants: Secondary | ICD-10-CM | POA: Insufficient documentation

## 2022-03-14 DIAGNOSIS — I82411 Acute embolism and thrombosis of right femoral vein: Secondary | ICD-10-CM | POA: Insufficient documentation

## 2022-03-14 DIAGNOSIS — M7989 Other specified soft tissue disorders: Secondary | ICD-10-CM | POA: Diagnosis present

## 2022-03-14 LAB — CBC WITH DIFFERENTIAL/PLATELET
Abs Immature Granulocytes: 0.01 10*3/uL (ref 0.00–0.07)
Basophils Absolute: 0 10*3/uL (ref 0.0–0.1)
Basophils Relative: 0 %
Eosinophils Absolute: 0.4 10*3/uL (ref 0.0–0.5)
Eosinophils Relative: 7 %
HCT: 37.7 % (ref 36.0–46.0)
Hemoglobin: 11.8 g/dL — ABNORMAL LOW (ref 12.0–15.0)
Immature Granulocytes: 0 %
Lymphocytes Relative: 35 %
Lymphs Abs: 2.2 10*3/uL (ref 0.7–4.0)
MCH: 31.5 pg (ref 26.0–34.0)
MCHC: 31.3 g/dL (ref 30.0–36.0)
MCV: 100.5 fL — ABNORMAL HIGH (ref 80.0–100.0)
Monocytes Absolute: 0.5 10*3/uL (ref 0.1–1.0)
Monocytes Relative: 7 %
Neutro Abs: 3 10*3/uL (ref 1.7–7.7)
Neutrophils Relative %: 51 %
Platelets: 154 10*3/uL (ref 150–400)
RBC: 3.75 MIL/uL — ABNORMAL LOW (ref 3.87–5.11)
RDW: 15 % (ref 11.5–15.5)
WBC: 6.1 10*3/uL (ref 4.0–10.5)
nRBC: 0 % (ref 0.0–0.2)

## 2022-03-14 LAB — BASIC METABOLIC PANEL
Anion gap: 7 (ref 5–15)
BUN: 10 mg/dL (ref 8–23)
CO2: 30 mmol/L (ref 22–32)
Calcium: 9.1 mg/dL (ref 8.9–10.3)
Chloride: 107 mmol/L (ref 98–111)
Creatinine, Ser: 0.91 mg/dL (ref 0.44–1.00)
GFR, Estimated: >60 mL/min (ref >60)
Glucose, Bld: 150 mg/dL — ABNORMAL HIGH (ref 70–99)
Potassium: 3.3 mmol/L — ABNORMAL LOW (ref 3.5–5.1)
Sodium: 144 mmol/L (ref 135–145)

## 2022-03-14 LAB — PROTIME-INR
INR: 1.2 (ref 0.8–1.2)
Prothrombin Time: 15 seconds (ref 11.4–15.2)

## 2022-03-14 NOTE — ED Triage Notes (Signed)
Was at [facility] and dx with 2 blood clots in the right leg and prefers [facility] so came here for treatment.

## 2022-03-14 NOTE — ED Provider Triage Note (Signed)
Emergency Medicine Provider Triage Evaluation Note  Brittany Tran , a 61 y.o. female  was evaluated in triage.  Pt complains of right DVT.  Recently out of nursing home and now living with parents. Noticed right leg swelling on Saturday and now pain in her right groin. She was seen at [facility] and had a DVT study this morning that was positive for acute DVT in common right femoral, femoral, popliteal vein. She does have history of blood clots in left leg, but never the right. She has been on Warfarin for a while. She said that she was getting underdosed while in nursing home and sometimes they did not give her the medication.   She denies chest pain, shortness of breath, syncope, or dizziness.  Review of Systems  Positive:  Negative:   Physical Exam  BP 121/73 (BP Location: Left Arm)   Pulse 95   Temp 98.8 F (37.1 C) (Oral)   Resp 18   SpO2 100%  Gen:   Awake, no distress   Resp:  Normal effort  MSK:   Moves extremities without difficulty  Other:  Right LE swelling and redness. Pulses intact. Ext warm to the touch.   Medical Decision Making  Medically screening exam initiated at 3:57 PM.  Appropriate orders placed.  Lachae Hohler was informed that the remainder of the evaluation will be completed by another provider, this initial triage assessment does not replace that evaluation, and the importance of remaining in the ED until their evaluation is complete.     Adolphus Birchwood, [place] 03/14/22 1558

## 2022-03-15 ENCOUNTER — Other Ambulatory Visit (HOSPITAL_COMMUNITY): Payer: Self-pay

## 2022-03-15 ENCOUNTER — Emergency Department (HOSPITAL_COMMUNITY): Payer: BC Managed Care – PPO

## 2022-03-15 MED ORDER — ENOXAPARIN SODIUM 120 MG/0.8ML IJ SOSY
120.0000 mg | PREFILLED_SYRINGE | Freq: Once | INTRAMUSCULAR | Status: AC
  Administered 2022-03-15: 120 mg via SUBCUTANEOUS
  Filled 2022-03-15: qty 0.8

## 2022-03-15 MED ORDER — IOHEXOL 350 MG/ML SOLN
75.0000 mL | Freq: Once | INTRAVENOUS | Status: AC | PRN
  Administered 2022-03-15: 75 mL via INTRAVENOUS

## 2022-03-15 MED ORDER — PREDNISONE 1 MG PO TABS
2.0000 mg | ORAL_TABLET | Freq: Once | ORAL | Status: AC
  Administered 2022-03-15: 2 mg via ORAL
  Filled 2022-03-15: qty 2

## 2022-03-15 MED ORDER — ENOXAPARIN SODIUM 120 MG/0.8ML IJ SOSY
1.0000 mg/kg | PREFILLED_SYRINGE | Freq: Two times a day (BID) | INTRAMUSCULAR | 0 refills | Status: DC
  Filled 2022-03-15: qty 11.2, 7d supply, fill #0

## 2022-03-15 MED ORDER — GABAPENTIN 300 MG PO CAPS
300.0000 mg | ORAL_CAPSULE | Freq: Once | ORAL | Status: AC
  Administered 2022-03-15: 300 mg via ORAL
  Filled 2022-03-15: qty 1

## 2022-03-15 MED ORDER — TOPIRAMATE 25 MG PO TABS
25.0000 mg | ORAL_TABLET | Freq: Once | ORAL | Status: AC
  Administered 2022-03-15: 25 mg via ORAL
  Filled 2022-03-15: qty 1

## 2022-03-15 NOTE — Discharge Instructions (Signed)
Follow-up with your family doctor in the office.  Please return for chest pain difficulty breathing or if you are coughing up bladder passing out.-

## 2022-03-15 NOTE — ED Provider Notes (Signed)
[facility] EMERGENCY DEPARTMENT Provider Note   CSN: [id number] Arrival date & time: 03/14/22  1517     History  Chief Complaint  Patient presents with   Leg Swelling    Shali Vesey is a 61 y.o. female.  61 yo F with a chief complaint of bilateral leg pain and swelling.  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  She recently had her dose decreased when she was in a skilled nursing facility.  She was seen by her PCP for a chronic wound on the left lower extremity.  DVT studies were ordered.  She was found to have a DVT was told she needed admission and she did not want to go into the [facility] hospital and so came here to be evaluated.  She denies any chest pain or difficulty breathing denies feel like she might pass out.        Home Medications Prior to Admission medications   Medication Sig Start Date End Date Taking? Authorizing Provider  enoxaparin (LOVENOX) 120 MG/0.8ML injection Inject 0.8 mLs (120 mg total) into the skin every 12 (twelve) hours for 7 days. 03/15/22 03/22/22 Yes Deno Etienne, DO  acetaminophen (TYLENOL) 325 MG tablet Take 2 tablets (650 mg total) by mouth every 6 (six) hours as needed for mild pain or fever. 05/21/18   Roxan Hockey, MD  docusate sodium (COLACE) 100 MG capsule Take 1 capsule (100 mg total) by mouth 2 (two) times daily. Hold for loose bowel movements 02/12/22   Pokhrel, Corrie Mckusick, MD  feeding supplement (ENSURE ENLIVE / ENSURE PLUS) LIQD Take 237 mLs by mouth 2 (two) times daily between meals. 02/12/22   Pokhrel, Corrie Mckusick, MD  folic acid (FOLVITE) 1 MG tablet Take 2 tablets (2 mg total) by mouth daily. 02/12/22   Pokhrel, Corrie Mckusick, MD  furosemide (LASIX) 20 MG tablet Take 1 tablet (20 mg total) by mouth daily. 02/14/22 02/14/23  Pokhrel, Corrie Mckusick, MD  gabapentin (NEURONTIN) 300 MG capsule Take 1 capsule (300 mg total) by mouth 3 (three) times daily. 02/12/22   Pokhrel, Corrie Mckusick, MD  glucose blood (TRUE METRIX BLOOD GLUCOSE TEST)  test strip Use as instructed 06/22/18   Elsie Stain, MD  hydrocerin (EUCERIN) CREA Apply 1 Application topically 2 (two) times daily. To dry skin and areas of scaling 02/12/22   Pokhrel, Corrie Mckusick, MD  methotrexate (RHEUMATREX) 2.5 MG tablet Take 25 mg by mouth once a week.  10/28/19   [provider]  Multiple Vitamin (MULTIVITAMIN WITH MINERALS) TABS tablet Take 1 tablet by mouth daily. 02/13/22   Pokhrel, Corrie Mckusick, MD  nystatin (MYCOSTATIN/NYSTOP) powder Apply topically 2 (two) times daily. Apply to intertriginous area under panniculus 02/12/22   Pokhrel, Laxman, MD  polyethylene glycol (MIRALAX / GLYCOLAX) 17 g packet Take 17 g by mouth daily as needed for moderate constipation or mild constipation. 02/12/22   Pokhrel, Corrie Mckusick, MD  predniSONE (DELTASONE) 1 MG tablet Take 10 tablets (10 mg total) by mouth daily with breakfast for 3 days, THEN 5 tablets (5 mg total) daily with breakfast for 3 days, THEN 1 tablet (1 mg total) daily with breakfast. 02/12/22 02/18/23  Pokhrel, Corrie Mckusick, MD  topiramate (TOPAMAX) 25 MG tablet Take 1 tablet (25 mg total) by mouth 2 (two) times daily. 12/04/21   Raulkar, Clide Deutscher, MD  TRUEPLUS LANCETS 28G MISC Use to check blood sugar daily. 06/22/18   Elsie Stain, MD  Vitamin D, Ergocalciferol, (DRISDOL) 1.25 MG (50000 UNIT) CAPS capsule  Take 1 capsule (50,000 Units total) by mouth every 7 (seven) days. 12/26/21   Raulkar, Clide Deutscher, MD  warfarin (COUMADIN) 5 MG tablet Take 2.5-5 mg by mouth See admin instructions. Take 2.5 mg on Sunday, Tuesday, and Thursday. Then take 5 mg on Monday, Wednesday, Friday, and Saturday. Or as directed by Coumadin clinic. 09/19/21   [provider]      Allergies    Penicillins, Vancomycin, Cefazolin, Penicillin g benzathine, and Sulfa antibiotics    Review of Systems   Review of Systems  Physical Exam Updated Vital Signs BP 129/68   Pulse 78   Temp (!) 97.5 F (36.4 C) (Oral)   Resp 18   Ht 5' 6"  (1.676 m)   Wt 123.4 kg    SpO2 98%   BMI 43.90 kg/m  Physical Exam Vitals and nursing note reviewed.  Constitutional:      General: She is not in acute distress.    Appearance: She is well-developed. She is not diaphoretic.  HENT:     Head: Normocephalic and atraumatic.  Eyes:     Pupils: Pupils are equal, round, and reactive to light.  Cardiovascular:     Rate and Rhythm: Normal rate and regular rhythm.     Heart sounds: No murmur heard.    No friction rub. No gallop.  Pulmonary:     Effort: Pulmonary effort is normal.     Breath sounds: No wheezing or rales.  Abdominal:     General: There is no distension.     Palpations: Abdomen is soft.     Tenderness: There is no abdominal tenderness.  Musculoskeletal:        General: No tenderness.     Cervical back: Normal range of motion and neck supple.     Comments: Bilateral lower extremity edema worse on the left than the right.  Chronic appearing wound to the left lower leg.  No obvious fluctuance or induration.  Intact pulse motor and sensation bilaterally.  Skin:    General: Skin is warm and dry.  Neurological:     Mental Status: She is alert and oriented to person, place, and time.  Psychiatric:        Behavior: Behavior normal.     ED Results / Procedures / Treatments   Labs (all labs ordered are listed, but only abnormal results are displayed) Labs Reviewed  BASIC METABOLIC PANEL - Abnormal; Notable for the following components:      Result Value   Potassium 3.3 (*)    Glucose, Bld 150 (*)    All other components within normal limits  CBC WITH DIFFERENTIAL/PLATELET - Abnormal; Notable for the following components:   RBC 3.75 (*)    Hemoglobin 11.8 (*)    MCV 100.5 (*)    All other components within normal limits  PROTIME-INR    EKG None  Radiology CT VENOGRAM ABD/PEL  Result Date: 03/15/2022 CLINICAL DATA:  61 year old female with history of deep vein thrombosis. EXAM: CT VENOGRAM ABD-PELVIS TECHNIQUE: Multidetector CT imaging of  the abdomen and pelvis was performed using the standard protocol after bolus administration of intravenous contrast. Delayed imaging at 90 seconds was also obtained. multiplanar reconstructed images and MIPs were obtained and reviewed to evaluate the vascular anatomy. CONTRAST:  54m OMNIPAQUE IOHEXOL 350 MG/ML SOLN COMPARISON:  1823, 02/05/2022 FINDINGS: VASCULAR IVC: Normal caliber and patent.  No variant anatomy. Renal veins: Patent bilaterally. Iliac veins: The bilateral common, external, and internal iliac veins are patent. Femoral veins:  Nonocclusive right femoral deep vein thrombosis extending into the common femoral vein to the level of the saphenous confluence. No evidence of iliac extension. Patent visualized left femoral vein. Portal system: Normal caliber and patent main and intrahepatic portal veins. The superior mesenteric and splenic veins are patent. Arterial system: Normal caliber abdominal aorta. Review of the MIP images confirms the above findings. NON-VASCULAR Lower chest: No acute abnormality. Hepatobiliary: Decreased attenuation of the hepatic parenchyma relative to the spleen. No focal lesions. The gallbladder is present with multifocal peripherally calcified gallstones. No gallbladder wall thickening or pericholecystic fluid. No intra or extrahepatic biliary ductal dilation. Pancreas: Fatty atrophy.  No ductal dilation.  No focal masses. Spleen: Normal in size without focal abnormality. Adrenals/Urinary Tract: Adrenal glands are unremarkable. Similar appearing enhancing exophytic mass about the right upper pole anteriorly measuring approximately 5.0 cm. Similar appearing large right interpolar simple renal cyst measuring up to 13.4 cm in maximum axial dimension. Unchanged exophytic left interpolar 2.3 cm hemorrhagic cyst. Similar appearing left hilar 2.0 cm simple cortical cyst. Multifocal bilateral nonobstructive nephrolithiasis. Bladder is unremarkable. Stomach/Bowel: Stomach is within  normal limits. Appendix appears normal. No evidence of bowel wall thickening, distention, or inflammatory changes. Lymphatic: No abdominopelvic lymphadenopathy. Reproductive: Uterus and bilateral adnexa are unremarkable. Other: No abdominal wall hernia or abnormality. No abdominopelvic ascites. Musculoskeletal: No acute or significant osseous findings. IMPRESSION: VASCULAR Partially visualized right femoral deep vein thrombosis extending to the level of the right common femoral vein. No evidence of iliocaval extension. NON-VASCULAR 1. No acute abdominopelvic abnormality. 2. Similar appearing right upper pole exophytic enhancing renal mass measuring up to approximately 5 cm, characterized as renal cell carcinoma on recent comparison MRI abdomen. 3. Similar appearing multifocal renal cysts, the largest about the right interpolar region measuring up to 13 cm. 4. Hepatic steatosis. 5. Multifocal bilateral nonobstructive nephrolithiasis. Ruthann Cancer, MD [facility] Radiology Electronically Signed   By: Ruthann Cancer M.D.   On: 03/15/2022 15:43    Procedures Procedures    Medications Ordered in ED Medications  gabapentin (NEURONTIN) capsule 300 mg (300 mg Oral Given 03/15/22 1157)  topiramate (TOPAMAX) tablet 25 mg (25 mg Oral Given 03/15/22 1157)  enoxaparin (LOVENOX) injection 120 mg (120 mg Subcutaneous Given 03/15/22 1533)  predniSONE (DELTASONE) tablet 2 mg (2 mg Oral Given 03/15/22 1533)  iohexol (OMNIPAQUE) 350 MG/ML injection 75 mL (75 mLs Intravenous Contrast Given 03/15/22 1527)    ED Course/ Medical Decision Making/ A&P                           Medical Decision Making Amount and/or Complexity of Data Reviewed Radiology: ordered.  Risk Prescription drug management.   61 yo F with a chief complaint of a DVT.  Patient was being followed by her PCP for a chronic wound to the left lower extremity.  She had DVT studies done that showed a fairly  extensive right sided DVT.  Her INR is subtherapeutic.  Will discuss with vascular.  I discussed case with Dr. Doren Custard, recommended imaging of the abdomen pelvis to assess for iliac vein involvement if negative would be safe discharging home on anticoagulation.  Positive would admit to medicine.  CT scan with no iliac involvement.  Discussed results with the patient.  I reviewed her last admission and she was noted to have findings concerning for renal cell carcinoma.  Discussed with her about this.  Have her follow-up with urology.  I  discussed case with the clinical pharmacist who recommended Lovenox for 7 days 1 mg/kg twice daily.  Increase her Coumadin to 5 mg a day.  4:09 PM:  I have discussed the diagnosis/risks/treatment options with the patient and family.  Evaluation and diagnostic testing in the emergency department does not suggest an emergent condition requiring admission or immediate intervention beyond what has been performed at this time.  They will follow up with  PCP, urology. We also discussed returning to the ED immediately if new or worsening sx occur. We discussed the sx which are most concerning (e.g., sudden worsening pain, fever, inability to tolerate by mouth) that necessitate immediate return. Medications administered to the patient during their visit and any new prescriptions provided to the patient are listed below.  Medications given during this visit Medications  gabapentin (NEURONTIN) capsule 300 mg (300 mg Oral Given 03/15/22 1157)  topiramate (TOPAMAX) tablet 25 mg (25 mg Oral Given 03/15/22 1157)  enoxaparin (LOVENOX) injection 120 mg (120 mg Subcutaneous Given 03/15/22 1533)  predniSONE (DELTASONE) tablet 2 mg (2 mg Oral Given 03/15/22 1533)  iohexol (OMNIPAQUE) 350 MG/ML injection 75 mL (75 mLs Intravenous Contrast Given 03/15/22 1527)     The patient appears reasonably screen and/or stabilized for discharge and I doubt any other medical condition or other [facility]  requiring further screening, evaluation, or treatment in the ED at this time prior to discharge.           Final Clinical Impression(s) / ED Diagnoses Final diagnoses:  Acute deep vein thrombosis (DVT) of femoral vein of right lower extremity ([place])    Rx / DC Orders ED Discharge Orders          Ordered    enoxaparin (LOVENOX) 120 MG/0.8ML injection  Every 12 hours       Note to Pharmacy: ok to change to 125m/0.8ml syringes #14 per MD 03/15/22 maf   03/15/22 1Wilton DLower [place] DO 03/15/22 1610

## 2022-03-25 ENCOUNTER — Ambulatory Visit: Payer: BC Managed Care – PPO | Admitting: Physical Medicine and Rehabilitation

## 2022-04-10 ENCOUNTER — Encounter: Payer: BC Managed Care – PPO | Attending: Registered Nurse | Admitting: Registered Nurse

## 2022-04-10 VITALS — BP 132/82 | HR 76 | Ht 66.0 in | Wt 267.6 lb

## 2022-04-10 DIAGNOSIS — Z79891 Long term (current) use of opiate analgesic: Secondary | ICD-10-CM | POA: Diagnosis present

## 2022-04-10 DIAGNOSIS — G894 Chronic pain syndrome: Secondary | ICD-10-CM | POA: Diagnosis not present

## 2022-04-10 DIAGNOSIS — M5416 Radiculopathy, lumbar region: Secondary | ICD-10-CM | POA: Diagnosis not present

## 2022-04-10 DIAGNOSIS — Z5181 Encounter for therapeutic drug level monitoring: Secondary | ICD-10-CM | POA: Insufficient documentation

## 2022-04-10 DIAGNOSIS — M797 Fibromyalgia: Secondary | ICD-10-CM | POA: Insufficient documentation

## 2022-04-10 NOTE — Progress Notes (Signed)
Subjective:    Patient ID: Brittany Tran, female    DOB: [birth year], 61 y.o.   MRN: [id number]  HPI: Brittany Tran is a 61 y.o. female who returns for follow up appointment for chronic pain and medication refill. She states her pain is located in her lower back radiating into her right hip and right lower extremity. She rates her pain 8. Her current exercise regime is walking.  Brittany Tran was admitted to [facility] on 02/01/2022- and discharged to Rehabilitation on 02/13/2022. She was discharged for Rehab on 03/08/2022.  She was admitted for Sepsis , discharged summary was reviewed.   Last office visit, 01/09/2022, Oral swab was ordered, Narcotic Policy was reviewed. This was also discussed with Dr Ranell Patrick, Brittany Tran verbalizes understanding.     Pain Inventory Average Pain 8 Pain Right Now 8 My pain is aching  In the last 24 hours, has pain interfered with the following? General activity 5 Relation with others 5 Enjoyment of life 5 What TIME of day is your pain at its worst? varies Sleep (in general) Poor  Pain is worse with: walking, sitting, and standing Pain improves with: rest and medication Relief from Meds: 9  Family History  Problem Relation Age of Onset  . Diabetes Mother    Social History   Socioeconomic History  . Marital status: Single    Spouse name: Not on file  . Number of children: Not on file  . Years of education: Not on file  . Highest education level: Not on file  Occupational History  . Occupation: unemployed  Tobacco Use  . Smoking status: Never  . Smokeless tobacco: Never  Vaping Use  . Vaping Use: Never used  Substance and Sexual Activity  . Alcohol use: No  . Drug use: No  . Sexual activity: Not on file  Other Topics Concern  . Not on file  Social History Narrative  . Not on file   Social Determinants of Health   Financial Resource Strain: Not on file  Food Insecurity: Not on file  Transportation Needs: Not on file   Physical Activity: Not on file  Stress: Not on file  Social Connections: Not on file   Past Surgical History:  Procedure Laterality Date  . HARDWARE REMOVAL Left 09/29/2014   Procedure: REMOVAL OF DEEP IMPLANT OF LEFT FIBULA  AND TIBIA (SEPARATE INCISIONS);  Surgeon: Wylene Simmer, MD;  Location: [place];  Service: Orthopedics;  Laterality: Left;  . ORIF ANKLE FRACTURE  1988   left   Past Surgical History:  Procedure Laterality Date  . HARDWARE REMOVAL Left 09/29/2014   Procedure: REMOVAL OF DEEP IMPLANT OF LEFT FIBULA  AND TIBIA (SEPARATE INCISIONS);  Surgeon: Wylene Simmer, MD;  Location: [place];  Service: Orthopedics;  Laterality: Left;  . ORIF ANKLE FRACTURE  1988   left   Past Medical History:  Diagnosis Date  . Cellulitis   . Diabetes mellitus without complication ([place])   . Diastolic heart failure ([place])    Echo 03/14/15 Grade I DD  . Fibromyalgia   . History of DVT (deep vein thrombosis)   . Hypertension   . Rheumatoid arthritis ([place])   . Sepsis ([place]) 12/2019   BP 132/82   Pulse 76   Ht '5\' 6"'$  (1.676 m)   Wt 267 lb 9.6 oz (121.4 kg)   SpO2 100%   BMI 43.19 kg/m   Opioid Risk Score:   Fall Risk Score:  `1  Depression screen [facility] 2/9     04/10/2022   11:54 AM 01/09/2022    1:41 PM 12/04/2021    9:45 AM 06/22/2018   11:54 AM  Depression screen PHQ 2/9  Decreased Interest 0 1 1 0  Down, Depressed, Hopeless 0 1 1 0  PHQ - 2 Score 0 2 2 0  Altered sleeping   1 3  Tired, decreased energy   1 0  Change in appetite   1 0  Feeling bad or failure about yourself    1 0  Trouble concentrating   1 0  Moving slowly or fidgety/restless   0 0  Suicidal thoughts   0 0  PHQ-9 Score   7 3     Review of Systems  Musculoskeletal:        Left lower leg pain Bilateral upper arm pain Buttocks pain  All other systems reviewed and are negative.     Objective:   Physical Exam Vitals and nursing note reviewed.  Constitutional:       Appearance: Normal appearance.  Cardiovascular:     Rate and Rhythm: Normal rate and regular rhythm.     Pulses: Normal pulses.     Heart sounds: Normal heart sounds.  Pulmonary:     Effort: Pulmonary effort is normal.     Breath sounds: Normal breath sounds.  Musculoskeletal:     Cervical back: Normal range of motion and neck supple.     Comments: Normal Muscle Bulk and Muscle Testing Reveals:  Upper Extremities: Right: Decreased ROM 90 Degrees and Muscle Strength 5/5 Left Upper Extremity: Full ROM and Muscle Strength 5/5 Lumbar Paraspinal Tenderness: L-4-L-5 Lower Extremities: Decreased ROM and Muscle Strength 5/5 Right Lower Extremity Flexion Produces Pain into her Right Hip Arises from chair slowly using cane for support Antalgic  Gait     Skin:    General: Skin is warm and dry.  Neurological:     Mental Status: She is alert and oriented to person, place, and time.  Psychiatric:        Mood and Affect: Mood normal.        Behavior: Behavior normal.         Assessment & Plan:  Right Lumbar Radiculitis: Continue HEP as tolerated. Continue Gabapentin. Continue to Monitor. 04/10/2022 Fibromyalgia: Continue HEP as tolerated. Continue Gabapentin. Continue to Monitor. 04/10/2022 Rheumatoid Arthritis: Rheumatology Following.  Chronic Pain Syndrome: Oral Swab ordered today, awaiting results. If consistent Oxycodone will be prescribed.  5. Left Leg Cellulitis: Wound Care Following , patient reports. Continue to monitor.    F/U in 1 month

## 2022-04-12 ENCOUNTER — Encounter: Payer: Self-pay | Admitting: Registered Nurse

## 2022-04-13 LAB — DRUG TOX MONITOR 1 W/CONF, ORAL FLD

## 2022-04-13 LAB — DRUG TOX ALC METAB W/CON, ORAL FLD: Alcohol Metabolite: NEGATIVE ng/mL (ref <25)

## 2022-04-16 ENCOUNTER — Telehealth: Payer: Self-pay | Admitting: Registered Nurse

## 2022-04-16 MED ORDER — OXYCODONE HCL 5 MG PO TABS: 5.0000 mg | ORAL_TABLET | Freq: Every day | ORAL | 0 refills | Status: DC | PRN

## 2022-04-16 NOTE — Telephone Encounter (Signed)
UDS  Reviewed Oxycodone e-scribed today.  Call placed to Ms. Bang, she verbalizes understanding.

## 2022-04-16 NOTE — Addendum Note (Signed)
Addended by: Bayard Hugger on: 04/16/2022 04:57 PM   Modules accepted: Orders

## 2022-04-16 NOTE — Telephone Encounter (Signed)
Placed a call to Brittany Tran, no answer. Left message to return the call. UDS results were reviewed.

## 2022-04-16 NOTE — Telephone Encounter (Signed)
Brittany Tran called you back. Please call her on [phone].

## 2022-04-18 ENCOUNTER — Telehealth: Payer: Self-pay

## 2022-04-18 NOTE — Telephone Encounter (Signed)
Oxycodone approved 04/17/22-05/16/22

## 2022-04-30 ENCOUNTER — Encounter: Payer: Self-pay | Admitting: Registered Nurse

## 2022-04-30 ENCOUNTER — Encounter: Payer: BC Managed Care – PPO | Attending: Registered Nurse | Admitting: Registered Nurse

## 2022-04-30 VITALS — BP 125/83 | HR 99 | Ht 66.0 in | Wt 263.2 lb

## 2022-04-30 DIAGNOSIS — Z5181 Encounter for therapeutic drug level monitoring: Secondary | ICD-10-CM

## 2022-04-30 DIAGNOSIS — M62838 Other muscle spasm: Secondary | ICD-10-CM

## 2022-04-30 DIAGNOSIS — G894 Chronic pain syndrome: Secondary | ICD-10-CM

## 2022-04-30 DIAGNOSIS — Z79891 Long term (current) use of opiate analgesic: Secondary | ICD-10-CM

## 2022-04-30 DIAGNOSIS — M797 Fibromyalgia: Secondary | ICD-10-CM | POA: Diagnosis present

## 2022-04-30 MED ORDER — OXYCODONE HCL 5 MG PO TABS: 5.0000 mg | ORAL_TABLET | Freq: Every day | ORAL | 0 refills | Status: DC | PRN

## 2022-04-30 NOTE — Progress Notes (Unsigned)
Subjective:    Patient ID: Brittany Tran, female    DOB: 06-22-1960, 60 y.o.   MRN: [id number]  HPI: Brittany Tran is a 61 y.o. female who returns for follow up appointment for chronic pain and medication refill. states *** pain is located in  ***. rates pain ***. current exercise regime is walking and performing stretching exercises.  [name] Morphine equivalent is *** MME.      Pain Inventory Average Pain 5 Pain Right Now 8 My pain is constant and sharp  In the last 24 hours, has pain interfered with the following? General activity 0 Relation with others 0 Enjoyment of life 0 What TIME of day is your pain at its worst? varies Sleep (in general) Poor  Pain is worse with: bending, standing, and some activites Pain improves with: heat/ice and medication Relief from Meds: 7  Family History  Problem Relation Age of Onset   Diabetes Mother    Social History   Socioeconomic History   Marital status: Single    Spouse name: Not on file   Number of children: Not on file   Years of education: Not on file   Highest education level: Not on file  Occupational History   Occupation: unemployed  Tobacco Use   Smoking status: Never   Smokeless tobacco: Never  Vaping Use   Vaping Use: Never used  Substance and Sexual Activity   Alcohol use: No   Drug use: No   Sexual activity: Not on file  Other Topics Concern   Not on file  Social History Narrative   Not on file   Social Determinants of Health   Financial Resource Strain: Not on file  Food Insecurity: Not on file  Transportation Needs: Not on file  Physical Activity: Not on file  Stress: Not on file  Social Connections: Not on file   Past Surgical History:  Procedure Laterality Date   HARDWARE REMOVAL Left 09/29/2014   Procedure: REMOVAL OF DEEP IMPLANT OF LEFT FIBULA  AND TIBIA (SEPARATE INCISIONS);  Surgeon: Wylene Simmer, MD;  Location: [place];  Service: Orthopedics;  Laterality: Left;    ORIF ANKLE FRACTURE  1988   left   Past Surgical History:  Procedure Laterality Date   HARDWARE REMOVAL Left 09/29/2014   Procedure: REMOVAL OF DEEP IMPLANT OF LEFT FIBULA  AND TIBIA (SEPARATE INCISIONS);  Surgeon: Wylene Simmer, MD;  Location: [place];  Service: Orthopedics;  Laterality: Left;   ORIF ANKLE FRACTURE  1988   left   Past Medical History:  Diagnosis Date   Cellulitis    Diabetes mellitus without complication ([facility])    Diastolic heart failure ([facility])    Echo 03/14/15 Grade I DD   Fibromyalgia    History of DVT (deep vein thrombosis)    Hypertension    Rheumatoid arthritis ([place])    Sepsis ([place]) 12/2019   BP 125/83   Pulse 99   Ht '5\' 6"'$  (1.676 m)   Wt 263 lb 3.2 oz (119.4 kg)   SpO2 98%   BMI 42.48 kg/m   Opioid Risk Score:   Fall Risk Score:  `1  Depression screen [facility] 2/9     04/10/2022   11:54 AM 01/09/2022    1:41 PM 12/04/2021    9:45 AM 06/22/2018   11:54 AM  Depression screen PHQ 2/9  Decreased Interest 0 1 1 0  Down, Depressed, Hopeless 0 1 1 0  PHQ - 2 Score 0  2 2 0  Altered sleeping   1 3  Tired, decreased energy   1 0  Change in appetite   1 0  Feeling bad or failure about yourself    1 0  Trouble concentrating   1 0  Moving slowly or fidgety/restless   0 0  Suicidal thoughts   0 0  PHQ-9 Score   7 3      Review of Systems  Musculoskeletal:  Positive for back pain, gait problem, joint swelling and myalgias.  All other systems reviewed and are negative.     Objective:   Physical Exam        Assessment & Plan:  Right Lumbar Radiculitis: Continue HEP as tolerated. Continue Gabapentin. Continue to Monitor. 04/10/2022 Fibromyalgia: Continue HEP as tolerated. Continue Gabapentin. Continue to Monitor. 04/10/2022 Rheumatoid Arthritis: Rheumatology Following.  Chronic Pain Syndrome: Oral Swab ordered today, awaiting results. If consistent Oxycodone will be prescribed.  5. Left Leg Cellulitis: Wound Care Following , patient  reports. Continue to monitor.      F/U in 1 month

## 2022-05-07 ENCOUNTER — Telehealth: Payer: Self-pay

## 2022-05-07 MED ORDER — METHOCARBAMOL 500 MG PO TABS: 500.0000 mg | ORAL_TABLET | Freq: Every day | ORAL | 1 refills | Status: DC | PRN

## 2022-05-07 NOTE — Telephone Encounter (Signed)
Return Brittany Tran call,  She was requesting a Muscle Relaxer.  She states she had a renal biopsy of a renal mass and was diagnosed with cancer.  Urology following.  Last BUN 12 and Creatine 0.60,  Call placed to [facility] Pharmacist, to discuss Methocarbamol.  Methocarbamol ordered today, she was instructed to F/U with her Urologist. She verbalizes understanding.

## 2022-06-04 ENCOUNTER — Encounter: Payer: Self-pay | Admitting: Registered Nurse

## 2022-06-04 ENCOUNTER — Encounter: Payer: BC Managed Care – PPO | Attending: Registered Nurse | Admitting: Registered Nurse

## 2022-06-04 VITALS — BP 138/83 | HR 67 | Ht 66.0 in | Wt 261.0 lb

## 2022-06-04 DIAGNOSIS — M5416 Radiculopathy, lumbar region: Secondary | ICD-10-CM

## 2022-06-04 DIAGNOSIS — G8929 Other chronic pain: Secondary | ICD-10-CM

## 2022-06-04 DIAGNOSIS — M25511 Pain in right shoulder: Secondary | ICD-10-CM | POA: Diagnosis present

## 2022-06-04 DIAGNOSIS — M25561 Pain in right knee: Secondary | ICD-10-CM | POA: Insufficient documentation

## 2022-06-04 DIAGNOSIS — M25562 Pain in left knee: Secondary | ICD-10-CM | POA: Diagnosis present

## 2022-06-04 DIAGNOSIS — M797 Fibromyalgia: Secondary | ICD-10-CM | POA: Diagnosis present

## 2022-06-04 DIAGNOSIS — M2550 Pain in unspecified joint: Secondary | ICD-10-CM | POA: Diagnosis present

## 2022-06-04 DIAGNOSIS — G894 Chronic pain syndrome: Secondary | ICD-10-CM

## 2022-06-04 DIAGNOSIS — Z5181 Encounter for therapeutic drug level monitoring: Secondary | ICD-10-CM | POA: Diagnosis present

## 2022-06-04 DIAGNOSIS — M25512 Pain in left shoulder: Secondary | ICD-10-CM | POA: Diagnosis present

## 2022-06-04 DIAGNOSIS — Z79899 Other long term (current) drug therapy: Secondary | ICD-10-CM | POA: Diagnosis present

## 2022-06-04 MED ORDER — OXYCODONE HCL 5 MG PO TABS: 5.0000 mg | ORAL_TABLET | Freq: Every day | ORAL | 0 refills | Status: DC | PRN

## 2022-06-04 NOTE — Progress Notes (Signed)
Subjective:    Patient ID: Brittany Tran, female    DOB: 1960-11-20, 61 y.o.   MRN: [id number]  HPI: Brittany Tran is a 61 y.o. female who returns for follow up appointment for chronic pain and medication refill. She states her pain is located in her  bilateral shoulders, lower back radiating into her right lower extremity, bilateral knee pain and generalized joint pain. She rates her pain 10. Her current exercise regime is walking.   [name] Morphine equivalent is 7.50 MME.   Last oral swab was performed on 04/10/2022, see note for details.    Pain Inventory Average Pain 10 Pain Right Now 10 My pain is sharp  In the last 24 hours, has pain interfered with the following? General activity 5 Relation with others 5 Enjoyment of life 5 What TIME of day is your pain at its worst? morning , daytime, evening, and night Sleep (in general) Poor  Pain is worse with: bending, sitting, and standing Pain improves with: medication Relief from Meds: 5  Family History  Problem Relation Age of Onset   Diabetes Mother    Social History   Socioeconomic History   Marital status: Single    Spouse name: Not on file   Number of children: Not on file   Years of education: Not on file   Highest education level: Not on file  Occupational History   Occupation: unemployed  Tobacco Use   Smoking status: Never   Smokeless tobacco: Never  Vaping Use   Vaping Use: Never used  Substance and Sexual Activity   Alcohol use: No   Drug use: No   Sexual activity: Not on file  Other Topics Concern   Not on file  Social History Narrative   Not on file   Social Determinants of Health   Financial Resource Strain: Not on file  Food Insecurity: Not on file  Transportation Needs: Not on file  Physical Activity: Not on file  Stress: Not on file  Social Connections: Not on file   Past Surgical History:  Procedure Laterality Date   HARDWARE REMOVAL Left 09/29/2014   Procedure: REMOVAL OF  DEEP IMPLANT OF LEFT FIBULA  AND TIBIA (SEPARATE INCISIONS);  Surgeon: Wylene Simmer, MD;  Location: [facility];  Service: Orthopedics;  Laterality: Left;   ORIF ANKLE FRACTURE  1988   left   Past Surgical History:  Procedure Laterality Date   HARDWARE REMOVAL Left 09/29/2014   Procedure: REMOVAL OF DEEP IMPLANT OF LEFT FIBULA  AND TIBIA (SEPARATE INCISIONS);  Surgeon: Wylene Simmer, MD;  Location: [facility];  Service: Orthopedics;  Laterality: Left;   ORIF ANKLE FRACTURE  1988   left   Past Medical History:  Diagnosis Date   Cellulitis    Diabetes mellitus without complication (HCC)    Diastolic heart failure (HCC)    Echo 03/14/15 Grade I DD   Fibromyalgia    History of DVT (deep vein thrombosis)    Hypertension    Rheumatoid arthritis ([place])    Sepsis ([place]) 12/2019   BP 138/83   Pulse 67   Ht '5\' 6"'$  (1.676 m)   Wt 261 lb (118.4 kg)   SpO2 97%   BMI 42.13 kg/m   Opioid Risk Score:   Fall Risk Score:  `1  Depression screen [facility] 2/9     04/10/2022   11:54 AM 01/09/2022    1:41 PM 12/04/2021    9:45 AM 06/22/2018   11:54  AM  Depression screen PHQ 2/9  Decreased Interest 0 1 1 0  Down, Depressed, Hopeless 0 1 1 0  PHQ - 2 Score 0 2 2 0  Altered sleeping   1 3  Tired, decreased energy   1 0  Change in appetite   1 0  Feeling bad or failure about yourself    1 0  Trouble concentrating   1 0  Moving slowly or fidgety/restless   0 0  Suicidal thoughts   0 0  PHQ-9 Score   7 3      Review of Systems  Musculoskeletal:  Positive for back pain and gait problem.  All other systems reviewed and are negative.     Objective:   Physical Exam Vitals and nursing note reviewed.  Constitutional:      Appearance: Normal appearance.  Cardiovascular:     Rate and Rhythm: Normal rate and regular rhythm.     Pulses: Normal pulses.     Heart sounds: Normal heart sounds.  Pulmonary:     Effort: Pulmonary effort is normal.     Breath sounds: Normal  breath sounds.  Musculoskeletal:     Cervical back: Normal range of motion and neck supple.     Comments: Normal Muscle Bulk and Muscle Testing Reveals:  Upper Extremities: Full: ROM and Muscle Strength 5/5 Bilateral AC Joint Tenderness  Lumbar Paraspinal Tenderness: L-4-L-5 Lower Extremities: Right: Full ROM and Muscle Strength 5/5 Left Lower Extremity: Decreased ROM and Muscle Strength 5/5 Left Lower Extremity Flexion Produces Pain into her Left Patella Left Lower Extremity dressing intact:: Wound-care following Arises from Chair slowly  Narrow Based  Gait     Skin:    General: Skin is warm and dry.  Neurological:     General: No focal deficit present.     Mental Status: She is alert and oriented to person, place, and time.  Psychiatric:        Mood and Affect: Mood normal.        Behavior: Behavior normal.         Assessment & Plan:  Right Lumbar Radiculitis: Continue HEP as tolerated. Continue Gabapentin. Continue to Monitor. 06/04/2022 Fibromyalgia: Continue HEP as tolerated. Continue Gabapentin. Continue to Monitor. 06/04/2022 Rheumatoid Arthritis: Rheumatology Following. 06/04/2022 Chronic Pain Syndrome: Refilled  Oxycodone '5mg'$  daily as needed for pain #30. We will continue the opioid monitoring program, this consists of regular clinic visits, examinations, urine drug screen, pill counts as well as use of [place] Controlled Substance Reporting system. A 12 month History has been reviewed on the [place] Controlled Substance Reporting System 06/04/2022 5. Left Leg Cellulitis: Wound Care Following , patient reports. Continue to monitor. 06/04/2022     F/U in 1 month

## 2022-06-06 IMAGING — DX DG CHEST 2V
3 series · 3 of 3 positions shown · non-contrast
Comparison: Prior chest radiographs 03/12/2017 and earlier

CLINICAL DATA: Suspected sepsis. Additional provided: Shortness of
breath, chills. History of hypertension, diabetes

EXAM:
CHEST - 2 VIEW

[chest lat (1 of 2)]
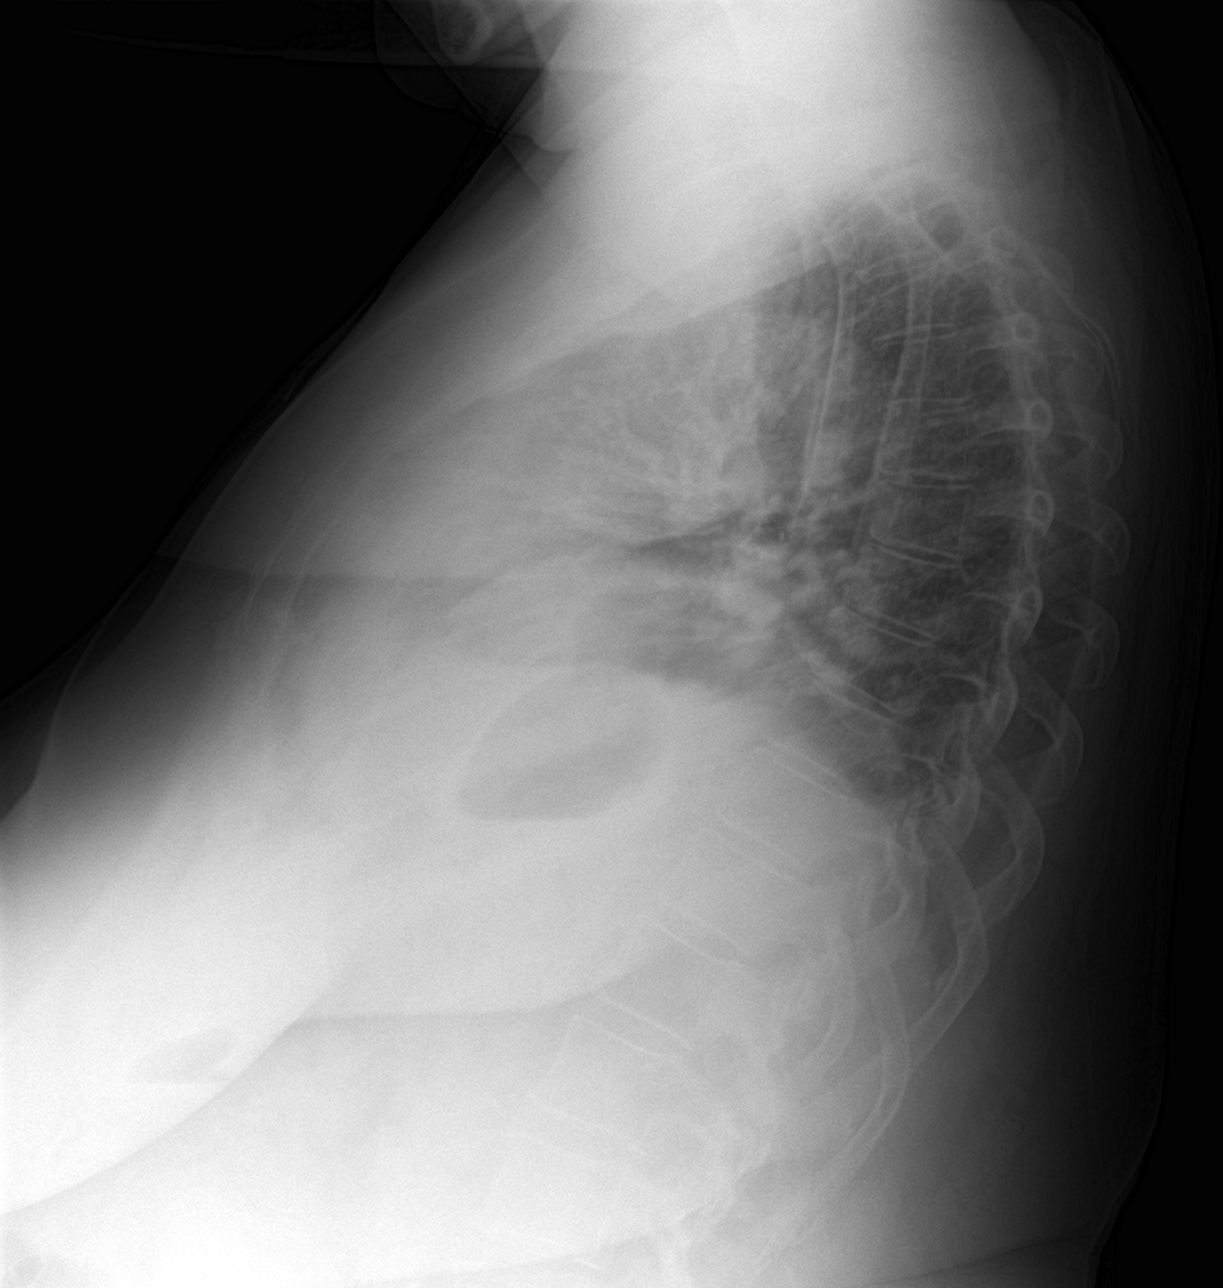

[chest pa]
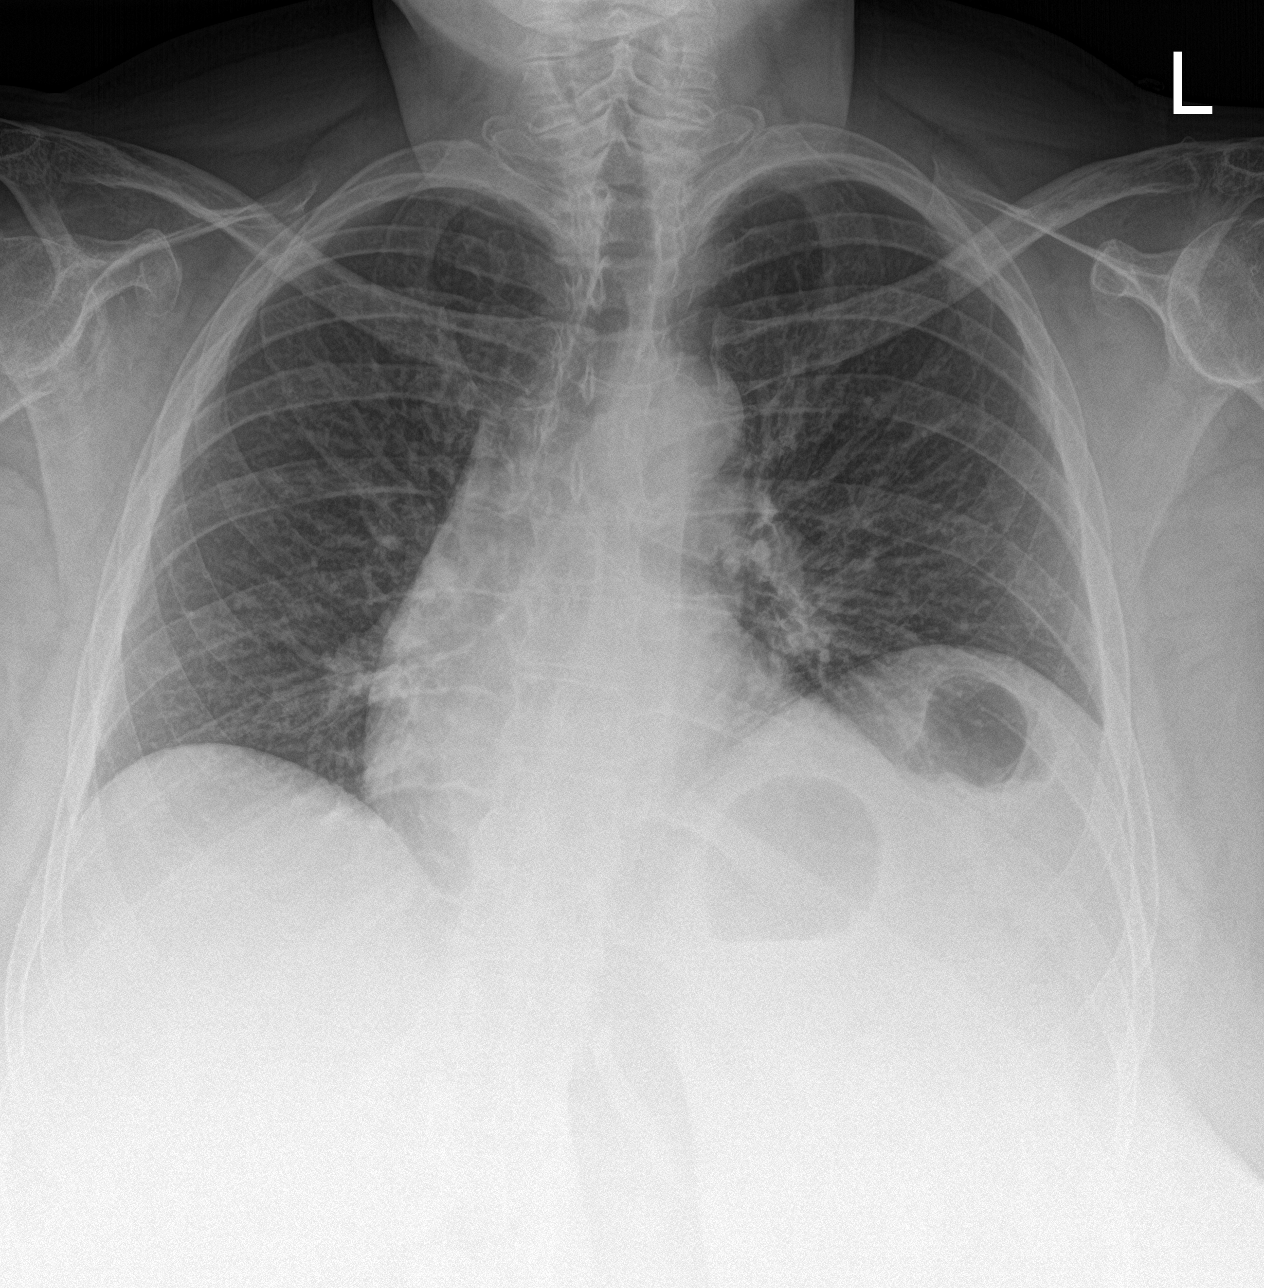

[chest lat (2 of 2)]
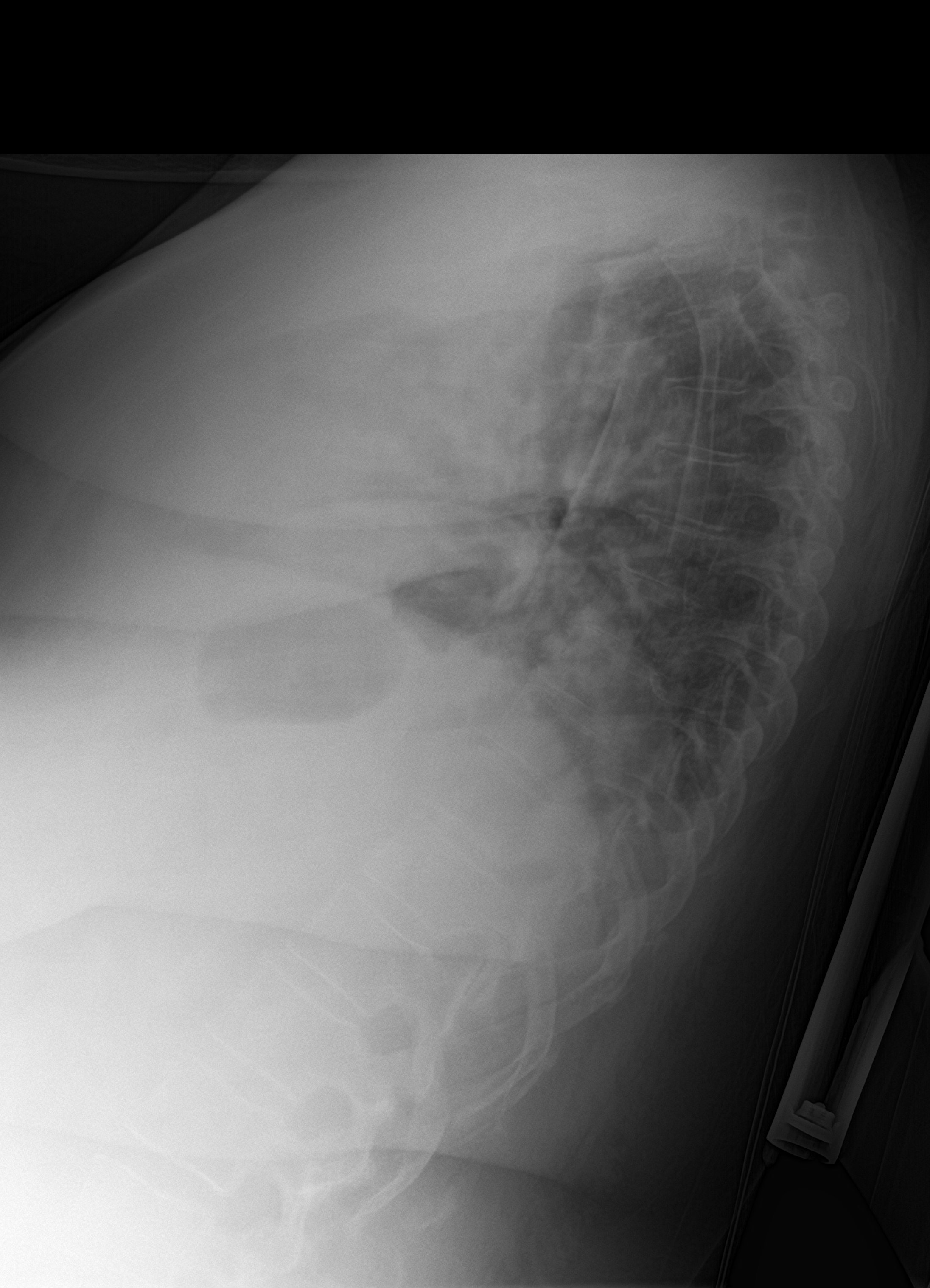

[3 of 3 positions shown; findings below may reference images not displayed]

FINDINGS: Heart size within normal limits. Elevation of the left
hemidiaphragm. An opacity is demonstrated within the left lung base
on lateral view radiograph. No evidence of pleural effusion or
pneumothorax. No acute bony abnormality.
IMPRESSION: Elevation of the left hemidiaphragm. A left lower lobe opacity seen
on the lateral view radiograph may reflect compressive atelectasis
or pneumonia.

The right lung is clear.

## 2022-06-11 ENCOUNTER — Encounter: Payer: Self-pay | Admitting: Registered Nurse

## 2022-06-15 ENCOUNTER — Other Ambulatory Visit: Payer: Self-pay | Admitting: Registered Nurse

## 2022-08-01 ENCOUNTER — Telehealth: Payer: Self-pay | Admitting: Registered Nurse

## 2022-08-01 ENCOUNTER — Other Ambulatory Visit: Payer: Self-pay | Admitting: Physical Medicine and Rehabilitation

## 2022-08-01 ENCOUNTER — Encounter: Payer: No Typology Code available for payment source | Admitting: Registered Nurse

## 2022-08-01 NOTE — Telephone Encounter (Signed)
Placed a call to Brittany Tran, no answer. Question when was the last time she refilled the Topamax and what pharmacy did she use.

## 2022-08-01 NOTE — Telephone Encounter (Signed)
Patient stated she has not Topamax refilled since the first pick-up. Her local pharmacy is [facility] on [address] is [place].

## 2022-08-03 ENCOUNTER — Encounter (HOSPITAL_BASED_OUTPATIENT_CLINIC_OR_DEPARTMENT_OTHER): Payer: Self-pay | Admitting: Emergency Medicine

## 2022-08-03 ENCOUNTER — Other Ambulatory Visit: Payer: Self-pay

## 2022-08-03 ENCOUNTER — Emergency Department (HOSPITAL_BASED_OUTPATIENT_CLINIC_OR_DEPARTMENT_OTHER)
Admission: EM | Admit: 2022-08-03 | Discharge: 2022-08-03 | Disposition: A | Discharge status: Home / Self Care | Payer: No Typology Code available for payment source | Attending: Emergency Medicine | Admitting: Emergency Medicine

## 2022-08-03 ENCOUNTER — Emergency Department (HOSPITAL_BASED_OUTPATIENT_CLINIC_OR_DEPARTMENT_OTHER): Payer: No Typology Code available for payment source

## 2022-08-03 DIAGNOSIS — I509 Heart failure, unspecified: Secondary | ICD-10-CM | POA: Insufficient documentation

## 2022-08-03 DIAGNOSIS — Z79899 Other long term (current) drug therapy: Secondary | ICD-10-CM | POA: Insufficient documentation

## 2022-08-03 DIAGNOSIS — E119 Type 2 diabetes mellitus without complications: Secondary | ICD-10-CM | POA: Insufficient documentation

## 2022-08-03 DIAGNOSIS — I110 Hypertensive heart disease with heart failure: Secondary | ICD-10-CM | POA: Diagnosis not present

## 2022-08-03 DIAGNOSIS — M79601 Pain in right arm: Secondary | ICD-10-CM | POA: Diagnosis present

## 2022-08-03 DIAGNOSIS — Z794 Long term (current) use of insulin: Secondary | ICD-10-CM | POA: Insufficient documentation

## 2022-08-03 LAB — COMPREHENSIVE METABOLIC PANEL
ALT: 12 U/L (ref 0–44)
AST: 19 U/L (ref 15–41)
Albumin: 3.2 g/dL — ABNORMAL LOW (ref 3.5–5.0)
Alkaline Phosphatase: 116 U/L (ref 38–126)
Anion gap: 3 — ABNORMAL LOW (ref 5–15)
BUN: 9 mg/dL (ref 8–23)
CO2: 25 mmol/L (ref 22–32)
Calcium: 8.3 mg/dL — ABNORMAL LOW (ref 8.9–10.3)
Chloride: 111 mmol/L (ref 98–111)
Creatinine, Ser: 0.61 mg/dL (ref 0.44–1.00)
GFR, Estimated: >60 mL/min (ref >60)
Glucose, Bld: 91 mg/dL (ref 70–99)
Potassium: 3.7 mmol/L (ref 3.5–5.1)
Sodium: 139 mmol/L (ref 135–145)
Total Bilirubin: 0.7 mg/dL (ref 0.3–1.2)
Total Protein: 7.2 g/dL (ref 6.5–8.1)

## 2022-08-03 LAB — CBC WITH DIFFERENTIAL/PLATELET
Abs Immature Granulocytes: 0 10*3/uL (ref 0.00–0.07)
Basophils Absolute: 0 10*3/uL (ref 0.0–0.1)
Basophils Relative: 1 %
Eosinophils Absolute: 0.2 10*3/uL (ref 0.0–0.5)
Eosinophils Relative: 5 %
HCT: 37.2 % (ref 36.0–46.0)
Hemoglobin: 12.3 g/dL (ref 12.0–15.0)
Immature Granulocytes: 0 %
Lymphocytes Relative: 48 %
Lymphs Abs: 1.9 10*3/uL (ref 0.7–4.0)
MCH: 31.7 pg (ref 26.0–34.0)
MCHC: 33.1 g/dL (ref 30.0–36.0)
MCV: 95.9 fL (ref 80.0–100.0)
Monocytes Absolute: 0.4 10*3/uL (ref 0.1–1.0)
Monocytes Relative: 9 %
Neutro Abs: 1.4 10*3/uL — ABNORMAL LOW (ref 1.7–7.7)
Neutrophils Relative %: 37 %
Platelets: 117 10*3/uL — ABNORMAL LOW (ref 150–400)
RBC: 3.88 MIL/uL (ref 3.87–5.11)
RDW: 16.8 % — ABNORMAL HIGH (ref 11.5–15.5)
WBC: 3.8 10*3/uL — ABNORMAL LOW (ref 4.0–10.5)
nRBC: 0 % (ref 0.0–0.2)

## 2022-08-03 LAB — PROTIME-INR
INR: 2.1 — ABNORMAL HIGH (ref 0.8–1.2)
Prothrombin Time: 23.6 seconds — ABNORMAL HIGH (ref 11.4–15.2)

## 2022-08-03 LAB — TROPONIN I (HIGH SENSITIVITY): Troponin I (High Sensitivity): 3 ng/L (ref <18)

## 2022-08-03 MED ORDER — PREDNISONE 10 MG (21) PO TBPK: ORAL_TABLET | Freq: Every day | ORAL | 0 refills | Status: DC

## 2022-08-03 MED ORDER — CYCLOBENZAPRINE HCL 10 MG PO TABS: 10.0000 mg | ORAL_TABLET | Freq: Two times a day (BID) | ORAL | 0 refills | Status: DC | PRN

## 2022-08-03 MED ORDER — OXYCODONE-ACETAMINOPHEN 5-325 MG PO TABS
1.0000 | ORAL_TABLET | Freq: Once | ORAL | Status: AC
  Administered 2022-08-03: 1 via ORAL
  Filled 2022-08-03 (×2): qty 1

## 2022-08-03 NOTE — ED Provider Notes (Addendum)
[place] [facility] Provider Note   CSN: [id number] Arrival date & time: 08/03/22  1520     History  Chief Complaint  Patient presents with   Arm Pain    left    Brittany Tran is a 62 y.o. female.   Arm Pain   62 year old female presents emergency department with complaints of left arm pain and mild tingling sensation.  Patient states that symptoms been present since this past Monday.  Reports symptoms have been constant since onset.  Reports worsening of symptoms with movement of affected arm.  States that she usually walks with a cane using her arm.  Denies history of similar symptoms in the past.  Denies visual disturbance baseline, gait abnormality from baseline, other weakness/sensory deficits in upper or lower extremities, slurred speech, facial droop.  Patient on Coumadin secondary to DVT.  Patient states that she was taking at home oxycodone for pain but is since been out and been unable to follow-up with pain management for refill of medication.  Denies fever, chills, cough, congestion, chest pain, shortness of breath, nausea, vomiting.  Denies any rash on left arm.  Past medical history significant for DVT on Coumadin, heart failure, diabetes mellitus, fibromyalgia, rheumatoid arthritis, morbid obesity  Home Medications Prior to Admission medications   Medication Sig Start Date End Date Taking? Authorizing Provider  acetaminophen (TYLENOL) 325 MG tablet Take 2 tablets (650 mg total) by mouth every 6 (six) hours as needed for mild pain or fever. 05/21/18   Roxan Hockey, MD  Docusate Sodium (DSS) 100 MG CAPS Take by mouth. 03/07/22   [provider]  doxycycline (VIBRAMYCIN) 100 MG capsule Take 100 mg by mouth 2 (two) times daily. 03/12/22   [provider]  enoxaparin (LOVENOX) 120 MG/0.8ML injection Inject 0.8 mLs (120 mg total) into the skin every 12 (twelve) hours for 7 days. 03/15/22 03/22/22  Deno Etienne, DO   feeding supplement (ENSURE ENLIVE / ENSURE PLUS) LIQD Take 237 mLs by mouth 2 (two) times daily between meals. 02/12/22   Pokhrel, Corrie Mckusick, MD  folic acid (FOLVITE) 1 MG tablet Take by mouth.    [provider]  furosemide (LASIX) 20 MG tablet Take 1 tablet (20 mg total) by mouth daily. 02/14/22 02/14/23  Pokhrel, Corrie Mckusick, MD  gabapentin (NEURONTIN) 300 MG capsule Take 1 capsule (300 mg total) by mouth 3 (three) times daily. 02/12/22   Pokhrel, Corrie Mckusick, MD  gabapentin (NEURONTIN) 300 MG capsule Take by mouth.    [provider]  glucose blood (TRUE METRIX BLOOD GLUCOSE TEST) test strip Use as instructed 06/22/18   Elsie Stain, MD  hydrocerin (EUCERIN) CREA Apply 1 Application topically 2 (two) times daily. To dry skin and areas of scaling 02/12/22   Pokhrel, Laxman, MD  Insulin Aspart FlexPen (NOVOLOG) 100 UNIT/ML Inject into the skin.    [provider]  Insulin Glargine w/ Trans Port 100 UNIT/ML SOPN Inject into the skin.    [provider]  methocarbamol (ROBAXIN) 500 MG tablet TAKE 1 TABLET(500 MG) BY MOUTH DAILY AS NEEDED FOR MUSCLE SPASMS 06/17/22   Kirsteins, Luanna Salk, MD  methotrexate (RHEUMATREX) 2.5 MG tablet Take 25 mg by mouth once a week.  10/28/19   [provider]  Multiple Vitamin (MULTIVITAMIN WITH MINERALS) TABS tablet Take 1 tablet by mouth daily. 02/13/22   Pokhrel, Corrie Mckusick, MD  nystatin (MYCOSTATIN/NYSTOP) powder Apply topically 2 (two) times daily. Apply to intertriginous area under panniculus 02/12/22  Pokhrel, Laxman, MD  oxyCODONE (OXY IR/ROXICODONE) 5 MG immediate release tablet Take 1 tablet (5 mg total) by mouth daily as needed for moderate pain. 06/04/22   Bayard Hugger, NP  polyethylene glycol (MIRALAX / GLYCOLAX) 17 g packet Take 17 g by mouth daily as needed for moderate constipation or mild constipation. 02/12/22   Pokhrel, Corrie Mckusick, MD  predniSONE (DELTASONE) 1 MG tablet Take by mouth. 05/20/21   [provider]   topiramate (TOPAMAX) 25 MG tablet Take 1 tablet (25 mg total) by mouth 2 (two) times daily. 12/04/21   Raulkar, Clide Deutscher, MD  Topiramate ER (TROKENDI XR) 25 MG CP24 Take by mouth. 03/11/22   [provider]  TRUEPLUS LANCETS 28G MISC Use to check blood sugar daily. 06/22/18   Elsie Stain, MD  Vitamin D, Ergocalciferol, (DRISDOL) 1.25 MG (50000 UNIT) CAPS capsule Take 1 capsule (50,000 Units total) by mouth every 7 (seven) days. 12/26/21   Raulkar, Clide Deutscher, MD  warfarin (COUMADIN) 2 MG tablet SMARTSIG:1 Tablet(s) By Mouth Every Evening    [provider]  warfarin (COUMADIN) 5 MG tablet Take by mouth. 03/21/22 06/19/22  [provider]      Allergies    Penicillins, Vancomycin, Cefazolin, Penicillin g benzathine, and Sulfa antibiotics    Review of Systems   Review of Systems  All other systems reviewed and are negative.   Physical Exam Updated Vital Signs BP (!) 148/73 (BP Location: Right Arm)   Pulse 65   Temp 97.9 F (36.6 C) (Oral)   Resp 18   SpO2 100%  Physical Exam Vitals and nursing note reviewed.  Constitutional:      General: She is not in acute distress.    Appearance: She is well-developed.  HENT:     Head: Normocephalic and atraumatic.  Eyes:     Conjunctiva/sclera: Conjunctivae normal.  Cardiovascular:     Rate and Rhythm: Normal rate and regular rhythm.     Heart sounds: No murmur heard. Pulmonary:     Effort: Pulmonary effort is normal. No respiratory distress.     Breath sounds: Normal breath sounds.  Abdominal:     Palpations: Abdomen is soft.     Tenderness: There is no abdominal tenderness.  Musculoskeletal:        General: No swelling.     Cervical back: Neck supple.     Comments: Patient with full range of motion of left shoulder, elbow, wrist, digits.  No overlying skin abnormalities appreciated.  Radial pulses 2+ bilaterally.  Mild tenderness along lateral forearm and along biceps.  DTR symmetric.  No obvious swelling  appreciated.  No obvious erythema, fluctuance, indurated skin.  No midline tenderness of cervical, thoracic, lumbar spine with no obvious step-off or deformity noted.  Mild paraspinal tenderness in the left cervical region.  Skin:    General: Skin is warm and dry.     Capillary Refill: Capillary refill takes less than 2 seconds.  Neurological:     Mental Status: She is alert.     Comments: Alert and oriented to self, place, time and event.   Speech is fluent, clear without dysarthria or dysphasia.   Strength 5/5 in upper/lower extremities   Sensation intact in upper/lower extremities besides area as listed below: Patient with mild tingling sensation along medial aspect of left forearm extension down into the hand.  Normal gait for patient's baseline CN I not tested  CN II not tested CN III, IV, VI PERRLA and EOMs intact  bilaterally  CN V Intact sensation to sharp and light touch to the face  CN VII facial movements symmetric  CN VIII not tested  CN IX, X no uvula deviation, symmetric rise of soft palate  CN XI 5/5 SCM and trapezius strength bilaterally  CN XII Midline tongue protrusion, symmetric L/R movements     Psychiatric:        Mood and Affect: Mood normal.     ED Results / Procedures / Treatments   Labs (all labs ordered are listed, but only abnormal results are displayed) Labs Reviewed  COMPREHENSIVE METABOLIC PANEL  CBC WITH DIFFERENTIAL/PLATELET  PROTIME-INR  TROPONIN I (HIGH SENSITIVITY)    EKG EKG Interpretation  Date/Time:  Saturday August 03 2022 15:54:06 EST Ventricular Rate:  67 PR Interval:  154 QRS Duration: 91 QT Interval:  412 QTC Calculation: 435 R Axis:   -12 Text Interpretation: Sinus rhythm Abnormal R-wave progression, early transition Since last tracing rate slower Otherwise no significant change Confirmed by Deno Etienne [phone]) on 08/03/2022 3:58:02 PM  Radiology No results found.  Procedures Procedures    Medications Ordered in  ED Medications  oxyCODONE-acetaminophen (PERCOCET/ROXICET) 5-325 MG per tablet 1 tablet (0 tablets Oral Hold 08/03/22 1854)    ED Course/ Medical Decision Making/ A&P                             Medical Decision Making Amount and/or Complexity of Data Reviewed Labs: ordered.  Risk Prescription drug management.   This patient presents to the ED for concern of left arm pain, this involves an extensive number of treatment options, and is a complaint that carries with it a high risk of complications and morbidity.  The differential diagnosis includes cervical radiculopathy, ACS, PE, ischemic limb, left upper extremity DVT, fracture, strain sprain, dislocation, CVA   Co morbidities that complicate the patient evaluation  See HPI   Additional history obtained:  Additional history obtained from EMR External records from outside source obtained and reviewed including hospital records   Lab Tests:  Lab test pending  Imaging Studies ordered:  Imaging studies pending   Cardiac Monitoring: / EKG:  The patient was maintained on a cardiac monitor.  I personally viewed and interpreted the cardiac monitored which showed an underlying rhythm of: Sinus rhythm without acute ischemic changes from prior EKGs performed.   Consultations Obtained:  N/a   Problem List / ED Course / Critical interventions / Medication management  Left arm pain I ordered medication including Percocet   Reevaluation of the patient after these medicines showed that the patient improved I have reviewed the patients home medicines and have made adjustments as needed   Social Determinants of Health:  Denies tobacco, licit drug use   Test / Admission - Considered:  Left arm pain Vitals signs significant for hypertension with blood pressure 148/73.  Recommend follow-up with primary care regarding elevation blood pressure.. Otherwise within normal range and stable throughout visit. Laboratory/imaging  studies significant for: Results pending Patient's pain most consistent with cervical radiculopathy.  Left upper extremity ultrasound ordered due to patient's history of DVT with dull achy type pain described.  Doubt ACS given lack of EKG findings, lack of chest pain, shortness of breath.  Pain seems to be coming from cervical region given reproducible tenderness left paraspinally.  No clinical evidence for acute fracture/dislocation.  Most of workup still pending at this time.  At shift change, patient care handed off  to PA-C Lyn Hollingshead.  Patient stable upon discharge        Final Clinical Impression(s) / ED Diagnoses Final diagnoses:  None    Rx / DC Orders ED Discharge Orders     None            Wilnette Kales, [place] 08/03/22 [place], [place], DO 08/03/22 1913

## 2022-08-03 NOTE — ED Triage Notes (Signed)
Left arm pain and tingling to fingers , x 2 days . Denies chest pain or shortness of breath  No injury to arm .  Hx CHF

## 2022-08-03 NOTE — ED Provider Notes (Signed)
  Physical Exam  BP (!) 148/73   Pulse 67   Temp 97.9 F (36.6 C) (Oral)   Resp 16   SpO2 100%   Physical Exam  Procedures  Procedures  ED Course / MDM    Medical Decision Making Amount and/or Complexity of Data Reviewed Labs: ordered.  Risk Prescription drug management.   ***

## 2022-08-03 NOTE — Discharge Instructions (Addendum)
Please take your medications as prescribed. Take tylenol/ibuprofen and flexeril for pain. I recommend close follow-up with PCP for reevaluation.  Please do not hesitate to return to emergency department if worrisome signs symptoms we discussed become apparent.

## 2022-08-05 ENCOUNTER — Ambulatory Visit: Payer: Self-pay | Admitting: Registered Nurse

## 2022-08-06 ENCOUNTER — Encounter: Payer: Self-pay | Admitting: Physical Medicine and Rehabilitation

## 2022-08-06 ENCOUNTER — Encounter
Payer: No Typology Code available for payment source | Attending: Registered Nurse | Admitting: Physical Medicine and Rehabilitation

## 2022-08-06 ENCOUNTER — Encounter: Payer: No Typology Code available for payment source | Admitting: Registered Nurse

## 2022-08-06 VITALS — BP 146/94 | HR 77 | Ht 66.0 in | Wt 240.0 lb

## 2022-08-06 DIAGNOSIS — N281 Cyst of kidney, acquired: Secondary | ICD-10-CM | POA: Insufficient documentation

## 2022-08-06 DIAGNOSIS — M5430 Sciatica, unspecified side: Secondary | ICD-10-CM | POA: Insufficient documentation

## 2022-08-06 DIAGNOSIS — R202 Paresthesia of skin: Secondary | ICD-10-CM | POA: Diagnosis present

## 2022-08-06 MED ORDER — OXYCODONE HCL 5 MG PO TABS: 5.0000 mg | ORAL_TABLET | Freq: Two times a day (BID) | ORAL | 0 refills | Status: DC | PRN

## 2022-08-06 MED ORDER — GABAPENTIN 300 MG PO CAPS: 300.0000 mg | ORAL_CAPSULE | Freq: Three times a day (TID) | ORAL | Status: DC

## 2022-08-06 NOTE — Progress Notes (Signed)
Subjective:    Patient ID: Brittany Tran, female    DOB: 10/05/1960, 62 y.o.   MRN: [id number]  HPI Brittany Tran is a 62 year old woman who presents for follow-up of chronic pain.  1) Chronic pain: -she usually takes one Percocet per day for her rheumatoid arthritis pain and fibromyalgia.  -the rain worsens her pain -last visit we tried her on Topamax and this did not help her pain but it did help her to sleep well at night and to lose weight.  -she would like to restart the Percocet but is unable to com in to the clinic right now due to her work.  -her shoulder and knee pain is worst -her rheumatologist does not want to prescribe opioids -has not tried Lyrica, Cymbalta, Elavil, or Savella -scared of water.   2) Restless leg syndrome: -not taking any sleep medicine right now  3) Tingling in thumb: -started when she got a blood draw -radiates up to her neck on her left side -she went to the urgent care center on Friday and they did an ultrasound to check for blood clots in her lung.  -she was told she has a pinched nerve -she was given a muscle relaxer  4) Right kidney cyst -did not take any prednisone because she has a surgery next week for kidney cyst  Pain Inventory Average Pain 10 Pain Right Now 10 My pain is constant and sharp  In the last 24 hours, has pain interfered with the following? General activity 5 Relation with others 5 Enjoyment of life 5 What TIME of day is your pain at its worst? morning , daytime, evening, and night Sleep (in general) Poor  Pain is worse with: sitting Pain improves with: medication Relief from Meds: 8  use a cane do you drive?  yes use a wheelchair Do you have any goals in this area?  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  no  Any changes since last visit?  no    Family History  Problem Relation Age of Onset   Diabetes Mother    Social History    Socioeconomic History   Marital status: Single    Spouse name: Not on file   Number of children: Not on file   Years of education: Not on file   Highest education level: Not on file  Occupational History   Occupation: unemployed  Tobacco Use   Smoking status: Never   Smokeless tobacco: Never  Vaping Use   Vaping Use: Never used  Substance and Sexual Activity   Alcohol use: No   Drug use: No   Sexual activity: Not on file  Other Topics Concern   Not on file  Social History Narrative   Not on file   Social Determinants of Health   Financial Resource Strain: Not on file  Food Insecurity: Not on file  Transportation Needs: Not on file  Physical Activity: Not on file  Stress: Not on file  Social Connections: Not on file   Past Surgical History:  Procedure Laterality Date   HARDWARE REMOVAL Left 09/29/2014   Procedure: REMOVAL OF DEEP IMPLANT OF LEFT FIBULA  AND TIBIA (SEPARATE INCISIONS);  Surgeon: Wylene Simmer, MD;  Location: [facility];  Service: Orthopedics;  Laterality: Left;   ORIF ANKLE FRACTURE  1988   left   Past Medical History:  Diagnosis Date   Cellulitis  Diabetes mellitus without complication (HCC)    Diastolic heart failure (HCC)    Echo 03/14/15 Grade I DD   Fibromyalgia    History of DVT (deep vein thrombosis)    Hypertension    Rheumatoid arthritis (HCC)    Sepsis ([place]) 12/2019   BP (!) 146/94   Pulse 77   Ht 5' 6"$  (1.676 m)   Wt 240 lb (108.9 kg)   SpO2 97%   BMI 38.74 kg/m   Opioid Risk Score:   Fall Risk Score:  `1  Depression screen [facility] 2/9     08/06/2022    9:40 AM 04/10/2022   11:54 AM 01/09/2022    1:41 PM 12/04/2021    9:45 AM 06/22/2018   11:54 AM  Depression screen PHQ 2/9  Decreased Interest 0 0 1 1 0  Down, Depressed, Hopeless 0 0 1 1 0  PHQ - 2 Score 0 0 2 2 0  Altered sleeping    1 3  Tired, decreased energy    1 0  Change in appetite    1 0  Feeling bad or failure about yourself     1 0  Trouble  concentrating    1 0  Moving slowly or fidgety/restless    0 0  Suicidal thoughts    0 0  PHQ-9 Score    7 3    Review of Systems  Musculoskeletal:  Positive for back pain and gait problem.       Pain in both knees both shoulder, upper chest area pian  All other systems reviewed and are negative.      Objective:   Physical Exam Gen: no distress, normal appearing, 240 lbs, BP 146/94 HEENT: oral mucosa pink and moist, NCAT Cardio: Reg rate Chest: normal effort, normal rate of breathing Abd: soft, non-distended Ext: no edema Psych: pleasant, normal affect Skin: intact Neuro/MSK: trigger points palpable in upper extremities, negative Spurling's test    Assessment & Plan:   1) Fibromyalgia is a clinical syndrome characterized by widespread pain and tenderness in addition to a variety of symptoms, including fatigue, anxiety, depression, and sleep disturbances. Fibromyalgia affects 3-5% of women and up to 25% of women with other rheumatological conditions.   Exercise is a first-line treatment for the disease, and can help with both the physical and emotional symptoms, as well as improving overall health and function.    -Provided with a pain relief journal and discussed that it contains foods and lifestyle tips to naturally help to improve pain. Discussed that these lifestyle strategies are also very good for health unlike some medications which can have negative side effects. Discussed that the act of keeping a journal can be therapeutic and helpful to realize patterns what helps to trigger and alleviate pain.    2) Sciatic nerve pain: -continue Topamax 52m daily -will send staff message to SJudeen Hammansto have her come in for appointment with EZella Ballto sign pain contract and obtain urine sample since the topamax is not helping enough with her pain, she does not want to try additional nerve agents as is afraid of their side effect profile, she would like to restart her Percocet since she  tolerated this well in the past and takes no more than two 532mpercocet per day, provided refill -discussed mechanism of action of low dose naltrexone as an opioid receptor antagonist which stimulates your body's production of its own natural endogenous opioids, helping to decrease pain. Discussed that it can also decrease  T cell response and thus be helpful in decreasing inflammation, and symptoms of brain fog, fatigue, anxiety, depression, and allergies. Discussed that this medication needs to be compounded at a compounding pharmacy and can more expensive. Discussed that I usually start at 89m and if this is not providing enough relief then I titrate upward on a monthly basis.    3) Cervical radiculitis: -gabapentin 304mTID prescribed -discussed XR. -recommended heating pad.  -Discussed current symptoms of pain and history of pain.  -Discussed benefits of exercise in reducing pain. -Discussed following foods that may reduce pain: 1) Ginger (especially studied for arthritis)- reduce leukotriene production to decrease inflammation 2) Blueberries- high in phytonutrients that decrease inflammation 3) Salmon- marine omega-3s reduce joint swelling and pain 4) Pumpkin seeds- reduce inflammation 5) dark chocolate- reduces inflammation 6) turmeric- reduces inflammation 7) tart cherries - reduce pain and stiffness 8) extra virgin olive oil - its compound olecanthal helps to block prostaglandins  9) chili peppers- can be eaten or applied topically via capsaicin 10) mint- helpful for headache, muscle aches, joint pain, and itching 11) garlic- reduces inflammation  Link to further information on diet for chronic pain: [email or web address] 4) Kidney cyst: -recommended not to take prednisone prior to surgery

## 2022-08-06 NOTE — Progress Notes (Deleted)
Subjective:    Patient ID: Brittany Tran, female    DOB: 1960-09-03, 62 y.o.   MRN: [id number]  HPI: Brittany Tran is a 62 y.o. female who returns for follow up appointment for chronic pain and medication refill. She states her pain is located in her  bilateral shoulders, lower back radiating into her right lower extremity, bilateral knee pain and generalized joint pain. She rates her pain 10. Her current exercise regime is walking.   Brittany Tran Morphine equivalent is 7.50 MME.   Last oral swab was performed on 04/10/2022, see note for details.    Pain Inventory Average Pain 10 Pain Right Now 10 My pain is sharp  In the last 24 hours, has pain interfered with the following? General activity 5 Relation with others 5 Enjoyment of life 5 What TIME of day is your pain at its worst? morning , daytime, evening, and night Sleep (in general) Poor  Pain is worse with: bending, sitting, and standing Pain improves with: medication Relief from Meds: 5  Family History  Problem Relation Age of Onset  . Diabetes Mother    Social History   Socioeconomic History  . Marital status: Single    Spouse name: Not on file  . Number of children: Not on file  . Years of education: Not on file  . Highest education level: Not on file  Occupational History  . Occupation: unemployed  Tobacco Use  . Smoking status: Never  . Smokeless tobacco: Never  Vaping Use  . Vaping Use: Never used  Substance and Sexual Activity  . Alcohol use: No  . Drug use: No  . Sexual activity: Not on file  Other Topics Concern  . Not on file  Social History Narrative  . Not on file   Social Determinants of Health   Financial Resource Strain: Not on file  Food Insecurity: Not on file  Transportation Needs: Not on file  Physical Activity: Not on file  Stress: Not on file  Social Connections: Not on file   Past Surgical History:  Procedure Laterality Date  . HARDWARE REMOVAL Left 09/29/2014    Procedure: REMOVAL OF DEEP IMPLANT OF LEFT FIBULA  AND TIBIA (SEPARATE INCISIONS);  Surgeon: Wylene Simmer, MD;  Location: [facility];  Service: Orthopedics;  Laterality: Left;  . ORIF ANKLE FRACTURE  1988   left   Past Surgical History:  Procedure Laterality Date  . HARDWARE REMOVAL Left 09/29/2014   Procedure: REMOVAL OF DEEP IMPLANT OF LEFT FIBULA  AND TIBIA (SEPARATE INCISIONS);  Surgeon: Wylene Simmer, MD;  Location: [place] [facility] [place];  Service: Orthopedics;  Laterality: Left;  . ORIF ANKLE FRACTURE  1988   left   Past Medical History:  Diagnosis Date  . Cellulitis   . Diabetes mellitus without complication ([place])   . Diastolic heart failure ([place])    Echo 03/14/15 Grade I DD  . Fibromyalgia   . History of DVT (deep vein thrombosis)   . Hypertension   . Rheumatoid arthritis ([place])   . Sepsis ([place]) 12/2019   BP (!) 146/94   Pulse 77   Ht 5' 6"$  (1.676 m)   Wt 240 lb (108.9 kg)   SpO2 97%   BMI 38.74 kg/m   Opioid Risk Score:   Fall Risk Score:  `1  Depression screen [facility] 2/9     08/06/2022    9:40 AM 04/10/2022   11:54 AM 01/09/2022    1:41 PM 12/04/2021  9:45 AM 06/22/2018   11:54 AM  Depression screen PHQ 2/9  Decreased Interest 0 0 1 1 0  Down, Depressed, Hopeless 0 0 1 1 0  PHQ - 2 Score 0 0 2 2 0  Altered sleeping    1 3  Tired, decreased energy    1 0  Change in appetite    1 0  Feeling bad or failure about yourself     1 0  Trouble concentrating    1 0  Moving slowly or fidgety/restless    0 0  Suicidal thoughts    0 0  PHQ-9 Score    7 3      Review of Systems  Musculoskeletal:  Positive for back pain, gait problem and neck pain.  All other systems reviewed and are negative.      Objective:   Physical Exam Vitals and nursing note reviewed.  Constitutional:      Appearance: Normal appearance.  Cardiovascular:     Rate and Rhythm: Normal rate and regular rhythm.     Pulses: Normal pulses.     Heart sounds: Normal heart sounds.   Pulmonary:     Effort: Pulmonary effort is normal.     Breath sounds: Normal breath sounds.  Musculoskeletal:     Cervical back: Normal range of motion and neck supple.     Comments: Normal Muscle Bulk and Muscle Testing Reveals:  Upper Extremities: Full: ROM and Muscle Strength 5/5 Bilateral AC Joint Tenderness  Lumbar Paraspinal Tenderness: L-4-L-5 Lower Extremities: Right: Full ROM and Muscle Strength 5/5 Left Lower Extremity: Decreased ROM and Muscle Strength 5/5 Left Lower Extremity Flexion Produces Pain into her Left Patella Left Lower Extremity dressing intact:: Wound-care following Arises from Chair slowly  Narrow Based  Gait     Skin:    General: Skin is warm and dry.  Neurological:     General: No focal deficit present.     Mental Status: She is alert and oriented to person, place, and time.  Psychiatric:        Mood and Affect: Mood normal.        Behavior: Behavior normal.         Assessment & Plan:  Right Lumbar Radiculitis: Continue HEP as tolerated. Continue Gabapentin. Continue to Monitor. 06/04/2022 Fibromyalgia: Continue HEP as tolerated. Continue Gabapentin. Continue to Monitor. 06/04/2022 Rheumatoid Arthritis: Rheumatology Following. 06/04/2022 Chronic Pain Syndrome: Refilled  Oxycodone 69m daily as needed for pain #30. We will continue the opioid monitoring program, this consists of regular clinic visits, examinations, urine drug screen, pill counts [place]. [place] 12 month [place] 06/04/2022 5. Left Leg Cellulitis: Wound Care Following , patient reports. Continue to monitor. 06/04/2022     F/U in 1 month

## 2022-08-06 NOTE — Patient Instructions (Signed)
Foods that may reduce pain: 1) Ginger (especially studied for arthritis)- reduce leukotriene production to decrease inflammation 2) Blueberries- high in phytonutrients that decrease inflammation 3) Salmon- marine omega-3s reduce joint swelling and pain 4) Pumpkin seeds- reduce inflammation 5) dark chocolate- reduces inflammation 6) turmeric- reduces inflammation 7) tart cherries - reduce pain and stiffness 8) extra virgin olive oil - its compound olecanthal helps to block prostaglandins  9) chili peppers- can be eaten or applied topically via capsaicin 10) mint- helpful for headache, muscle aches, joint pain, and itching 11) garlic- reduces inflammation  Link to further information on diet for chronic pain: [email or web address].[email or web address]   Turmeric to reduce inflammation--can be used in cooking or taken as a supplement.  Benefits of turmeric:  -Highly anti-inflammatory  -Increases antioxidants  -Improves memory, attention, brain disease  -Lowers risk of heart disease  -May help prevent cancer  -Decreases pain  -Alleviates depression  -Delays aging and decreases risk of chronic disease  -Consume with black pepper to increase absorption    Turmeric Milk Recipe:  1 cup milk  1 tsp turmeric  1 tsp cinnamon  1 tsp grated ginger (optional)  Black pepper (boosts the anti-inflammatory properties of turmeric).  1 tsp honey

## 2022-08-29 ENCOUNTER — Telehealth: Payer: Self-pay | Admitting: Registered Nurse

## 2022-08-29 NOTE — Telephone Encounter (Signed)
Received letter from [facility], regarding CNS Depressant medication. Medication list was reviewed,

## 2022-09-02 NOTE — Progress Notes (Unsigned)
Subjective:    Patient ID: Brittany Tran, female    DOB: 10/05/1960, 62 y.o.   MRN: [id number]  HPI Brittany Tran is a 62 year old woman who presents for follow-up of chronic pain.  1) Chronic pain: -she usually takes one Percocet per day for her rheumatoid arthritis pain and fibromyalgia.  -the rain worsens her pain -last visit we tried her on Topamax and this did not help her pain but it did help her to sleep well at night and to lose weight.  -she would like to restart the Percocet but is unable to com in to the clinic right now due to her work.  -her shoulder and knee pain is worst -her rheumatologist does not want to prescribe opioids -has not tried Lyrica, Cymbalta, Elavil, or Savella -scared of water.   2) Restless leg syndrome: -not taking any sleep medicine right now  3) Tingling in thumb: -started when she got a blood draw -radiates up to her neck on her left side -she went to the urgent care center on Friday and they did an ultrasound to check for blood clots in her lung.  -she was told she has a pinched nerve -she was given a muscle relaxer  4) Right kidney cyst -did not take any prednisone because she has a surgery next week for kidney cyst  Pain Inventory Average Pain 10 Pain Right Now 10 My pain is constant and sharp  In the last 24 hours, has pain interfered with the following? General activity 5 Relation with others 5 Enjoyment of life 5 What TIME of day is your pain at its worst? morning , daytime, evening, and night Sleep (in general) Poor  Pain is worse with: sitting Pain improves with: medication Relief from Meds: 8  use a cane do you drive?  yes use a wheelchair Do you have any goals in this area?  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  no  Any changes since last visit?  no    Family History  Problem Relation Age of Onset   Diabetes Mother    Social History    Socioeconomic History   Marital status: Single    Spouse name: Not on file   Number of children: Not on file   Years of education: Not on file   Highest education level: Not on file  Occupational History   Occupation: unemployed  Tobacco Use   Smoking status: Never   Smokeless tobacco: Never  Vaping Use   Vaping Use: Never used  Substance and Sexual Activity   Alcohol use: No   Drug use: No   Sexual activity: Not on file  Other Topics Concern   Not on file  Social History Narrative   Not on file   Social Determinants of Health   Financial Resource Strain: Not on file  Food Insecurity: Not on file  Transportation Needs: Not on file  Physical Activity: Not on file  Stress: Not on file  Social Connections: Not on file   Past Surgical History:  Procedure Laterality Date   HARDWARE REMOVAL Left 09/29/2014   Procedure: REMOVAL OF DEEP IMPLANT OF LEFT FIBULA  AND TIBIA (SEPARATE INCISIONS);  Surgeon: Wylene Simmer, MD;  Location: [facility];  Service: Orthopedics;  Laterality: Left;   ORIF ANKLE FRACTURE  1988   left   Past Medical History:  Diagnosis Date   Cellulitis  Diabetes mellitus without complication (HCC)    Diastolic heart failure (HCC)    Echo 03/14/15 Grade I DD   Fibromyalgia    History of DVT (deep vein thrombosis)    Hypertension    Rheumatoid arthritis ([place])    Sepsis ([place]) 12/2019   There were no vitals taken for this visit.  Opioid Risk Score:   Fall Risk Score:  `1  Depression screen [facility] 2/9     08/06/2022    9:40 AM 04/10/2022   11:54 AM 01/09/2022    1:41 PM 12/04/2021    9:45 AM 06/22/2018   11:54 AM  Depression screen PHQ 2/9  Decreased Interest 0 0 1 1 0  Down, Depressed, Hopeless 0 0 1 1 0  PHQ - 2 Score 0 0 2 2 0  Altered sleeping    1 3  Tired, decreased energy    1 0  Change in appetite    1 0  Feeling bad or failure about yourself     1 0  Trouble concentrating    1 0  Moving slowly or fidgety/restless    0 0   Suicidal thoughts    0 0  PHQ-9 Score    7 3    Review of Systems  Musculoskeletal:  Positive for back pain and gait problem.       Pain in both knees both shoulder, upper chest area pian  All other systems reviewed and are negative.      Objective:   Physical Exam Gen: no distress, normal appearing, 240 lbs, BP 146/94 HEENT: oral mucosa pink and moist, NCAT Cardio: Reg rate Chest: normal effort, normal rate of breathing Abd: soft, non-distended Ext: no edema Psych: pleasant, normal affect Skin: intact Neuro/MSK: trigger points palpable in upper extremities, negative Spurling's test    Assessment & Plan:   1) Fibromyalgia is a clinical syndrome characterized by widespread pain and tenderness in addition to a variety of symptoms, including fatigue, anxiety, depression, and sleep disturbances. Fibromyalgia affects 3-5% of women and up to 25% of women with other rheumatological conditions.   Exercise is a first-line treatment for the disease, and can help with both the physical and emotional symptoms, as well as improving overall health and function.    -Provided with a pain relief journal and discussed that it contains foods and lifestyle tips to naturally help to improve pain. Discussed that these lifestyle strategies are also very good for health unlike some medications which can have negative side effects. Discussed that the act of keeping a journal can be therapeutic and helpful to realize patterns what helps to trigger and alleviate pain.    2) Sciatic nerve pain: -continue Topamax 25mg  daily -will send staff message to Judeen Hammans to have her come in for appointment with Zella Ball to sign pain contract and obtain urine sample since the topamax is not helping enough with her pain, she does not want to try additional nerve agents as is afraid of their side effect profile, she would like to restart her Percocet since she tolerated this well in the past and takes no more than two 5mg   percocet per day, provided refill -discussed mechanism of action of low dose naltrexone as an opioid receptor antagonist which stimulates your body's production of its own natural endogenous opioids, helping to decrease pain. Discussed that it can also decrease T cell response and thus be helpful in decreasing inflammation, and symptoms of brain fog, fatigue, anxiety, depression, and allergies. Discussed that this  medication needs to be compounded at a compounding pharmacy and can more expensive. Discussed that I usually start at 1mg  and if this is not providing enough relief then I titrate upward on a monthly basis.    3) Cervical radiculitis: -gabapentin 300mg  TID prescribed -discussed XR. -recommended heating pad.  -Discussed current symptoms of pain and history of pain.  -Discussed benefits of exercise in reducing pain. -Discussed following foods that may reduce pain: 1) Ginger (especially studied for arthritis)- reduce leukotriene production to decrease inflammation 2) Blueberries- high in phytonutrients that decrease inflammation 3) Salmon- marine omega-3s reduce joint swelling and pain 4) Pumpkin seeds- reduce inflammation 5) dark chocolate- reduces inflammation 6) turmeric- reduces inflammation 7) tart cherries - reduce pain and stiffness 8) extra virgin olive oil - its compound olecanthal helps to block prostaglandins  9) chili peppers- can be eaten or applied topically via capsaicin 10) mint- helpful for headache, muscle aches, joint pain, and itching 11) garlic- reduces inflammation  Link to further information on diet for chronic pain: [email or web address]   4) Kidney cyst: -recommended not to take prednisone prior to surgery

## 2022-09-03 ENCOUNTER — Encounter
Payer: No Typology Code available for payment source | Attending: Registered Nurse | Admitting: Physical Medicine and Rehabilitation

## 2022-09-03 ENCOUNTER — Encounter: Payer: Self-pay | Admitting: Physical Medicine and Rehabilitation

## 2022-09-03 VITALS — BP 131/84 | HR 72 | Ht 66.0 in | Wt 245.0 lb

## 2022-09-03 DIAGNOSIS — G894 Chronic pain syndrome: Secondary | ICD-10-CM | POA: Insufficient documentation

## 2022-09-03 DIAGNOSIS — Z5181 Encounter for therapeutic drug level monitoring: Secondary | ICD-10-CM | POA: Diagnosis not present

## 2022-09-03 DIAGNOSIS — Z79899 Other long term (current) drug therapy: Secondary | ICD-10-CM | POA: Insufficient documentation

## 2022-09-03 MED ORDER — TOPIRAMATE 25 MG PO TABS: 25.0000 mg | ORAL_TABLET | Freq: Two times a day (BID) | ORAL | 3 refills | Status: DC

## 2022-09-03 MED ORDER — DULOXETINE HCL 20 MG PO CPEP: 20.0000 mg | ORAL_CAPSULE | Freq: Every day | ORAL | 3 refills | Status: DC

## 2022-09-03 MED ORDER — OXYCODONE HCL 5 MG PO TABS: 5.0000 mg | ORAL_TABLET | Freq: Two times a day (BID) | ORAL | 0 refills | Status: DC | PRN

## 2022-09-03 NOTE — Patient Instructions (Signed)
Insomnia: ?-Try to go outside near sunrise ?-Get exercise during the day.  ?-Turn off all devices an hour before bedtime.  ?-Teas that can benefit: chamomile, valerian root, Brahmi (Bacopa) ?-Can consider over the counter melatonin, magnesium, and/or L-theanine. Melatonin is an anti-oxidant with multiple health benefits. Magnesium is involved in greater than 300 enzymatic reactions in the body and most of us are deficient as our soil is often depleted. There are 7 different types of magnesium- Bioptemizer's is a supplement with all 7 types, and each has unique benefits. Magnesium can also help with constipation and anxiety.  ?-Pistachios naturally increase the production of melatonin ?-Cozy Earth bamboo bed sheets are free from toxic chemicals.  ?-Tart cherry juice or a tart cherry supplement can improve sleep and soreness post-workout ?  ? ? ?

## 2022-09-06 LAB — TOXASSURE SELECT,+ANTIDEPR,UR

## 2022-09-20 ENCOUNTER — Telehealth: Payer: Self-pay | Admitting: *Deleted

## 2022-09-20 NOTE — Telephone Encounter (Signed)
Urine drug screen for this encounter is consistent for prescribed medication 

## 2022-09-30 ENCOUNTER — Other Ambulatory Visit: Payer: Self-pay | Admitting: Physical Medicine & Rehabilitation

## 2022-10-03 ENCOUNTER — Encounter: Payer: Self-pay | Admitting: Physical Medicine and Rehabilitation

## 2022-10-03 ENCOUNTER — Encounter
Payer: No Typology Code available for payment source | Attending: Registered Nurse | Admitting: Physical Medicine and Rehabilitation

## 2022-10-03 VITALS — BP 124/83 | HR 77 | Temp 97.8°F | Ht 66.0 in | Wt 251.0 lb

## 2022-10-03 DIAGNOSIS — M7918 Myalgia, other site: Secondary | ICD-10-CM | POA: Diagnosis not present

## 2022-10-03 MED ORDER — LIDOCAINE HCL 1 % IJ SOLN
3.0000 mL | Freq: Once | INTRAMUSCULAR | Status: AC
  Administered 2022-10-03: 3 mL

## 2022-10-03 MED ORDER — SODIUM CHLORIDE (PF) 0.9 % IJ SOLN
2.0000 mL | Freq: Once | INTRAMUSCULAR | Status: AC
  Administered 2022-10-03: 2 mL

## 2022-10-03 NOTE — Progress Notes (Signed)

## 2022-11-01 ENCOUNTER — Encounter
Payer: No Typology Code available for payment source | Attending: Registered Nurse | Admitting: Physical Medicine and Rehabilitation

## 2022-11-01 VITALS — BP 119/79 | HR 65 | Ht 66.0 in | Wt 249.0 lb

## 2022-11-01 DIAGNOSIS — M7918 Myalgia, other site: Secondary | ICD-10-CM

## 2022-11-01 MED ORDER — GABAPENTIN 300 MG PO CAPS: 300.0000 mg | ORAL_CAPSULE | Freq: Three times a day (TID) | ORAL | Status: DC

## 2022-11-01 MED ORDER — GABAPENTIN 300 MG PO CAPS: 300.0000 mg | ORAL_CAPSULE | Freq: Three times a day (TID) | ORAL | 3 refills | Status: DC

## 2022-11-01 MED ORDER — LIDOCAINE HCL 1 % IJ SOLN: 3.0000 mL | Freq: Once | INTRAMUSCULAR | Status: AC

## 2022-11-01 MED ORDER — TOPIRAMATE 25 MG PO TABS: 25.0000 mg | ORAL_TABLET | Freq: Two times a day (BID) | ORAL | 3 refills | Status: DC

## 2022-11-01 NOTE — Patient Instructions (Addendum)
Anxiety: -Discussed exercise and meditation as tools to decrease anxiety. -Recommended Down Dog Yoga app -Discussed spending time outdoors. -Discussed positive re-framing of anxiety.  -Discussed the following foods that have been show to reduce anxiety: 1) [place] nuts, mushrooms, soy beans due to their high selenium content. Upper limit of toxicity of selenium is 400mcg/day so no more than 3-4 brazil nuts per day.  2) Fatty fish such as salmon, mackerel, sardines, trout, and herring- high in omega-3 fatty acids 3) Eggs- increases serotonin and dopamine 4) Pumpkin seeds- high in omega-3 fatty acids 5) dark chocolate- high in flavanols that increase blood flow to brain 6) turmeric- take with black pepper to increase absorption 7) chamomile tea- antioxidant and anti-inflammatory properties 8) yogurt without sugar- supports gut-brain axis 9) green tea- contains L- theanine 10) blueberries- high in vitamin C and antioxidants 11) turkey- high in tryptophan which gets converted to serotonin 12) bell peppers- rich in vitamin C and antioxidants 13) citrus fruits- rich in vitamin C and antioxidants 14) almonds- high in vitamin E and healthy fats 15) chia seeds- high in omega-3 fatty acids -Made goal to ____  

## 2022-11-01 NOTE — Progress Notes (Signed)

## 2022-11-04 ENCOUNTER — Encounter (HOSPITAL_BASED_OUTPATIENT_CLINIC_OR_DEPARTMENT_OTHER): Payer: Self-pay | Admitting: Urology

## 2022-11-04 ENCOUNTER — Emergency Department (HOSPITAL_BASED_OUTPATIENT_CLINIC_OR_DEPARTMENT_OTHER)
Admission: EM | Admit: 2022-11-04 | Discharge: 2022-11-04 | Disposition: A | Discharge status: Home / Self Care | Payer: No Typology Code available for payment source | Attending: Emergency Medicine | Admitting: Emergency Medicine

## 2022-11-04 DIAGNOSIS — H00012 Hordeolum externum right lower eyelid: Secondary | ICD-10-CM

## 2022-11-04 DIAGNOSIS — H02842 Edema of right lower eyelid: Secondary | ICD-10-CM | POA: Diagnosis present

## 2022-11-04 MED ORDER — ERYTHROMYCIN 5 MG/GM OP OINT: TOPICAL_OINTMENT | OPHTHALMIC | 0 refills | Status: DC

## 2022-11-04 NOTE — ED Provider Notes (Signed)
[place] EMERGENCY DEPARTMENT AT [facility] Provider Note   CSN: [id number] Arrival date & time: 11/04/22  1358     History  Chief Complaint  Patient presents with   Eye Problem    Brittany Tran is a 62 y.o. female who presents the emergency department complaining of right eye irritation for the past 4 days or so.  Patient states that she initially had some swelling to her right lower eyelid, but she had been placing some cool compresses on.  She notes that yesterday it got more painful, and started bleeding.  No vision changes.  Does have history of cataract in that eye.  Is normally on blood thinners, but has stopped them as she has surgery scheduled for later this week on her kidneys.   Eye Problem      Home Medications Prior to Admission medications   Medication Sig Start Date End Date Taking? Authorizing Provider  erythromycin ophthalmic ointment Place a 1/2 inch ribbon of ointment into the lower eyelid. 11/04/22  Yes Bahar Shelden T, PA-C  acetaminophen (TYLENOL) 325 MG tablet Take 2 tablets (650 mg total) by mouth every 6 (six) hours as needed for mild pain or fever. 05/21/18   Shon Hale, MD  Docusate Sodium (DSS) 100 MG CAPS Take by mouth. 03/07/22   [provider]  DULoxetine (CYMBALTA) 20 MG capsule Take 1 capsule (20 mg total) by mouth daily. 09/03/22   Raulkar, Drema Pry, MD  enoxaparin (LOVENOX) 120 MG/0.8ML injection Inject 0.8 mLs (120 mg total) into the skin every 12 (twelve) hours for 7 days. 03/15/22 03/22/22  Melene Plan, DO  feeding supplement (ENSURE ENLIVE / ENSURE PLUS) LIQD Take 237 mLs by mouth 2 (two) times daily between meals. 02/12/22   Pokhrel, Rebekah Chesterfield, MD  folic acid (FOLVITE) 1 MG tablet Take by mouth.    [provider]  furosemide (LASIX) 20 MG tablet Take 1 tablet (20 mg total) by mouth daily. 02/14/22 02/14/23  Pokhrel, Rebekah Chesterfield, MD  gabapentin (NEURONTIN) 300 MG capsule Take 1 capsule (300 mg total) by mouth 3  (three) times daily. 11/01/22   Raulkar, Drema Pry, MD  glucose blood (TRUE METRIX BLOOD GLUCOSE TEST) test strip Use as instructed 06/22/18   Storm Frisk, MD  hydrocerin (EUCERIN) CREA Apply 1 Application topically 2 (two) times daily. To dry skin and areas of scaling 02/12/22   Pokhrel, Laxman, MD  methocarbamol (ROBAXIN) 500 MG tablet TAKE 1 TABLET(500 MG) BY MOUTH DAILY AS NEEDED FOR MUSCLE SPASMS 09/30/22   Raulkar, Drema Pry, MD  methotrexate (RHEUMATREX) 2.5 MG tablet Take by mouth. 07/05/19   [provider]  Multiple Vitamin (MULTIVITAMIN WITH MINERALS) TABS tablet Take 1 tablet by mouth daily. 02/13/22   Pokhrel, Rebekah Chesterfield, MD  nystatin (MYCOSTATIN/NYSTOP) powder Apply topically 2 (two) times daily. Apply to intertriginous area under panniculus 02/12/22   Pokhrel, Laxman, MD  ondansetron (ZOFRAN-ODT) 4 MG disintegrating tablet Take 4 mg by mouth every 6 (six) hours as needed. 04/23/22   [provider]  oxyCODONE (OXY IR/ROXICODONE) 5 MG immediate release tablet Take 1 tablet (5 mg total) by mouth 2 (two) times daily as needed for severe pain. 09/03/22   Raulkar, Drema Pry, MD  polyethylene glycol (MIRALAX / GLYCOLAX) 17 g packet Take 17 g by mouth daily as needed for moderate constipation or mild constipation. 02/12/22   Pokhrel, Rebekah Chesterfield, MD  predniSONE (DELTASONE) 1 MG tablet Take by mouth. 05/20/21   [provider]  topiramate (TOPAMAX)  25 MG tablet Take 1 tablet (25 mg total) by mouth 2 (two) times daily. 11/01/22   Raulkar, Drema Pry, MD  traZODone (DESYREL) 50 MG tablet Take by mouth. 07/26/22   [provider]  TRUEPLUS LANCETS 28G MISC Use to check blood sugar daily. 06/22/18   Storm Frisk, MD  Vitamin D, Ergocalciferol, (DRISDOL) 1.25 MG (50000 UNIT) CAPS capsule Take 1 capsule (50,000 Units total) by mouth every 7 (seven) days. 12/26/21   Raulkar, Drema Pry, MD  warfarin (COUMADIN) 2 MG tablet SMARTSIG:1 Tablet(s) By Mouth Every Evening    [provider]  warfarin (COUMADIN) 5 MG tablet Take by mouth. 07/03/22 10/01/22  [provider]      Allergies    Penicillins, Vancomycin, Cefazolin, Penicillin g benzathine, and Sulfa antibiotics    Review of Systems   Review of Systems  Eyes:  Positive for pain.  All other systems reviewed and are negative.   Physical Exam Updated Vital Signs BP (!) 145/84 (BP Location: Right Arm)   Pulse 97   Temp 98 F (36.7 C)   Resp 20   Ht 5\' 6"  (1.676 m)   Wt 112.9 kg   SpO2 96%   BMI 40.19 kg/m  Physical Exam Vitals and nursing note reviewed.  Constitutional:      Appearance: Normal appearance.  HENT:     Head: Normocephalic and atraumatic.  Eyes:     General:        Right eye: Hordeolum present.     Extraocular Movements: Extraocular movements intact.     Conjunctiva/sclera: Conjunctivae normal.     Pupils: Pupils are equal, round, and reactive to light.      Comments: Area of edema, erythema, and scant bloody/watery drainage. Small opening of the skin on the right lower lid water line. PERRLA, EOMI. Bruising noted under the right eye.   Pulmonary:     Effort: Pulmonary effort is normal. No respiratory distress.  Skin:    General: Skin is warm and dry.  Neurological:     Mental Status: She is alert.  Psychiatric:        Mood and Affect: Mood normal.        Behavior: Behavior normal.     ED Results / Procedures / Treatments   Labs (all labs ordered are listed, but only abnormal results are displayed) Labs Reviewed - No data to display  EKG None  Radiology No results found.  Procedures Procedures    Medications Ordered in ED Medications - No data to display  ED Course/ Medical Decision Making/ A&P                             Medical Decision Making  This patient is a 62 y.o. female who presents to the ED for concern of right eye irritation x  4 days.   Differential diagnoses prior to evaluation: Stye, orbital cellulitis, acute angle  closure glaucoma  Past Medical History / Social History / Additional history: Chart reviewed. Pertinent results include: Hypertension, diabetes, DVT, rheumatoid arthritis, fibromyalgia, diastolic heart failure  Physical Exam: Physical exam performed. The pertinent findings include: Right lower eyelid irritation with small area of edema, erythema, and scant bloody/watery discharge with small opening of the skin on the right lower lid water line.  Normal EOM, without pain.  Some bruising noted under the right eye.  Normal conjunctive.  Disposition: After consideration of the diagnostic results and the  patients response to treatment, I feel that emergency department workup does not suggest an emergent condition requiring admission or immediate intervention beyond what has been performed at this time. The plan is: Discharged home with symptomatic treatment of likely opened hordeolum.  Will prescribe antibiotic ointment to prevent infection as there is an open wound.  No evidence of surrounding cellulitis.  Encourage close follow-up with eye doctor, and patient plans to call them. The patient is safe for discharge and has been instructed to return immediately for worsening symptoms, change in symptoms or any other concerns.  Final Clinical Impression(s) / ED Diagnoses Final diagnoses:  Hordeolum externum of right lower eyelid    Rx / DC Orders ED Discharge Orders          Ordered    erythromycin ophthalmic ointment        11/04/22 1705           Portions of this report may have been transcribed using voice recognition software. Every effort was made to ensure accuracy; however, inadvertent computerized transcription errors may be present.    Su Monks, PA-C 11/04/22 1711    Virgina Norfolk, DO 11/04/22 2247

## 2022-11-04 NOTE — ED Triage Notes (Signed)
Pt states stye under right eye that started on Thursday  States now more painful and bleeding

## 2022-11-04 NOTE — Discharge Instructions (Addendum)
You are seen emergency department today for an eye problem.  As we discussed, I think your symptoms are related to a stye that opened.  These can rarely have some bloody drainage.  I am giving you some antibiotic eye ointment to help prevent any infection since this is an open area.  Please give your eye doctor a call and schedule follow-up appointment.  Make sure that you are practicing good hand hygiene to also prevent infection.  Continue to monitor how you're doing and return to the ER for new or worsening symptoms.

## 2022-11-28 ENCOUNTER — Encounter: Payer: No Typology Code available for payment source | Admitting: Physical Medicine and Rehabilitation

## 2022-12-17 ENCOUNTER — Encounter: Payer: No Typology Code available for payment source | Admitting: Physical Medicine and Rehabilitation

## 2023-01-19 ENCOUNTER — Emergency Department (HOSPITAL_BASED_OUTPATIENT_CLINIC_OR_DEPARTMENT_OTHER)
Admission: EM | Admit: 2023-01-19 | Discharge: 2023-01-19 | Disposition: A | Discharge status: Home / Self Care | Payer: No Typology Code available for payment source | Attending: Emergency Medicine | Admitting: Emergency Medicine

## 2023-01-19 ENCOUNTER — Other Ambulatory Visit: Payer: Self-pay

## 2023-01-19 ENCOUNTER — Emergency Department (HOSPITAL_BASED_OUTPATIENT_CLINIC_OR_DEPARTMENT_OTHER): Payer: No Typology Code available for payment source

## 2023-01-19 ENCOUNTER — Encounter (HOSPITAL_BASED_OUTPATIENT_CLINIC_OR_DEPARTMENT_OTHER): Payer: Self-pay | Admitting: Emergency Medicine

## 2023-01-19 DIAGNOSIS — N179 Acute kidney failure, unspecified: Secondary | ICD-10-CM

## 2023-01-19 DIAGNOSIS — N132 Hydronephrosis with renal and ureteral calculous obstruction: Secondary | ICD-10-CM | POA: Insufficient documentation

## 2023-01-19 DIAGNOSIS — Z7901 Long term (current) use of anticoagulants: Secondary | ICD-10-CM | POA: Insufficient documentation

## 2023-01-19 DIAGNOSIS — N201 Calculus of ureter: Secondary | ICD-10-CM

## 2023-01-19 DIAGNOSIS — R300 Dysuria: Secondary | ICD-10-CM | POA: Diagnosis present

## 2023-01-19 LAB — COMPREHENSIVE METABOLIC PANEL
ALT: 10 U/L (ref 0–44)
AST: 17 U/L (ref 15–41)
Albumin: 3 g/dL — ABNORMAL LOW (ref 3.5–5.0)
Alkaline Phosphatase: 120 U/L (ref 38–126)
Anion gap: 8 (ref 5–15)
BUN: 25 mg/dL — ABNORMAL HIGH (ref 8–23)
CO2: 21 mmol/L — ABNORMAL LOW (ref 22–32)
Calcium: 8.2 mg/dL — ABNORMAL LOW (ref 8.9–10.3)
Chloride: 106 mmol/L (ref 98–111)
Creatinine, Ser: 3.12 mg/dL — ABNORMAL HIGH (ref 0.44–1.00)
GFR, Estimated: 16 mL/min — ABNORMAL LOW (ref >60)
Glucose, Bld: 100 mg/dL — ABNORMAL HIGH (ref 70–99)
Potassium: 4.3 mmol/L (ref 3.5–5.1)
Sodium: 135 mmol/L (ref 135–145)
Total Bilirubin: 0.4 mg/dL (ref 0.3–1.2)
Total Protein: 6.3 g/dL — ABNORMAL LOW (ref 6.5–8.1)

## 2023-01-19 LAB — URINALYSIS, ROUTINE W REFLEX MICROSCOPIC
Bilirubin Urine: NEGATIVE
Glucose, UA: NEGATIVE mg/dL
Ketones, ur: NEGATIVE mg/dL
Nitrite: NEGATIVE
Protein, ur: NEGATIVE mg/dL
Specific Gravity, Urine: 1.01 (ref 1.005–1.030)
pH: 6.5 (ref 5.0–8.0)

## 2023-01-19 LAB — CBC WITH DIFFERENTIAL/PLATELET
Abs Immature Granulocytes: 0.01 10*3/uL (ref 0.00–0.07)
Basophils Absolute: 0 10*3/uL (ref 0.0–0.1)
Basophils Relative: 0 %
Eosinophils Absolute: 0.2 10*3/uL (ref 0.0–0.5)
Eosinophils Relative: 3 %
HCT: 33.3 % — ABNORMAL LOW (ref 36.0–46.0)
Hemoglobin: 10.8 g/dL — ABNORMAL LOW (ref 12.0–15.0)
Immature Granulocytes: 0 %
Lymphocytes Relative: 20 %
Lymphs Abs: 1.1 10*3/uL (ref 0.7–4.0)
MCH: 31.3 pg (ref 26.0–34.0)
MCHC: 32.4 g/dL (ref 30.0–36.0)
MCV: 96.5 fL (ref 80.0–100.0)
Monocytes Absolute: 0.6 10*3/uL (ref 0.1–1.0)
Monocytes Relative: 10 %
Neutro Abs: 3.6 10*3/uL (ref 1.7–7.7)
Neutrophils Relative %: 67 %
Platelets: 314 10*3/uL (ref 150–400)
RBC: 3.45 MIL/uL — ABNORMAL LOW (ref 3.87–5.11)
RDW: 17.4 % — ABNORMAL HIGH (ref 11.5–15.5)
WBC: 5.5 10*3/uL (ref 4.0–10.5)
nRBC: 0 % (ref 0.0–0.2)

## 2023-01-19 LAB — PROTIME-INR
INR: 2.3 — ABNORMAL HIGH (ref 0.8–1.2)
Prothrombin Time: 25.8 seconds — ABNORMAL HIGH (ref 11.4–15.2)

## 2023-01-19 LAB — URINALYSIS, MICROSCOPIC (REFLEX)

## 2023-01-19 LAB — LIPASE, BLOOD: Lipase: 26 U/L (ref 11–51)

## 2023-01-19 MED ORDER — SODIUM CHLORIDE 0.9 % IV BOLUS
1000.0000 mL | Freq: Once | INTRAVENOUS | Status: AC
  Administered 2023-01-19: 1000 mL via INTRAVENOUS

## 2023-01-19 NOTE — ED Notes (Signed)
BLU, Lt GRN, LAV tubes redrawn

## 2023-01-19 NOTE — ED Provider Notes (Addendum)
[place] EM[facility] AT [facility] Provider Note   CSN: [id number] Arrival date & time: 01/19/23  1712     History  Chief Complaint  Patient presents with   Fatigue   Dysuria    Brittany Tran is a 62 y.o. female.  62 year old female with history of solitary kidney presents today for concern of feeling sluggish, and dysuria.  She reports drinking plenty of fluids.  Does not have history of CHF and does not take diuretics.  Takes Coumadin for rheumatoid arthritis complication.  Denies history of A-fib or blood clots.  The history is provided by the patient. No language interpreter was used.       Home Medications Prior to Admission medications   Medication Sig Start Date End Date Taking? Authorizing Provider  acetaminophen (TYLENOL) 325 MG tablet Take 2 tablets (650 mg total) by mouth every 6 (six) hours as needed for mild pain or fever. 05/21/18   Shon Hale, MD  Docusate Sodium (DSS) 100 MG CAPS Take by mouth. 03/07/22   [provider]  DULoxetine (CYMBALTA) 20 MG capsule Take 1 capsule (20 mg total) by mouth daily. 09/03/22   Raulkar, Drema Pry, MD  enoxaparin (LOVENOX) 120 MG/0.8ML injection Inject 0.8 mLs (120 mg total) into the skin every 12 (twelve) hours for 7 days. 03/15/22 03/22/22  Melene Plan, DO  erythromycin ophthalmic ointment Place a 1/2 inch ribbon of ointment into the lower eyelid. 11/04/22   Roemhildt, Lorin T, PA-C  feeding supplement (ENSURE ENLIVE / ENSURE PLUS) LIQD Take 237 mLs by mouth 2 (two) times daily between meals. 02/12/22   Pokhrel, Rebekah Chesterfield, MD  folic acid (FOLVITE) 1 MG tablet Take by mouth.    [provider]  furosemide (LASIX) 20 MG tablet Take 1 tablet (20 mg total) by mouth daily. 02/14/22 02/14/23  Pokhrel, Rebekah Chesterfield, MD  gabapentin (NEURONTIN) 300 MG capsule Take 1 capsule (300 mg total) by mouth 3 (three) times daily. 11/01/22   Raulkar, Drema Pry, MD  glucose blood (TRUE METRIX BLOOD GLUCOSE TEST) test  strip Use as instructed 06/22/18   Storm Frisk, MD  hydrocerin (EUCERIN) CREA Apply 1 Application topically 2 (two) times daily. To dry skin and areas of scaling 02/12/22   Pokhrel, Laxman, MD  methocarbamol (ROBAXIN) 500 MG tablet TAKE 1 TABLET(500 MG) BY MOUTH DAILY AS NEEDED FOR MUSCLE SPASMS 09/30/22   Raulkar, Drema Pry, MD  methotrexate (RHEUMATREX) 2.5 MG tablet Take by mouth. 07/05/19   [provider]  Multiple Vitamin (MULTIVITAMIN WITH MINERALS) TABS tablet Take 1 tablet by mouth daily. 02/13/22   Pokhrel, Rebekah Chesterfield, MD  nystatin (MYCOSTATIN/NYSTOP) powder Apply topically 2 (two) times daily. Apply to intertriginous area under panniculus 02/12/22   Pokhrel, Laxman, MD  ondansetron (ZOFRAN-ODT) 4 MG disintegrating tablet Take 4 mg by mouth every 6 (six) hours as needed. 04/23/22   [provider]  oxyCODONE (OXY IR/ROXICODONE) 5 MG immediate release tablet Take 1 tablet (5 mg total) by mouth 2 (two) times daily as needed for severe pain. 09/03/22   Raulkar, Drema Pry, MD  polyethylene glycol (MIRALAX / GLYCOLAX) 17 g packet Take 17 g by mouth daily as needed for moderate constipation or mild constipation. 02/12/22   Pokhrel, Rebekah Chesterfield, MD  predniSONE (DELTASONE) 1 MG tablet Take by mouth. 05/20/21   [provider]  topiramate (TOPAMAX) 25 MG tablet Take 1 tablet (25 mg total) by mouth 2 (two) times daily. 11/01/22   Raulkar, Drema Pry, MD  traZODone (  DESYREL) 50 MG tablet Take by mouth. 07/26/22   [provider]  TRUEPLUS LANCETS 28G MISC Use to check blood sugar daily. 06/22/18   Storm Frisk, MD  Vitamin D, Ergocalciferol, (DRISDOL) 1.25 MG (50000 UNIT) CAPS capsule Take 1 capsule (50,000 Units total) by mouth every 7 (seven) days. 12/26/21   Raulkar, Drema Pry, MD  warfarin (COUMADIN) 2 MG tablet SMARTSIG:1 Tablet(s) By Mouth Every Evening    [provider]  warfarin (COUMADIN) 5 MG tablet Take by mouth. 07/03/22 10/01/22  [provider]       Allergies    Penicillins, Vancomycin, Cefazolin, Penicillin g benzathine, and Sulfa antibiotics    Review of Systems   Review of Systems  Constitutional:  Negative for chills and fever.  Cardiovascular:  Negative for palpitations.  Gastrointestinal:  Negative for abdominal pain, nausea and vomiting.  Genitourinary:  Positive for dysuria and frequency.  All other systems reviewed and are negative.   Physical Exam Updated Vital Signs BP 138/70   Pulse 76   Temp 98.5 F (36.9 C) (Oral)   Resp 15   Ht 5\' 6"  (1.676 m)   Wt 107.5 kg   SpO2 99%   BMI 38.25 kg/m  Physical Exam Vitals and nursing note reviewed.  Constitutional:      General: She is not in acute distress.    Appearance: Normal appearance. She is not ill-appearing.  HENT:     Head: Normocephalic and atraumatic.     Nose: Nose normal.  Eyes:     General: No scleral icterus.    Extraocular Movements: Extraocular movements intact.     Conjunctiva/sclera: Conjunctivae normal.  Cardiovascular:     Rate and Rhythm: Normal rate and regular rhythm.     Heart sounds: Normal heart sounds.  Pulmonary:     Effort: Pulmonary effort is normal. No respiratory distress.     Breath sounds: Normal breath sounds. No wheezing or rales.  Abdominal:     General: There is no distension.     Tenderness: There is no abdominal tenderness.  Musculoskeletal:        General: Normal range of motion.     Cervical back: Normal range of motion.  Skin:    General: Skin is warm and dry.  Neurological:     General: No focal deficit present.     Mental Status: She is alert. Mental status is at baseline.     ED Results / Procedures / Treatments   Labs (all labs ordered are listed, but only abnormal results are displayed) Labs Reviewed  CBC WITH DIFFERENTIAL/PLATELET - Abnormal; Notable for the following components:      Result Value   RBC 3.45 (*)    Hemoglobin 10.8 (*)    HCT 33.3 (*)    RDW 17.4 (*)    All other components  within normal limits  COMPREHENSIVE METABOLIC PANEL - Abnormal; Notable for the following components:   CO2 21 (*)    Glucose, Bld 100 (*)    BUN 25 (*)    Creatinine, Ser 3.12 (*)    Calcium 8.2 (*)    Total Protein 6.3 (*)    Albumin 3.0 (*)    GFR, Estimated 16 (*)    All other components within normal limits  URINALYSIS, ROUTINE W REFLEX MICROSCOPIC    EKG None  Radiology No results found.  Procedures .Critical Care  Performed by: Marita Kansas, PA-C Authorized by: Marita Kansas, PA-C   Critical care provider statement:  Critical care time (minutes):  31   Critical care was necessary to treat or prevent imminent or life-threatening deterioration of the following conditions:  Renal failure   Critical care was time spent personally by me on the following activities:  Development of treatment plan with patient or surrogate, discussions with consultants, evaluation of patient's response to treatment, examination of patient, ordering and review of laboratory studies, ordering and review of radiographic studies, ordering and performing treatments and interventions, pulse oximetry, re-evaluation of patient's condition and review of old charts     Medications Ordered in ED Medications  sodium chloride 0.9 % bolus 1,000 mL (1,000 mLs Intravenous New Bag/Given 01/19/23 1835)    ED Course/ Medical Decision Making/ A&P                                 Medical Decision Making Amount and/or Complexity of Data Reviewed Labs: ordered. Radiology: ordered.   62 year old female with history of solitary kidney presents today for concern of left flank pain, dysuria, and feeling sluggish.  Her labs show that she has an AKI at 3.12.  This is new from baseline of 0.6.  She is status post nephrectomy.  This was done at [facility].  Reportedly this was done in May.  I was able to review under Care Everywhere her recent visit with Dr. Mickey Farber.  At that time she was doing relatively well and  her postop course was going as expected.  She was due to follow-up in 3 months.   She is without leukocytosis, normal electrolytes.  LFTs are normal.  CT imaging reveals an obstructive stone on the left.  On the right it reveals a fluid collection which per radiologist read is concerning for seroma/hematoma versus an abscess.  There is also some pericholecystic fluid surrounding the gallbladder but believed to be from the fluid collection surrounding the nephrectomy site.  She is afebrile, without tachycardia and without leukocytosis.  Low suspicion that this is due to infectious process.  However we will discuss these findings as well as the acute obstructing stone on the left side with urologist from [facility].  Discussed with Dr. Domingo Pulse from [facility].  He is not concerned regarding the fluid collection in the right side he states that is from a hemostatic agent used during surgery.  He would like patient transferred ED to ED with the obstructing stone to place stent.  Patient is agreeable.  Secretary notified to work on Media pl[employer].  UA without evidence of UTI.  Patient states pain is controlled and does not want pain medicine at this time.  Final Clinical Impression(s) / ED Diagnoses Final diagnoses:  Ureteral stone  AKI (acute kidney injury) [facility])    Rx / DC Orders ED Discharge Orders     None         Marita Kansas, PA-C 01/19/23 2107    Marita Kansas, PA-C 01/19/23 2208    Virgina Norfolk, DO 01/19/23 2303

## 2023-01-19 NOTE — ED Triage Notes (Signed)
Reports feeling "sluggish" since yesterday. Also endorses urinary urgency and concerned for UTI.

## 2023-01-19 NOTE — ED Notes (Signed)
Pt had an accident and given new brief and spray to freshen up in the bathroom. New chucks pad placed on the stretcher at this time. Pt able to ambulate on her own to the bathroom using her cane.

## 2023-01-24 ENCOUNTER — Encounter: Payer: Self-pay | Admitting: Physical Medicine and Rehabilitation

## 2023-01-24 ENCOUNTER — Encounter
Payer: No Typology Code available for payment source | Attending: Registered Nurse | Admitting: Physical Medicine and Rehabilitation

## 2023-01-24 VITALS — BP 112/78 | HR 80 | Ht 66.0 in | Wt 231.8 lb

## 2023-01-24 DIAGNOSIS — F411 Generalized anxiety disorder: Secondary | ICD-10-CM | POA: Diagnosis present

## 2023-01-24 DIAGNOSIS — Z5181 Encounter for therapeutic drug level monitoring: Secondary | ICD-10-CM

## 2023-01-24 DIAGNOSIS — M797 Fibromyalgia: Secondary | ICD-10-CM

## 2023-01-24 DIAGNOSIS — M5430 Sciatica, unspecified side: Secondary | ICD-10-CM

## 2023-01-24 DIAGNOSIS — C649 Malignant neoplasm of unspecified kidney, except renal pelvis: Secondary | ICD-10-CM | POA: Diagnosis not present

## 2023-01-24 DIAGNOSIS — G894 Chronic pain syndrome: Secondary | ICD-10-CM | POA: Diagnosis present

## 2023-01-24 DIAGNOSIS — N200 Calculus of kidney: Secondary | ICD-10-CM | POA: Diagnosis present

## 2023-01-24 DIAGNOSIS — Z79899 Other long term (current) drug therapy: Secondary | ICD-10-CM | POA: Diagnosis present

## 2023-01-24 DIAGNOSIS — M5412 Radiculopathy, cervical region: Secondary | ICD-10-CM

## 2023-01-24 MED ORDER — OXYCODONE-ACETAMINOPHEN 5-325 MG PO TABS: 1.0000 | ORAL_TABLET | Freq: Two times a day (BID) | ORAL | 0 refills | Status: DC | PRN

## 2023-01-24 NOTE — Patient Instructions (Signed)
Anxiety: -Discussed exercise and meditation as tools to decrease anxiety. -Recommended Down Dog Yoga app -Discussed spending time outdoors. -Discussed positive re-framing of anxiety.  -Discussed the following foods that have been show to reduce anxiety: 1) [place] nuts, mushrooms, soy beans due to their high selenium content. Upper limit of toxicity of selenium is 435mg/day so no more than 3-4 b[place]oliv[place]uts per day.  2) Fatty fish such as salmon, mackerel, sardines, trout, and herring- high in omega-3 fatty acids 3) Eggs- increases serotonin and dopamine 4) Pumpkin seeds- high in omega-3 fatty acids 5) dark chocolate- high in flavanols that increase blood flow to brain 6) turmeric- take with black pepper to increase absorption 7) chamomile tea- antioxidant and anti-inflammatory properties 8) yogurt without sugar- supports gut-brain axis 9) green tea- contains L- theanine 10) blueberries- high in vitamin C and antioxidants 11) [place] high in tryptophan which gets converted to serotonin 12) bell peppers- rich in vitamin C and antioxidants 13) citrus fruits- rich in vitamin C and antioxidants 14) almonds- high in vitamin E and healthy fats 15) chia seeds- high in omega-3 fatty acids

## 2023-01-24 NOTE — Progress Notes (Signed)
Subjective:    Patient ID: Tonishia Berdine, female    DOB: Sep 14, 1960, 62 y.o.   MRN: [id number]  HPI Mrs. Biber is a 62 year old woman who presents for follow-up of chronic pain.  1) Chronic pain: -in pain right now, muscles in arms are hurting.  -she is out of topiramate -she usually takes Percocet BID PRN for her rheumatoid arthritis pain and fibromyalgia.  -the rain worsens her pain -last visit we tried her on Topamax and this did not help her pain but it did help her to sleep well at night and to lose weight.  -she would like to restart the Percocet but is unable to com in to the clinic right now due to her work.  -her shoulder and knee pain is worst -her rheumatologist does not want to prescribe opioids -has not tried Lyrica, Cymbalta, Elavil, or Savella -scared of water.  -due for swab today  2) Restless leg syndrome: -not taking any sleep medicine right now  3) Tingling in thumb: -started when she got a blood draw -radiates up to her neck on her left side -she went to the urgent care center on Friday and they did an ultrasound to check for blood clots in her lung.  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 General activity 8 Relation with others 0 Enjoyment of life 0 What TIME of day is your pain at its worst? morning  Sleep (in general) Fair  Pain is worse with: walking Pain improves with: medication Relief from Meds: 8     Family History  Problem Relation Age of Onset   Diabetes Mother    Social History    Socioeconomic History   Marital status: Single    Spouse name: Not on file   Number of children: Not on file   Years of education: Not on file   Highest education level: Not on file  Occupational History   Occupation: unemployed  Tobacco Use   Smoking status: Never   Smokeless tobacco: Never  Vaping Use   Vaping status: Never Used  Substance and Sexual Activity   Alcohol use: No   Drug use: No   Sexual activity: Not on file  Other Topics Concern   Not on file  Social History Narrative   Not on file   Social Determinants of Health   Financial Resource Strain: Not on file  Food Insecurity: Low Risk  (11/06/2022)   Received from [facility]   Food vital sign    Within the past 12 months, you worried that your food would run out before you got money to buy more: Never true    Within the past 12 months, the food you bought just didn't last and you didn't have money to get more. : Never true  Transportation Needs: Not on file (11/06/2022)  Physical Activity: Not on file  Stress: Not on file  Social Connections: Not on file   Past Surgical History:  Procedure Laterality Date   HARDWARE REMOVAL Left  09/29/2014   Procedure: REMOVAL OF DEEP IMPLANT OF LEFT FIBULA  AND TIBIA (SEPARATE INCISIONS);  Surgeon: Toni Arthurs, MD;  Location: [facility];  Service: Orthopedics;  Laterality: Left;   ORIF ANKLE FRACTURE  1988   left   Past Medical History:  Diagnosis Date   Cellulitis    Diabetes mellitus without complication (HCC)    Diastolic heart failure (HCC)    Echo 03/14/15 Grade I DD   Fibromyalgia    History of DVT (deep vein thrombosis)    Hypertension    Rheumatoid arthritis (HCC)    Sepsis (HCC) 12/2019   There were no vitals taken for this visit.  Opioid Risk Score:   Fall Risk Score:  `1  Depression screen [facility] 2/9     11/01/2022    8:47 AM 10/03/2022   10:10 AM 09/03/2022    9:34 AM 08/06/2022    9:40 AM 04/10/2022   11:54 AM 01/09/2022    1:41  PM 12/04/2021    9:45 AM  Depression screen PHQ 2/9  Decreased Interest 0 1 0 0 0 1 1  Down, Depressed, Hopeless 0 1 0 0 0 1 1  PHQ - 2 Score 0 2 0 0 0 2 2  Altered sleeping       1  Tired, decreased energy       1  Change in appetite       1  Feeling bad or failure about yourself        1  Trouble concentrating       1  Moving slowly or fidgety/restless       0  Suicidal thoughts       0  PHQ-9 Score       7    Review of Systems  Constitutional: Negative.   HENT: Negative.    Eyes: Negative.   Respiratory: Negative.    Cardiovascular: Negative.   Gastrointestinal: Negative.   Endocrine: Negative.   Genitourinary: Negative.   Musculoskeletal:  Positive for back pain and gait problem.       Bilateral arms and left leg  Skin: Negative.   Allergic/Immunologic: Negative.   Hematological:  Bruises/bleeds easily.       Warfarin  Psychiatric/Behavioral: Negative.    All other systems reviewed and are negative.      Objective:   Physical Exam Gen: no distress, normal appearing, 231 lbs, BP 122/78, BMI 37.41 HEENT: oral mucosa pink and moist, NCAT Cardio: Reg rate Chest: normal effort, normal rate of breathing Abd: soft, non-distended Ext: no edema Psych: pleasant, normal affect Skin: intact Neuro/MSK: trigger points palpable in upper extremities, negative Spurling's test    Assessment & Plan:   1) Fibromyalgia is a clinical syndrome characterized by widespread pain and tenderness in addition to a variety of symptoms, including fatigue, anxiety, depression, and sleep disturbances. Fibromyalgia affects 3-5% of women and up to 25% of women with other rheumatological conditions.   Exercise is a first-line treatment for the disease, and can help with both the physical and emotional symptoms, as well as improving overall health and function.    -Provided with a pain relief journal and discussed that it contains foods and lifestyle tips to naturally help to improve pain.  Discussed that these lifestyle strategies are also very good for health unlike some medications which can have negative side effects. Discussed that the act of keeping a journal can be therapeutic and helpful to realize patterns what helps to trigger and  alleviate pain.    -cymbalta 20mg  daily prescribed  Due for swab today  2) Sciatic nerve pain: -d/c topamax because of kidney stone -discussed that this pain has been severe -will send staff message to Cordelia Pen to have her come in for appointment with Riley Lam to sign pain contract and obtain urine sample since the topamax is not helping enough with her pain, she does not want to try additional nerve agents as is afraid of their side effect profile, she would like to restart her Percocet since she tolerated this well in the past and takes no more than two 5mg  percocet per day, provided refill -cymbalta 20mg  daily prn.  -discussed mechanism of action of low dose naltrexone as an opioid receptor antagonist which stimulates your body's production of its own natural endogenous opioids, helping to decrease pain. Discussed that it can also decrease T cell response and thus be helpful in decreasing inflammation, and symptoms of brain fog, fatigue, anxiety, depression, and allergies. Discussed that this medication needs to be compounded at a compounding pharmacy and can more expensive. Discussed that I usually start at 1mg  and if this is not providing enough relief then I titrate upward on a monthly basis.    3) Cervical radiculitis: D/c gabapentin as it causes tremor -discussed XR. -recommended heating pad.  -Discussed current symptoms of pain and history of pain.  -Discussed benefits of exercise in reducing pain. -Discussed following foods that may reduce pain: 1) Ginger (especially studied for arthritis)- reduce leukotriene production to decrease inflammation 2) Blueberries- high in phytonutrients that decrease inflammation 3) Salmon- marine omega-3s  reduce joint swelling and pain 4) Pumpkin seeds- reduce inflammation 5) dark chocolate- reduces inflammation 6) turmeric- reduces inflammation 7) tart cherries - reduce pain and stiffness 8) extra virgin olive oil - its compound olecanthal helps to block prostaglandins  9) chili peppers- can be eaten or applied topically via capsaicin 10) mint- helpful for headache, muscle aches, joint pain, and itching 11) garlic- reduces inflammation  Link to further information on diet for chronic pain: [email or web address]   4) Kidney mass: -discussed her cancerous mass and its removal  5) Insomnia: -Try to go outside near sunrise -Get exercise during the day.  -Turn off all devices an hour before bedtime.  -Teas that can benefit: chamomile, valerian root, Brahmi (Bacopa) -Can consider over the counter melatonin, magnesium, and/or L-theanine. Melatonin is an anti-oxidant with multiple health benefits. Magnesium is involved in greater than 300 enzymatic reactions in the body and most of [place] are deficient as our soil is often depleted. There are 7 different types of magnesium- Bioptemizer's is a supplement with all 7 types, and each has unique benefits. Magnesium can also help with constipation and anxiety.  -Pistachios naturally increase the production of melatonin -Cozy Earth bamboo bed sheets are free from toxic chemicals.  -Tart cherry juice or a tart cherry supplement can improve sleep and soreness post-workout  6) Kidney stone -discussed that she was unable to use the bathroom due to this -discussed that she has a procedure scheduled to break tup the kidney stone  7) Cervical myofascial pain: -discussed that trigger points only provided 1 week of relief  8) Anxiety:  -discussed ativan increases the risk of dementia -Discussed exercise and meditation as tools to decrease anxiety. -Recommended Down Dog Yoga app -Discussed  spending time outdoors. -Discussed positive re-framing of anxiety.  -Discussed the following foods that have been show to reduce anxiety: 1) [place] nuts, mushrooms, soy beans due to their high  selenium content. Upper limit of toxicity of selenium is 468mcg/day so no more than 3-4 [place] nuts per day.  2) Fatty fish such as salmon, mackerel, sardines, trout, and herring- high in omega-3 fatty acids 3) Eggs- increases serotonin and dopamine 4) Pumpkin seeds- high in omega-3 fatty acids 5) dark chocolate- high in flavanols that increase blood flow to brain 6) turmeric- take with black pepper to increase absorption 7) chamomile tea- antioxidant and anti-inflammatory properties 8) yogurt without sugar- supports gut-brain axis 9) green tea- contains L- theanine 10) blueberries- high in vitamin C and antioxidants 11) [place]- high in tryptophan which gets converted to serotonin 12) bell peppers- rich in vitamin C and antioxidants 13) citrus fruits- rich in vitamin C and antioxidants 14) almonds- high in vitamin E and healthy fats 15) chia seeds- high in omega-3 fatty acids

## 2023-02-03 ENCOUNTER — Ambulatory Visit: Payer: No Typology Code available for payment source | Admitting: Physical Medicine and Rehabilitation

## 2023-02-19 ENCOUNTER — Encounter: Payer: No Typology Code available for payment source | Attending: Registered Nurse | Admitting: Registered Nurse

## 2023-02-19 ENCOUNTER — Encounter: Payer: Self-pay | Admitting: Registered Nurse

## 2023-02-19 VITALS — BP 116/76 | HR 78 | Ht 66.0 in | Wt 236.0 lb

## 2023-02-19 DIAGNOSIS — M7918 Myalgia, other site: Secondary | ICD-10-CM | POA: Diagnosis present

## 2023-02-19 DIAGNOSIS — M25511 Pain in right shoulder: Secondary | ICD-10-CM | POA: Insufficient documentation

## 2023-02-19 DIAGNOSIS — Z79899 Other long term (current) drug therapy: Secondary | ICD-10-CM | POA: Diagnosis present

## 2023-02-19 DIAGNOSIS — M5416 Radiculopathy, lumbar region: Secondary | ICD-10-CM | POA: Diagnosis not present

## 2023-02-19 DIAGNOSIS — G8929 Other chronic pain: Secondary | ICD-10-CM | POA: Diagnosis present

## 2023-02-19 DIAGNOSIS — M797 Fibromyalgia: Secondary | ICD-10-CM

## 2023-02-19 DIAGNOSIS — M542 Cervicalgia: Secondary | ICD-10-CM | POA: Diagnosis present

## 2023-02-19 DIAGNOSIS — G894 Chronic pain syndrome: Secondary | ICD-10-CM

## 2023-02-19 DIAGNOSIS — M5412 Radiculopathy, cervical region: Secondary | ICD-10-CM | POA: Diagnosis present

## 2023-02-19 DIAGNOSIS — Z5181 Encounter for therapeutic drug level monitoring: Secondary | ICD-10-CM | POA: Diagnosis present

## 2023-02-19 DIAGNOSIS — M546 Pain in thoracic spine: Secondary | ICD-10-CM | POA: Insufficient documentation

## 2023-02-19 DIAGNOSIS — M25512 Pain in left shoulder: Secondary | ICD-10-CM | POA: Diagnosis present

## 2023-02-19 MED ORDER — OXYCODONE-ACETAMINOPHEN 5-325 MG PO TABS: 1.0000 | ORAL_TABLET | Freq: Two times a day (BID) | ORAL | 0 refills | Status: DC | PRN

## 2023-02-19 NOTE — Progress Notes (Signed)
Subjective:    Patient ID: Brittany Tran, female    DOB: 01-May-1961, 62 y.o.   MRN: [id number]  HPI: Brittany Tran is a 62 y.o. female who returns for follow up appointment for chronic pain and medication refill. She states her pain is located in her  neck radiating into her bilateral shoulders, upper- lower back pain. She rates her pain 5. Her current exercise regime is walking and performing stretching exercises.  Brittany Tran asked about switching to Lyrica, this was discussed with Dr Marijean Niemann, she was in agreement. Ms. Christner changed her mind and decided to continue with Gabapentin. She was instructed to call or send a My- Chart message if she changes her mind, she verbalizes understanding.   Ms. Gally Morphine equivalent is 15.00 MME.   Last Oral Swab was Performed on 01/24/2023, it was consistent.    Pain Inventory Average Pain 5 Pain Right Now 5 My pain is constant  In the last 24 hours, has pain interfered with the following? General activity 5 Relation with others 5 Enjoyment of life 5 What TIME of day is your pain at its worst? morning , evening, and night Sleep (in general) Fair  Pain is worse with: bending, sitting, and inactivity Pain improves with: medication Relief from Meds: 5  Family History  Problem Relation Age of Onset   Diabetes Mother    Social History   Socioeconomic History   Marital status: Single    Spouse name: Not on file   Number of children: Not on file   Years of education: Not on file   Highest education level: Not on file  Occupational History   Occupation: unemployed  Tobacco Use   Smoking status: Never   Smokeless tobacco: Never  Vaping Use   Vaping status: Never Used  Substance and Sexual Activity   Alcohol use: No   Drug use: No   Sexual activity: Not on file  Other Topics Concern   Not on file  Social History Narrative   Not on file   Social Determinants of Health   Financial Resource Strain: Not on file  Food  Insecurity: Low Risk  (01/31/2023)   Received from [facility]   Hunger Vital Sign    Worried About Running Out of Food in the Last Year: Never true    Ran Out of Food in the Last Year: Never true  Transportation Needs: Not on file (01/31/2023)  Physical Activity: Not on file  Stress: Not on file  Social Connections: Not on file   Past Surgical History:  Procedure Laterality Date   HARDWARE REMOVAL Left 09/29/2014   Procedure: REMOVAL OF DEEP IMPLANT OF LEFT FIBULA  AND TIBIA (SEPARATE INCISIONS);  Surgeon: Toni Arthurs, MD;  Location: [facility];  Service: Orthopedics;  Laterality: Left;   ORIF ANKLE FRACTURE  1988   left   Past Surgical History:  Procedure Laterality Date   HARDWARE REMOVAL Left 09/29/2014   Procedure: REMOVAL OF DEEP IMPLANT OF LEFT FIBULA  AND TIBIA (SEPARATE INCISIONS);  Surgeon: Toni Arthurs, MD;  Location: [facility];  Service: Orthopedics;  Laterality: Left;   ORIF ANKLE FRACTURE  1988   left   Past Medical History:  Diagnosis Date   Cellulitis    Diabetes mellitus without complication (HCC)    Diastolic heart failure (HCC)    Echo 03/14/15 Grade I DD   Fibromyalgia    History of DVT (deep vein thrombosis)    Hypertension  Rheumatoid arthritis (HCC)    Sepsis (HCC) 12/2019   BP 116/76   Pulse 78   Ht 5\' 6"  (1.676 m)   Wt 236 lb (107 kg)   SpO2 98%   BMI 38.09 kg/m   Opioid Risk Score:   Fall Risk Score:  `1  Depression screen [facility] 2/9     01/24/2023   10:05 AM 11/01/2022    8:47 AM 10/03/2022   10:10 AM 09/03/2022    9:34 AM 08/06/2022    9:40 AM 04/10/2022   11:54 AM 01/09/2022    1:41 PM  Depression screen PHQ 2/9  Decreased Interest 0 0 1 0 0 0 1  Down, Depressed, Hopeless 0 0 1 0 0 0 1  PHQ - 2 Score 0 0 2 0 0 0 2      Review of Systems  Musculoskeletal:        B/l shoulder knee pain  All other systems reviewed and are negative.      Objective:   Physical Exam Vitals and nursing note reviewed.   Constitutional:      Appearance: Normal appearance.  Cardiovascular:     Rate and Rhythm: Normal rate and regular rhythm.     Pulses: Normal pulses.     Heart sounds: Normal heart sounds.  Pulmonary:     Effort: Pulmonary effort is normal.     Breath sounds: Normal breath sounds.  Musculoskeletal:     Cervical back: Normal range of motion and neck supple.     Comments: Normal Muscle Bulk and Muscle Testing Reveals:  Upper Extremities: Right: Decreased ROM 45 Degrees and Muscle Strength 5/5 Left Upper Extremity: Decreased ROM 90 Degrees and Muscle Strength 5/5 Bilateral AC Joint Tenderness Thoracic Paraspinal Tenderness: T-1-T-7 Lumbar Paraspinal Tenderness: L-4-L-5 Lower Extremities: Full ROM and Muscle Strength 5/5 Arises from Table Slowly Antalgic  Gait     Skin:    General: Skin is warm and dry.  Neurological:     Mental Status: She is alert and oriented to person, place, and time.  Psychiatric:        Mood and Affect: Mood normal.        Behavior: Behavior normal.         Assessment & Plan:   Chronic Bilateral Thoracic Back Pain/ Right Lumbar Radiculitis: Continue HEP as tolerated. Continue Gabapentin. Continue to Monitor. 02/19/2023 Fibromyalgia: Continue HEP as tolerated. Continue Gabapentin. Continue to Monitor. 02/19/2023 Rheumatoid Arthritis: Rheumatology Following. 02/19/2023 Chronic Pain Syndrome: Refilled  Oxycodone 5/ 325 mg one tablet twice a day as needed for pain #60. We will continue the opioid monitoring program, this consists of regular clinic visits, examinations, urine drug screen, pill counts as well as use of [place] Controlled Substance Reporting system. A 12 month History has been reviewed on the [place] Controlled Substance Reporting System 02/19/2023 5. Cervicalgia/ Cervical Radiculitis: Continue Gabapentin. Continue to Monitor.  6. Myofascial Pain Syndrome: Continue Methocarbamol . Continue HEP as Tolerated. Continue to Monitor.    F/U in 1 month

## 2023-02-25 ENCOUNTER — Emergency Department (HOSPITAL_BASED_OUTPATIENT_CLINIC_OR_DEPARTMENT_OTHER)
Admission: EM | Admit: 2023-02-25 | Discharge: 2023-02-25 | Disposition: A | Discharge status: Home / Self Care | Payer: No Typology Code available for payment source | Attending: Emergency Medicine | Admitting: Emergency Medicine

## 2023-02-25 ENCOUNTER — Other Ambulatory Visit: Payer: Self-pay

## 2023-02-25 ENCOUNTER — Encounter (HOSPITAL_BASED_OUTPATIENT_CLINIC_OR_DEPARTMENT_OTHER): Payer: Self-pay | Admitting: Pediatrics

## 2023-02-25 DIAGNOSIS — I10 Essential (primary) hypertension: Secondary | ICD-10-CM | POA: Insufficient documentation

## 2023-02-25 DIAGNOSIS — L03116 Cellulitis of left lower limb: Secondary | ICD-10-CM | POA: Insufficient documentation

## 2023-02-25 DIAGNOSIS — Z79899 Other long term (current) drug therapy: Secondary | ICD-10-CM | POA: Diagnosis not present

## 2023-02-25 DIAGNOSIS — Z7901 Long term (current) use of anticoagulants: Secondary | ICD-10-CM | POA: Diagnosis not present

## 2023-02-25 DIAGNOSIS — Z7984 Long term (current) use of oral hypoglycemic drugs: Secondary | ICD-10-CM | POA: Diagnosis not present

## 2023-02-25 DIAGNOSIS — R21 Rash and other nonspecific skin eruption: Secondary | ICD-10-CM | POA: Diagnosis present

## 2023-02-25 DIAGNOSIS — E119 Type 2 diabetes mellitus without complications: Secondary | ICD-10-CM | POA: Diagnosis not present

## 2023-02-25 MED ORDER — DOXYCYCLINE HYCLATE 100 MG PO CAPS: 100.0000 mg | ORAL_CAPSULE | Freq: Two times a day (BID) | ORAL | 0 refills | Status: DC

## 2023-02-25 NOTE — ED Provider Notes (Signed)
[facility] Provider Note   CSN: [id number] Arrival date & time: 02/25/23  1251     History  Chief Complaint  Patient presents with   Rash    Brittany Tran is a 62 y.o. female.  Patient here with redness to her left thigh, history of leg infections.  History of blood clots on Coumadin.  Last Coumadin was normal about a week ago.  Redness and swelling to the left thigh anteriorly started about 3 days ago.  Has not gotten much worse but it seems like her skin infection might be back.  She denies any knee pain or trauma or falls.  Nothing makes it worse or better.  Denies any fever or chills.  Patient has history of hypertension, diabetes, fibromyalgia.  The history is provided by the patient.       Home Medications Prior to Admission medications   Medication Sig Start Date End Date Taking? Authorizing Provider  doxycycline (VIBRAMYCIN) 100 MG capsule Take 1 capsule (100 mg total) by mouth 2 (two) times daily. 02/25/23  Yes Aidenjames Heckmann, DO  acetaminophen (TYLENOL) 325 MG tablet Take 2 tablets (650 mg total) by mouth every 6 (six) hours as needed for mild pain or fever. 05/21/18   Shon Hale, MD  Docusate Sodium (DSS) 100 MG CAPS Take by mouth. 03/07/22   [provider]  DULoxetine (CYMBALTA) 20 MG capsule Take 1 capsule (20 mg total) by mouth daily. 09/03/22   Raulkar, Drema Pry, MD  folic acid (FOLVITE) 1 MG tablet Take by mouth.    [provider]  furosemide (LASIX) 20 MG tablet Take 1 tablet (20 mg total) by mouth daily. Patient not taking: Reported on 01/24/2023 02/14/22 02/14/23  Pokhrel, Rebekah Chesterfield, MD  gabapentin (NEURONTIN) 300 MG capsule Take 1 capsule (300 mg total) by mouth 3 (three) times daily. 11/01/22   Raulkar, Drema Pry, MD  glucose blood (TRUE METRIX BLOOD GLUCOSE TEST) test strip Use as instructed 06/22/18   Storm Frisk, MD  hydrocerin (EUCERIN) CREA Apply 1 Application topically 2 (two) times  daily. To dry skin and areas of scaling 02/12/22   Pokhrel, Laxman, MD  methocarbamol (ROBAXIN) 500 MG tablet TAKE 1 TABLET(500 MG) BY MOUTH DAILY AS NEEDED FOR MUSCLE SPASMS 09/30/22   Raulkar, Drema Pry, MD  methotrexate (RHEUMATREX) 2.5 MG tablet Take by mouth. 07/05/19   [provider]  Multiple Vitamin (MULTIVITAMIN WITH MINERALS) TABS tablet Take 1 tablet by mouth daily. 02/13/22   Pokhrel, Rebekah Chesterfield, MD  oxyCODONE (OXY IR/ROXICODONE) 5 MG immediate release tablet Take 1 tablet (5 mg total) by mouth 2 (two) times daily as needed for severe pain. 09/03/22   Raulkar, Drema Pry, MD  oxyCODONE-acetaminophen (PERCOCET) 5-325 MG tablet Take 1 tablet by mouth 2 (two) times daily as needed for severe pain. 02/19/23   Jones Bales, NP  polyethylene glycol (MIRALAX / GLYCOLAX) 17 g packet Take 17 g by mouth daily as needed for moderate constipation or mild constipation. 02/12/22   Pokhrel, Rebekah Chesterfield, MD  predniSONE (DELTASONE) 1 MG tablet Take by mouth. 05/20/21   [provider]  traZODone (DESYREL) 50 MG tablet Take by mouth. 07/26/22   [provider]  TRUEPLUS LANCETS 28G MISC Use to check blood sugar daily. 06/22/18   Storm Frisk, MD  Vitamin D, Ergocalciferol, (DRISDOL) 1.25 MG (50000 UNIT) CAPS capsule Take 1 capsule (50,000 Units total) by mouth every 7 (seven) days. 12/26/21   Sula Soda  P, MD  warfarin (COUMADIN) 2 MG tablet SMARTSIG:1 Tablet(s) By Mouth Every Evening    [provider]  warfarin (COUMADIN) 5 MG tablet Take by mouth. 07/03/22 10/01/22  [provider]      Allergies    Penicillins, Vancomycin, Cefazolin, Penicillin g benzathine, and Sulfa antibiotics    Review of Systems   Review of Systems  Physical Exam Updated Vital Signs BP 105/60   Pulse (!) 107   Temp 98.7 F (37.1 C) (Oral)   Resp 17   Ht 5\' 6"  (1.676 m)   Wt 107 kg   SpO2 100%   BMI 38.09 kg/m  Physical Exam Vitals and nursing note reviewed.  Constitutional:       General: She is not in acute distress.    Appearance: She is well-developed. She is not ill-appearing.  HENT:     Head: Normocephalic and atraumatic.     Nose: Nose normal.     Mouth/Throat:     Mouth: Mucous membranes are moist.  Eyes:     Extraocular Movements: Extraocular movements intact.     Conjunctiva/sclera: Conjunctivae normal.     Pupils: Pupils are equal, round, and reactive to light.  Cardiovascular:     Rate and Rhythm: Normal rate and regular rhythm.     Pulses: Normal pulses.     Heart sounds: Normal heart sounds. No murmur heard. Pulmonary:     Effort: Pulmonary effort is normal. No respiratory distress.     Breath sounds: Normal breath sounds.  Abdominal:     Palpations: Abdomen is soft.     Tenderness: There is no abdominal tenderness.  Musculoskeletal:        General: No swelling or tenderness. Normal range of motion.     Cervical back: Neck supple.     Comments: Normal range of motion of the left knee without any pain  Skin:    General: Skin is warm and dry.     Capillary Refill: Capillary refill takes less than 2 seconds.     Findings: Erythema present.     Comments: Redness erythema and warmth to the inner portion of her left thigh  Neurological:     General: No focal deficit present.     Mental Status: She is alert.     Sensory: No sensory deficit.     Motor: No weakness.  Psychiatric:        Mood and Affect: Mood normal.     ED Results / Procedures / Treatments   Labs (all labs ordered are listed, but only abnormal results are displayed) Labs Reviewed - No data to display  EKG None  Radiology No results found.  Procedures Procedures    Medications Ordered in ED Medications - No data to display  ED Course/ Medical Decision Making/ A&P                                 Medical Decision Making  Brittany Tran is here with leg redness.  History of DVT, rheumatoid arthritis, skin infections.  She got erythema and warmth to the  anterior portion of her medial left thigh.  Seems consistent with a cellulitis.  She is not having any pain with left knee movement.  Have no concern for septic joint.  I do not have any concern for traumatic process.  She is got good pulses on exam.  She has had skin infections like this  in the past.  She has not had fever or chills.  Ultimately she is got reassuring vital signs.  This is fairly localized and she states that it has been there for a few days and has not gotten any worse.  Overall we do some shared decision making and I think it would be reasonable to trial her on oral antibiotics.  Talked with pharmacy given her drug allergies and that as she is on Coumadin and they are okay with doxycycline.  She may need to follow-up in about 5 to 7 days to have her INR checked and she was made aware of this.  I have no concern for blood clot.  Her INR was 2.6 a few days ago.  This is more anterior and unlikely that this is a blood clot.  We talked about this as well.  Overall strongly encouraged wound recheck in about 48 to 72 hours.  She understands return precautions.  Discharged in good condition.  This chart was dictated using voice recognition software.  Despite best efforts to proofread,  errors can occur which can change the documentation meaning.         Final Clinical Impression(s) / ED Diagnoses Final diagnoses:  Cellulitis of left lower extremity    Rx / DC Orders ED Discharge Orders          Ordered    doxycycline (VIBRAMYCIN) 100 MG capsule  2 times daily        02/25/23 1448              Virgina Norfolk, DO 02/25/23 1448

## 2023-02-25 NOTE — Discharge Instructions (Addendum)
Recommend follow-up with your primary care doctor for wound recheck in about 48 to 72 hours.  Please return to the ED if symptoms worsen as we discussed.  You will need your INR checked probably again in the next 5 to 7 days.

## 2023-02-25 NOTE — ED Triage Notes (Signed)
C/O rash around left knee noticed around Saturday morning, describes it as burns and painful but not itchy.

## 2023-03-20 ENCOUNTER — Encounter: Payer: No Typology Code available for payment source | Admitting: Registered Nurse

## 2023-03-21 ENCOUNTER — Encounter: Payer: No Typology Code available for payment source | Attending: Registered Nurse | Admitting: Registered Nurse

## 2023-03-21 ENCOUNTER — Encounter: Payer: Self-pay | Admitting: Registered Nurse

## 2023-03-21 VITALS — BP 105/73 | HR 71 | Ht 66.0 in | Wt 241.2 lb

## 2023-03-21 DIAGNOSIS — M797 Fibromyalgia: Secondary | ICD-10-CM | POA: Insufficient documentation

## 2023-03-21 DIAGNOSIS — M546 Pain in thoracic spine: Secondary | ICD-10-CM | POA: Insufficient documentation

## 2023-03-21 DIAGNOSIS — G894 Chronic pain syndrome: Secondary | ICD-10-CM | POA: Insufficient documentation

## 2023-03-21 DIAGNOSIS — M2550 Pain in unspecified joint: Secondary | ICD-10-CM | POA: Insufficient documentation

## 2023-03-21 DIAGNOSIS — M7918 Myalgia, other site: Secondary | ICD-10-CM | POA: Insufficient documentation

## 2023-03-21 DIAGNOSIS — M542 Cervicalgia: Secondary | ICD-10-CM | POA: Diagnosis present

## 2023-03-21 DIAGNOSIS — M25512 Pain in left shoulder: Secondary | ICD-10-CM | POA: Insufficient documentation

## 2023-03-21 DIAGNOSIS — M792 Neuralgia and neuritis, unspecified: Secondary | ICD-10-CM | POA: Insufficient documentation

## 2023-03-21 DIAGNOSIS — Z79899 Other long term (current) drug therapy: Secondary | ICD-10-CM | POA: Insufficient documentation

## 2023-03-21 DIAGNOSIS — G8929 Other chronic pain: Secondary | ICD-10-CM | POA: Insufficient documentation

## 2023-03-21 DIAGNOSIS — M25511 Pain in right shoulder: Secondary | ICD-10-CM | POA: Diagnosis present

## 2023-03-21 DIAGNOSIS — Z5181 Encounter for therapeutic drug level monitoring: Secondary | ICD-10-CM | POA: Diagnosis present

## 2023-03-21 NOTE — Progress Notes (Signed)
Subjective:    Patient ID: Brittany Tran, female    DOB: [birth year], 62 y.o.   MRN: [id number]  HPI: Brittany Tran is a 62 y.o. female who returns for follow up appointment for chronic pain and medication refill. She states her pain is located in her neck, bilateral shoulders, bilateral upper extremities with tingling and burning, she denies any chest pain or chest discomfort. She rates her pain 6. Her current exercise regime is walking and performing stretching exercises.  Brittany Tran Morphine equivalent is 15.00 MME.   Last Oral Swab was Performed on 01/24/2023, it was consistent.     Pain Inventory Average Pain 6 Pain Right Now 6 My pain is aching  In the last 24 hours, has pain interfered with the following? General activity 5 Relation with others 5 Enjoyment of life 5 What TIME of day is your pain at its worst? evening and night Sleep (in general) Fair  Pain is worse with: some activites Pain improves with: medication Relief from Meds: 8  Family History  Problem Relation Age of Onset   Diabetes Mother    Social History   Socioeconomic History   Marital status: Single    Spouse name: Not on file   Number of children: Not on file   Years of education: Not on file   Highest education level: Not on file  Occupational History   Occupation: unemployed  Tobacco Use   Smoking status: Never   Smokeless tobacco: Never  Vaping Use   Vaping status: Never Used  Substance and Sexual Activity   Alcohol use: No   Drug use: No   Sexual activity: Not on file  Other Topics Concern   Not on file  Social History Narrative   Not on file   Social Determinants of Health   Financial Resource Strain: Not on file  Food Insecurity: Low Risk  (01/31/2023)   Received from [facility]   Hunger Vital Sign    Worried About Running Out of Food in the Last Year: Never true    Ran Out of Food in the Last Year: Never true  Transportation Needs: Not on file (01/31/2023)   Physical Activity: Not on file  Stress: Not on file  Social Connections: Not on file   Past Surgical History:  Procedure Laterality Date   HARDWARE REMOVAL Left 09/29/2014   Procedure: REMOVAL OF DEEP IMPLANT OF LEFT FIBULA  AND TIBIA (SEPARATE INCISIONS);  Surgeon: Toni Arthurs, MD;  Location: [facility];  Service: Orthopedics;  Laterality: Left;   ORIF ANKLE FRACTURE  1988   left   Past Surgical History:  Procedure Laterality Date   HARDWARE REMOVAL Left 09/29/2014   Procedure: REMOVAL OF DEEP IMPLANT OF LEFT FIBULA  AND TIBIA (SEPARATE INCISIONS);  Surgeon: Toni Arthurs, MD;  Location: [facility];  Service: Orthopedics;  Laterality: Left;   ORIF ANKLE FRACTURE  1988   left   Past Medical History:  Diagnosis Date   Cellulitis    Diabetes mellitus without complication (HCC)    Diastolic heart failure (HCC)    Echo 03/14/15 Grade I DD   Fibromyalgia    History of DVT (deep vein thrombosis)    Hypertension    Rheumatoid arthritis (HCC)    Sepsis (HCC) 12/2019   BP 105/73   Pulse 71   Ht 5\' 6"  (1.676 m)   Wt 241 lb 3.2 oz (109.4 kg)   SpO2 98%   BMI 38.93 kg/m  Opioid Risk Score:   Fall Risk Score:  `1  Depression screen [facility] 2/9     03/21/2023    8:52 AM 02/19/2023   10:16 AM 01/24/2023   10:05 AM 11/01/2022    8:47 AM 10/03/2022   10:10 AM 09/03/2022    9:34 AM 08/06/2022    9:40 AM  Depression screen PHQ 2/9  Decreased Interest 0 0 0 0 1 0 0  Down, Depressed, Hopeless 0 0 0 0 1 0 0  PHQ - 2 Score 0 0 0 0 2 0 0     Review of Systems  Constitutional: Negative.   HENT: Negative.    Eyes: Negative.   Respiratory: Negative.    Cardiovascular: Negative.   Gastrointestinal: Negative.   Endocrine: Negative.   Genitourinary: Negative.   Musculoskeletal:  Positive for back pain and gait problem.  Skin: Negative.   Allergic/Immunologic: Negative.   Hematological:  Bruises/bleeds easily.       Warfarin  Psychiatric/Behavioral: Negative.     All other systems reviewed and are negative.      Objective:   Physical Exam Vitals and nursing note reviewed.  Constitutional:      Appearance: Normal appearance.  Neck:     Comments: Cervical Paraspinal Tenderness: C-5-C-6 Cardiovascular:     Rate and Rhythm: Normal rate and regular rhythm.     Pulses: Normal pulses.     Heart sounds: Normal heart sounds.  Pulmonary:     Effort: Pulmonary effort is normal.     Breath sounds: Normal breath sounds.  Musculoskeletal:     Comments: Normal Muscle Bulk and Muscle Testing Reveals:  Upper Extremities: Right: Decreased ROM  90 Degrees and Muscle Strength 5/5  Thoracic Paraspinal Tenderness: T-4-T-6 Lower Extremities: Full ROM and Muscle Strength 5/5 Left Leg Dressing Intact Arises from Table slowly using cane for support Narrow Based  Gait     Skin:    General: Skin is warm and dry.  Neurological:     Mental Status: She is alert and oriented to person, place, and time.  Psychiatric:        Mood and Affect: Mood normal.        Behavior: Behavior normal.         Assessment & Plan:  Chronic Bilateral Thoracic Back Pain/ Right Lumbar Radiculitis: Continue HEP as tolerated. Continue Gabapentin. Continue to Monitor. 03/21/2023 Fibromyalgia: Continue HEP as tolerated. Continue Gabapentin. Continue to Monitor. 03/21/2023 Rheumatoid Arthritis: Rheumatology Following. 03/21/2023 Chronic Pain Syndrome: Continue: No script given, using daily as needed for pain.  Oxycodone 5/ 325 mg one tablet twice a day as needed for pain #60. We will continue the opioid monitoring program, this consists of regular clinic visits, examinations, urine drug screen, pill counts as well as use of [place] Controlled Substance Reporting system. A 12 month History has been reviewed on the [place] Controlled Substance Reporting System 03/21/2023 5. Cervicalgia/ Cervical Radiculitis: Continue Gabapentin. Continue to Monitor. 03/21/2023 6. Myofascial  Pain Syndrome: Continue Methocarbamol . Continue HEP as Tolerated. Continue to Monitor. 03/21/2023 7. Left Lower Extremity Cellulitis: [facility] in [facility] Following. Continue to Monitor.  F/U in 1 month

## 2023-05-06 ENCOUNTER — Encounter: Payer: No Typology Code available for payment source | Attending: Registered Nurse | Admitting: Registered Nurse

## 2023-05-06 VITALS — Ht 66.0 in | Wt 241.0 lb

## 2023-05-06 DIAGNOSIS — G8929 Other chronic pain: Secondary | ICD-10-CM | POA: Insufficient documentation

## 2023-05-06 DIAGNOSIS — M797 Fibromyalgia: Secondary | ICD-10-CM | POA: Diagnosis not present

## 2023-05-06 DIAGNOSIS — M25511 Pain in right shoulder: Secondary | ICD-10-CM | POA: Insufficient documentation

## 2023-05-06 DIAGNOSIS — Z79891 Long term (current) use of opiate analgesic: Secondary | ICD-10-CM | POA: Insufficient documentation

## 2023-05-06 DIAGNOSIS — G894 Chronic pain syndrome: Secondary | ICD-10-CM | POA: Insufficient documentation

## 2023-05-06 DIAGNOSIS — M546 Pain in thoracic spine: Secondary | ICD-10-CM

## 2023-05-06 DIAGNOSIS — M545 Low back pain, unspecified: Secondary | ICD-10-CM | POA: Insufficient documentation

## 2023-05-06 DIAGNOSIS — M2550 Pain in unspecified joint: Secondary | ICD-10-CM | POA: Insufficient documentation

## 2023-05-06 DIAGNOSIS — Z5181 Encounter for therapeutic drug level monitoring: Secondary | ICD-10-CM | POA: Insufficient documentation

## 2023-05-06 DIAGNOSIS — M7918 Myalgia, other site: Secondary | ICD-10-CM | POA: Insufficient documentation

## 2023-05-06 DIAGNOSIS — M25512 Pain in left shoulder: Secondary | ICD-10-CM | POA: Insufficient documentation

## 2023-05-06 NOTE — Progress Notes (Unsigned)
Subjective:    Patient ID: Chenika Morgenstern, female    DOB: Jul 28, 1960, 62 y.o.   MRN: [id number]  HPI: Jamilee Volner is a 62 y.o. female who returns for follow up appointment for chronic pain and medication refill. states *** pain is located in  ***. rates pain ***. current exercise regime is walking and performing stretching exercises.  Ms. Hurtado Morphine equivalent is *** MME.   Last Oral Swab was Performed on 01/24/2023, it was consistent.    Pain Inventory Average Pain 8 Pain Right Now 8 My pain is sharp and burning  In the last 24 hours, has pain interfered with the following? General activity 0 Relation with others 0 Enjoyment of life 0 What TIME of day is your pain at its worst? varies Sleep (in general) Fair  Pain is worse with: sitting and standing Pain improves with: medication Relief from Meds: 7  Family History  Problem Relation Age of Onset   Diabetes Mother    Social History   Socioeconomic History   Marital status: Single    Spouse name: Not on file   Number of children: Not on file   Years of education: Not on file   Highest education level: Not on file  Occupational History   Occupation: unemployed  Tobacco Use   Smoking status: Never   Smokeless tobacco: Never  Vaping Use   Vaping status: Never Used  Substance and Sexual Activity   Alcohol use: No   Drug use: No   Sexual activity: Not on file  Other Topics Concern   Not on file  Social History Narrative   Not on file   Social Determinants of Health   Financial Resource Strain: Not on file  Food Insecurity: Low Risk  (01/31/2023)   Received from [facility]   Hunger Vital Sign    Worried About Running Out of Food in the Last Year: Never true    Ran Out of Food in the Last Year: Never true  Transportation Needs: Not on file (01/31/2023)  Physical Activity: Not on file  Stress: Not on file  Social Connections: Not on file   Past Surgical History:  Procedure Laterality Date    HARDWARE REMOVAL Left 09/29/2014   Procedure: REMOVAL OF DEEP IMPLANT OF LEFT FIBULA  AND TIBIA (SEPARATE INCISIONS);  Surgeon: Toni Arthurs, MD;  Location: [facility];  Service: Orthopedics;  Laterality: Left;   ORIF ANKLE FRACTURE  1988   left   Past Surgical History:  Procedure Laterality Date   HARDWARE REMOVAL Left 09/29/2014   Procedure: REMOVAL OF DEEP IMPLANT OF LEFT FIBULA  AND TIBIA (SEPARATE INCISIONS);  Surgeon: Toni Arthurs, MD;  Location: [facility];  Service: Orthopedics;  Laterality: Left;   ORIF ANKLE FRACTURE  1988   left   Past Medical History:  Diagnosis Date   Cellulitis    Diabetes mellitus without complication (HCC)    Diastolic heart failure (HCC)    Echo 03/14/15 Grade I DD   Fibromyalgia    History of DVT (deep vein thrombosis)    Hypertension    Rheumatoid arthritis (HCC)    Sepsis (HCC) 12/2019   There were no vitals taken for this visit.  Opioid Risk Score:   Fall Risk Score:  `1  Depression screen [facility] 2/9     03/21/2023    8:52 AM 02/19/2023   10:16 AM 01/24/2023   10:05 AM 11/01/2022    8:47 AM 10/03/2022  10:10 AM 09/03/2022    9:34 AM 08/06/2022    9:40 AM  Depression screen PHQ 2/9  Decreased Interest 0 0 0 0 1 0 0  Down, Depressed, Hopeless 0 0 0 0 1 0 0  PHQ - 2 Score 0 0 0 0 2 0 0     Review of Systems  Musculoskeletal:  Positive for myalgias.       Shoulder pain  All other systems reviewed and are negative.     Objective:   Physical Exam        Assessment & Plan:  Chronic Bilateral Thoracic Back Pain/ Right Lumbar Radiculitis: Continue HEP as tolerated. Continue Gabapentin. Continue to Monitor. 03/21/2023 Fibromyalgia: Continue HEP as tolerated. Continue Gabapentin. Continue to Monitor. 03/21/2023 Rheumatoid Arthritis: Rheumatology Following. 03/21/2023 Chronic Pain Syndrome: Continue: No script given, using daily as needed for pain.  Oxycodone 5/ 325 mg one tablet twice a day as needed for pain #60.  We will continue the opioid monitoring program, this consists of regular clinic visits, examinations, urine drug screen, pill counts as well as use of [place] Controlled Substance Reporting system. A 12 month History has been reviewed on the [place] Controlled Substance Reporting System 03/21/2023 5. Cervicalgia/ Cervical Radiculitis: Continue Gabapentin. Continue to Monitor. 03/21/2023 6. Myofascial Pain Syndrome: Continue Methocarbamol . Continue HEP as Tolerated. Continue to Monitor. 03/21/2023 7. Left Lower Extremity Cellulitis: Wound [facility] Center in [facility] Following. Continue to Monitor.  F/U in 1 month

## 2023-05-08 ENCOUNTER — Encounter: Payer: Self-pay | Admitting: Registered Nurse

## 2023-07-08 ENCOUNTER — Encounter: Payer: No Typology Code available for payment source | Admitting: Registered Nurse

## 2023-07-08 NOTE — Progress Notes (Deleted)
Subjective:    Patient ID: Brittany Tran, female    DOB: September 14, 1960, 63 y.o.   MRN: [id number]  HPI  Pain Inventory Average Pain {NUMBERS; 0-10:5044} Pain Right Now {NUMBERS; 0-10:5044} My pain is {PAIN DESCRIPTION:21022940}  In the last 24 hours, has pain interfered with the following? General activity {NUMBERS; 0-10:5044} Relation with others {NUMBERS; 0-10:5044} Enjoyment of life {NUMBERS; 0-10:5044} What TIME of day is your pain at its worst? {time of day:24191} Sleep (in general) {BHH GOOD/FAIR/POOR:22877}  Pain is worse with: {ACTIVITIES:21022942} Pain improves with: {PAIN IMPROVES WUJW:11914782} Relief from Meds: {NUMBERS; 0-10:5044}  Family History  Problem Relation Age of Onset   Diabetes Mother    Social History   Socioeconomic History   Marital status: Single    Spouse name: Not on file   Number of children: Not on file   Years of education: Not on file   Highest education level: Not on file  Occupational History   Occupation: unemployed  Tobacco Use   Smoking status: Never   Smokeless tobacco: Never  Vaping Use   Vaping status: Never Used  Substance and Sexual Activity   Alcohol use: No   Drug use: No   Sexual activity: Not on file  Other Topics Concern   Not on file  Social History Narrative   Not on file   Social Drivers of Health   Financial Resource Strain: Not on file  Food Insecurity: Low Risk  (01/31/2023)   Received from [facility]   Hunger Vital Sign    Worried About Running Out of Food in the Last Year: Never true    Ran Out of Food in the Last Year: Never true  Transportation Needs: Not on file (01/31/2023)  Physical Activity: Not on file  Stress: Not on file  Social Connections: Not on file   Past Surgical History:  Procedure Laterality Date   HARDWARE REMOVAL Left 09/29/2014   Procedure: REMOVAL OF DEEP IMPLANT OF LEFT FIBULA  AND TIBIA (SEPARATE INCISIONS);  Surgeon: Toni Arthurs, MD;  Location: [facility];  Service: Orthopedics;  Laterality: Left;   ORIF ANKLE FRACTURE  1988   left   Past Surgical History:  Procedure Laterality Date   HARDWARE REMOVAL Left 09/29/2014   Procedure: REMOVAL OF DEEP IMPLANT OF LEFT FIBULA  AND TIBIA (SEPARATE INCISIONS);  Surgeon: Toni Arthurs, MD;  Location: [facility];  Service: Orthopedics;  Laterality: Left;   ORIF ANKLE FRACTURE  1988   left   Past Medical History:  Diagnosis Date   Cellulitis    Diabetes mellitus without complication (HCC)    Diastolic heart failure (HCC)    Echo 03/14/15 Grade I DD   Fibromyalgia    History of DVT (deep vein thrombosis)    Hypertension    Rheumatoid arthritis (HCC)    Sepsis (HCC) 12/2019   There were no vitals taken for this visit.  Opioid Risk Score:   Fall Risk Score:  `1  Depression screen [facility] 2/9     03/21/2023    8:52 AM 02/19/2023   10:16 AM 01/24/2023   10:05 AM 11/01/2022    8:47 AM 10/03/2022   10:10 AM 09/03/2022    9:34 AM 08/06/2022    9:40 AM  Depression screen PHQ 2/9  Decreased Interest 0 0 0 0 1 0 0  Down, Depressed, Hopeless 0 0 0 0 1 0 0  PHQ - 2 Score 0 0 0 0 2 0 0  Review of Systems     Objective:   Physical Exam        Assessment & Plan:

## 2023-07-17 ENCOUNTER — Encounter: Payer: Self-pay | Admitting: Registered Nurse

## 2023-07-17 ENCOUNTER — Telehealth: Payer: Self-pay | Admitting: Registered Nurse

## 2023-07-17 ENCOUNTER — Encounter: Payer: No Typology Code available for payment source | Attending: Registered Nurse | Admitting: Registered Nurse

## 2023-07-17 VITALS — BP 128/85 | HR 68 | Ht 66.0 in | Wt 262.6 lb

## 2023-07-17 DIAGNOSIS — G4709 Other insomnia: Secondary | ICD-10-CM | POA: Diagnosis present

## 2023-07-17 DIAGNOSIS — M2550 Pain in unspecified joint: Secondary | ICD-10-CM

## 2023-07-17 DIAGNOSIS — Z79891 Long term (current) use of opiate analgesic: Secondary | ICD-10-CM

## 2023-07-17 DIAGNOSIS — G8929 Other chronic pain: Secondary | ICD-10-CM | POA: Diagnosis present

## 2023-07-17 DIAGNOSIS — Z5181 Encounter for therapeutic drug level monitoring: Secondary | ICD-10-CM

## 2023-07-17 DIAGNOSIS — F329 Major depressive disorder, single episode, unspecified: Secondary | ICD-10-CM

## 2023-07-17 DIAGNOSIS — M25511 Pain in right shoulder: Secondary | ICD-10-CM | POA: Insufficient documentation

## 2023-07-17 DIAGNOSIS — M25512 Pain in left shoulder: Secondary | ICD-10-CM | POA: Insufficient documentation

## 2023-07-17 DIAGNOSIS — M7918 Myalgia, other site: Secondary | ICD-10-CM | POA: Diagnosis present

## 2023-07-17 DIAGNOSIS — G894 Chronic pain syndrome: Secondary | ICD-10-CM | POA: Diagnosis present

## 2023-07-17 DIAGNOSIS — M797 Fibromyalgia: Secondary | ICD-10-CM

## 2023-07-17 MED ORDER — GABAPENTIN 300 MG PO CAPS: 300.0000 mg | ORAL_CAPSULE | Freq: Three times a day (TID) | ORAL | 3 refills | Status: DC

## 2023-07-17 MED ORDER — TRAZODONE HCL 50 MG PO TABS: 50.0000 mg | ORAL_TABLET | Freq: Every evening | ORAL | 1 refills | Status: DC | PRN

## 2023-07-17 NOTE — Progress Notes (Signed)
Subjective:    Patient ID: Brittany Tran, female    DOB: 07/01/1960, 63 y.o.   MRN: [id number]  HPI: Brittany Tran is a 63 y.o. female who returns for follow up appointment for chronic pain and medication refill. She states her pain is located in her bilateral shoulders and bilateral knee pain. She rates her pain 6. Her current exercise regime is walking and performing stretching exercises.  Ms. Huang Morphine equivalent is 15.00 MME.   Oral Swab was Performed Today. Will await for Oral Swab results,if consistent, will resume Oxycodone, she verbalizes understanding.    Pain Inventory Average Pain 9 Pain Right Now 6 My pain is intermittent, sharp, and depends on weather  In the last 24 hours, has pain interfered with the following? General activity 5 Relation with others 5 Enjoyment of life 5 What TIME of day is your pain at its worst? varies Sleep (in general) Poor  Pain is worse with: some activites and weather  restless leg syndrome Pain improves with:  nothing helps with restless leg Relief from Meds: 5  Family History  Problem Relation Age of Onset   Diabetes Mother    Social History   Socioeconomic History   Marital status: Single    Spouse name: Not on file   Number of children: Not on file   Years of education: Not on file   Highest education level: Not on file  Occupational History   Occupation: unemployed  Tobacco Use   Smoking status: Never   Smokeless tobacco: Never  Vaping Use   Vaping status: Never Used  Substance and Sexual Activity   Alcohol use: No   Drug use: No   Sexual activity: Not on file  Other Topics Concern   Not on file  Social History Narrative   Not on file   Social Drivers of Health   Financial Resource Strain: Not on file  Food Insecurity: Low Risk  (01/31/2023)   Received from [facility]   Hunger Vital Sign    Worried About Running Out of Food in the Last Year: Never true    Ran Out of Food in the Last Year: Never  true  Transportation Needs: Not on file (01/31/2023)  Physical Activity: Not on file  Stress: Not on file  Social Connections: Not on file   Past Surgical History:  Procedure Laterality Date   HARDWARE REMOVAL Left 09/29/2014   Procedure: REMOVAL OF DEEP IMPLANT OF LEFT FIBULA  AND TIBIA (SEPARATE INCISIONS);  Surgeon: Toni Arthurs, MD;  Location: [facility];  Service: Orthopedics;  Laterality: Left;   ORIF ANKLE FRACTURE  1988   left   Past Surgical History:  Procedure Laterality Date   HARDWARE REMOVAL Left 09/29/2014   Procedure: REMOVAL OF DEEP IMPLANT OF LEFT FIBULA  AND TIBIA (SEPARATE INCISIONS);  Surgeon: Toni Arthurs, MD;  Location: [facility];  Service: Orthopedics;  Laterality: Left;   ORIF ANKLE FRACTURE  1988   left   Past Medical History:  Diagnosis Date   Cellulitis    Diabetes mellitus without complication (HCC)    Diastolic heart failure (HCC)    Echo 03/14/15 Grade I DD   Fibromyalgia    History of DVT (deep vein thrombosis)    Hypertension    Rheumatoid arthritis (HCC)    Sepsis (HCC) 12/2019   BP 128/85   Pulse 68   Ht 5\' 6"  (1.676 m)   Wt 262 lb 9.6 oz (119.1 kg)  SpO2 95%   BMI 42.38 kg/m   Opioid Risk Score:   Fall Risk Score:  `1  Depression screen [facility] 2/9     07/17/2023   10:24 AM 03/21/2023    8:52 AM 02/19/2023   10:16 AM 01/24/2023   10:05 AM 11/01/2022    8:47 AM 10/03/2022   10:10 AM 09/03/2022    9:34 AM  Depression screen PHQ 2/9  Decreased Interest 1 0 0 0 0 1 0  Down, Depressed, Hopeless 1 0 0 0 0 1 0  PHQ - 2 Score 2 0 0 0 0 2 0    Review of Systems  Musculoskeletal:        Bilateral arms   Neurological:        Restless legs  All other systems reviewed and are negative.      Objective:   Physical Exam Vitals and nursing note reviewed.  Constitutional:      Appearance: Normal appearance. She is obese.  Cardiovascular:     Rate and Rhythm: Normal rate and regular rhythm.     Pulses: Normal  pulses.     Heart sounds: Normal heart sounds.  Pulmonary:     Effort: Pulmonary effort is normal.     Breath sounds: Normal breath sounds.  Musculoskeletal:     Comments: Normal Muscle Bulk and Muscle Testing Reveals:  Upper Extremities:Full  ROM and Muscle Strength 5/5 Lower Extremities: Full ROM and Muscle Strength 5/5 Arises from Table Slowly  Narrow Based  Gait     Skin:    General: Skin is warm and dry.  Neurological:     Mental Status: She is alert and oriented to person, place, and time.  Psychiatric:        Mood and Affect: Mood normal.        Behavior: Behavior normal.           Assessment & Plan:  Chronic Bilateral Thoracic Back Pain/ Chronic Low Back Pain without Sciatica: Continue HEP as tolerated. Continue Gabapentin. Continue to Monitor. 07/17/2023 Fibromyalgia: Continue HEP as tolerated. Continue Gabapentin. Continue to Monitor. 07/17/2023 Rheumatoid Arthritis: Rheumatology Following. 07/17/2023 Chronic Pain Syndrome: Continue: No script given, using daily as needed for pain.  Oxycodone 5/ 325 mg one tablet twice a day as needed for pain #60. We will continue the opioid monitoring program, this consists of regular clinic visits, examinations, urine drug screen, pill counts as well as use of [place] Controlled Substance Reporting system. A 12 month History has been reviewed on the [place] Controlled Substance Reporting System 07/17/2023 5. Cervicalgia/ Cervical Radiculitis: No complaints today. Continue Gabapentin. Continue to Monitor. 07/17/2023 6. Myofascial Pain Syndrome: Continue Methocarbamol . Continue HEP as Tolerated. Continue to Monitor. 07/17/2023. 7. Reactive Depression: Denies suicidal ideation or plan. Reports her mother recently passed away. This will be discussed with Dr Carlis Abbott, she verbalizes understanding. She reports when she was on Cymbalta she had a reaction with Left Eye Stye.  8. Insomnia: Refilled Trazodone. She verbalizes  understanding.   F/U in 2 months

## 2023-07-17 NOTE — Telephone Encounter (Signed)
Dr Carlis Abbott, Ms. Hechavarria reports her mother recently passed away, she had reaction with the Cymbalta, she states she develop Left eye Stye.  Dr Carlis Abbott are you in agreement with Celexa?

## 2023-07-18 ENCOUNTER — Telehealth: Payer: Self-pay | Admitting: Registered Nurse

## 2023-07-18 MED ORDER — CITALOPRAM HYDROBROMIDE 10 MG PO TABS
10.0000 mg | ORAL_TABLET | Freq: Every day | ORAL | 1 refills | Status: DC
Start: 2023-07-18 — End: 2023-10-02

## 2023-07-18 NOTE — Telephone Encounter (Signed)
Spoke with Dr Carlis Abbott regarding Celexa, she was in agreement with prescribing Celexa. My-Chart message was sent to Brittany Tran regarding the above. We will continue to monitor, she will call or send My-Chart message if she develops any side effects.

## 2023-07-20 LAB — DRUG TOX MONITOR 1 W/CONF, ORAL FLD
Amphetamines: NEGATIVE ng/mL (ref <10)
Barbiturates: NEGATIVE ng/mL (ref <10)
Benzodiazepines: NEGATIVE ng/mL (ref <0.50)
Buprenorphine: NEGATIVE ng/mL (ref <0.10)
Cocaine: NEGATIVE ng/mL (ref <5.0)
Codeine: NEGATIVE ng/mL (ref <2.5)
Dihydrocodeine: NEGATIVE ng/mL (ref <2.5)
Fentanyl: NEGATIVE ng/mL (ref <0.10)
Heroin Metabolite: NEGATIVE ng/mL (ref <1.0)
Hydrocodone: NEGATIVE ng/mL (ref <2.5)
Hydromorphone: NEGATIVE ng/mL (ref <2.5)
MARIJUANA: NEGATIVE ng/mL (ref <2.5)
MDMA: NEGATIVE ng/mL (ref <10)
Meprobamate: NEGATIVE ng/mL (ref <2.5)
Methadone: NEGATIVE ng/mL (ref <5.0)
Morphine: NEGATIVE ng/mL (ref <2.5)
Nicotine Metabolite: NEGATIVE ng/mL (ref <5.0)
Norhydrocodone: NEGATIVE ng/mL (ref <2.5)
Noroxycodone: 3.9 ng/mL — ABNORMAL HIGH (ref <2.5)
Opiates: POSITIVE ng/mL — AB (ref <2.5)
Oxycodone: 32.2 ng/mL — ABNORMAL HIGH (ref <2.5)
Oxymorphone: NEGATIVE ng/mL (ref <2.5)
Phencyclidine: NEGATIVE ng/mL (ref <10)
Tapentadol: NEGATIVE ng/mL (ref <5.0)
Tramadol: NEGATIVE ng/mL (ref <5.0)
Zolpidem: NEGATIVE ng/mL (ref <5.0)

## 2023-07-20 LAB — DRUG TOX ALC METAB W/CON, ORAL FLD: Alcohol Metabolite: NEGATIVE ng/mL (ref <25)

## 2023-09-12 ENCOUNTER — Encounter: Payer: No Typology Code available for payment source | Admitting: Registered Nurse

## 2023-09-16 ENCOUNTER — Encounter: Attending: Registered Nurse | Admitting: Registered Nurse

## 2023-09-24 ENCOUNTER — Encounter: Attending: Registered Nurse | Admitting: Registered Nurse

## 2023-09-24 ENCOUNTER — Encounter: Payer: Self-pay | Admitting: Registered Nurse

## 2023-09-24 VITALS — BP 111/77 | HR 73 | Ht 66.0 in | Wt 284.6 lb

## 2023-09-24 DIAGNOSIS — Z5181 Encounter for therapeutic drug level monitoring: Secondary | ICD-10-CM | POA: Insufficient documentation

## 2023-09-24 DIAGNOSIS — G894 Chronic pain syndrome: Secondary | ICD-10-CM | POA: Insufficient documentation

## 2023-09-24 DIAGNOSIS — M7918 Myalgia, other site: Secondary | ICD-10-CM | POA: Insufficient documentation

## 2023-09-24 DIAGNOSIS — M25511 Pain in right shoulder: Secondary | ICD-10-CM | POA: Diagnosis not present

## 2023-09-24 DIAGNOSIS — M25512 Pain in left shoulder: Secondary | ICD-10-CM | POA: Diagnosis present

## 2023-09-24 DIAGNOSIS — M545 Low back pain, unspecified: Secondary | ICD-10-CM | POA: Diagnosis not present

## 2023-09-24 DIAGNOSIS — G8929 Other chronic pain: Secondary | ICD-10-CM | POA: Diagnosis present

## 2023-09-24 DIAGNOSIS — M2550 Pain in unspecified joint: Secondary | ICD-10-CM | POA: Insufficient documentation

## 2023-09-24 DIAGNOSIS — Z79899 Other long term (current) drug therapy: Secondary | ICD-10-CM | POA: Diagnosis present

## 2023-09-24 DIAGNOSIS — M797 Fibromyalgia: Secondary | ICD-10-CM | POA: Insufficient documentation

## 2023-09-24 MED ORDER — OXYCODONE-ACETAMINOPHEN 5-325 MG PO TABS: 1.0000 | ORAL_TABLET | Freq: Two times a day (BID) | ORAL | 0 refills | Status: DC | PRN

## 2023-09-24 NOTE — Progress Notes (Unsigned)
 Subjective:    Patient ID: Brittany Tran, female    DOB: 05-May-1961, 63 y.o.   MRN: [id number]  HPI: Brittany Tran is a 63 y.o. female who returns for follow up appointment for chronic pain and medication refill. states *** pain is located in  ***. rates pain ***. current exercise regime is walking and performing stretching exercises.   [name] Morphine equivalent is 10.00 MME.   Last UDS was Performed on 07/17/2023, it was consistent.    Pain Inventory Average Pain 8 Pain Right Now 8 My pain is sharp and aching  In the last 24 hours, has pain interfered with the following? General activity 4 Relation with others 5 Enjoyment of life 7 What TIME of day is your pain at its worst? morning  and daytime Sleep (in general) NA  Pain is worse with: walking and bending Pain improves with: medication Relief from Meds: 8  Family History  Problem Relation Age of Onset   Diabetes Mother    Social History   Socioeconomic History   Marital status: Single    Spouse name: Not on file   Number of children: Not on file   Years of education: Not on file   Highest education level: Not on file  Occupational History   Occupation: unemployed  Tobacco Use   Smoking status: Never   Smokeless tobacco: Never  Vaping Use   Vaping status: Never Used  Substance and Sexual Activity   Alcohol use: No   Drug use: No   Sexual activity: Not on file  Other Topics Concern   Not on file  Social History Narrative   Not on file   Social Drivers of Health   Financial Resource Strain: Not on file  Food Insecurity: Low Risk  (01/31/2023)   Received from [facility]   Hunger Vital Sign    Worried About Running Out of Food in the Last Year: Never true    Ran Out of Food in the Last Year: Never true  Transportation Needs: Not on file (01/31/2023)  Physical Activity: Not on file  Stress: Not on file  Social Connections: Not on file   Past Surgical History:  Procedure Laterality Date    HARDWARE REMOVAL Left 09/29/2014   Procedure: REMOVAL OF DEEP IMPLANT OF LEFT FIBULA  AND TIBIA (SEPARATE INCISIONS);  Surgeon: Toni Arthurs, MD;  Location: [facility];  Service: Orthopedics;  Laterality: Left;   ORIF ANKLE FRACTURE  1988   left   Past Surgical History:  Procedure Laterality Date   HARDWARE REMOVAL Left 09/29/2014   Procedure: REMOVAL OF DEEP IMPLANT OF LEFT FIBULA  AND TIBIA (SEPARATE INCISIONS);  Surgeon: Toni Arthurs, MD;  Location: [facility];  Service: Orthopedics;  Laterality: Left;   ORIF ANKLE FRACTURE  1988   left   Past Medical History:  Diagnosis Date   Cellulitis    Diabetes mellitus without complication (HCC)    Diastolic heart failure (HCC)    Echo 03/14/15 Grade I DD   Fibromyalgia    History of DVT (deep vein thrombosis)    Hypertension    Rheumatoid arthritis (HCC)    Sepsis (HCC) 12/2019   There were no vitals taken for this visit.  Opioid Risk Score:   Fall Risk Score:  `1  Depression screen [facility] 2/9     07/17/2023   10:24 AM 03/21/2023    8:52 AM 02/19/2023   10:16 AM 01/24/2023   10:05 AM 11/01/2022  8:47 AM 10/03/2022   10:10 AM 09/03/2022    9:34 AM  Depression screen PHQ 2/9  Decreased Interest 1 0 0 0 0 1 0  Down, Depressed, Hopeless 1 0 0 0 0 1 0  PHQ - 2 Score 2 0 0 0 0 2 0      Review of Systems  All other systems reviewed and are negative.      Objective:   Physical Exam        Assessment & Plan:

## 2023-09-24 NOTE — Patient Instructions (Signed)
 Send a My-Chart message in two weeks with update on medication ,

## 2023-09-28 ENCOUNTER — Other Ambulatory Visit: Payer: Self-pay | Admitting: Registered Nurse

## 2023-11-18 ENCOUNTER — Encounter (HOSPITAL_BASED_OUTPATIENT_CLINIC_OR_DEPARTMENT_OTHER): Payer: Self-pay | Admitting: Urology

## 2023-11-18 ENCOUNTER — Emergency Department (HOSPITAL_BASED_OUTPATIENT_CLINIC_OR_DEPARTMENT_OTHER)
Admission: EM | Admit: 2023-11-18 | Discharge: 2023-11-18 | Disposition: A | Discharge status: Home / Self Care | Attending: Emergency Medicine | Admitting: Emergency Medicine

## 2023-11-18 ENCOUNTER — Other Ambulatory Visit: Payer: Self-pay

## 2023-11-18 DIAGNOSIS — Z7901 Long term (current) use of anticoagulants: Secondary | ICD-10-CM | POA: Insufficient documentation

## 2023-11-18 DIAGNOSIS — E119 Type 2 diabetes mellitus without complications: Secondary | ICD-10-CM | POA: Diagnosis not present

## 2023-11-18 DIAGNOSIS — M5432 Sciatica, left side: Secondary | ICD-10-CM | POA: Insufficient documentation

## 2023-11-18 DIAGNOSIS — M25552 Pain in left hip: Secondary | ICD-10-CM | POA: Diagnosis present

## 2023-11-18 MED ORDER — OXYCODONE HCL 5 MG PO TABS: 5.0000 mg | ORAL_TABLET | Freq: Four times a day (QID) | ORAL | 0 refills | Status: AC | PRN

## 2023-11-18 MED ORDER — LIDOCAINE 5 % EX PTCH
1.0000 | MEDICATED_PATCH | CUTANEOUS | Status: DC
  Administered 2023-11-18: 1 via TRANSDERMAL
  Filled 2023-11-18: qty 1

## 2023-11-18 NOTE — ED Provider Notes (Incomplete)
 [place] [facility] Provider Note   CSN: [id number] Arrival date & time: 11/18/23  1919     History {Add pertinent medical, surgical, social history, OB history to HPI:1} Chief Complaint  Patient presents with  . Back Pain    Brittany Tran is a 63 y.o. female.   Back Pain      Home Medications Prior to Admission medications   Medication Sig Start Date End Date Taking? Authorizing Provider  acetaminophen  (TYLENOL ) 325 MG tablet Take 2 tablets (650 mg total) by mouth every 6 (six) hours as needed for mild pain or fever. 05/21/18   Colin Dawley, MD  citalopram  (CELEXA ) 10 MG tablet TAKE 1 TABLET(10 MG) BY MOUTH DAILY 10/02/23   Jodi Munroe, NP  Docusate Sodium  (DSS) 100 MG CAPS Take by mouth. 03/07/22   [provider]  folic acid  (FOLVITE ) 1 MG tablet Take by mouth.    [provider]  furosemide  (LASIX ) 20 MG tablet Take 1 tablet (20 mg total) by mouth daily. Patient not taking: Reported on 01/24/2023 02/14/22 02/14/23  Pokhrel, Laxman, MD  gabapentin  (NEURONTIN ) 300 MG capsule Take 1 capsule (300 mg total) by mouth 3 (three) times daily. 07/17/23   Jodi Munroe, NP  glucose blood (TRUE METRIX BLOOD GLUCOSE TEST) test strip Use as instructed 06/22/18   Vernell Goldsmith, MD  hydrocerin (EUCERIN) CREA Apply 1 Application topically 2 (two) times daily. To dry skin and areas of scaling 02/12/22   Pokhrel, Amador Bad, MD  methocarbamol  (ROBAXIN ) 500 MG tablet TAKE 1 TABLET(500 MG) BY MOUTH DAILY AS NEEDED FOR MUSCLE SPASMS 09/30/22   Raulkar, Keven Pel, MD  methotrexate (RHEUMATREX) 2.5 MG tablet Take by mouth. 07/05/19   [provider]  Multiple Vitamin (MULTIVITAMIN WITH MINERALS) TABS tablet Take 1 tablet by mouth daily. 02/13/22   Pokhrel, Laxman, MD  oxyCODONE -acetaminophen  (PERCOCET) 5-325 MG tablet Take 1 tablet by mouth 2 (two) times daily as needed for moderate pain (pain score 4-6). 09/24/23   Jodi Munroe,  NP  polyethylene glycol (MIRALAX  / GLYCOLAX ) 17 g packet Take 17 g by mouth daily as needed for moderate constipation or mild constipation. 02/12/22   Pokhrel, Amador Bad, MD  predniSONE  (DELTASONE ) 1 MG tablet Take by mouth. 05/20/21   [provider]  traZODone  (DESYREL ) 50 MG tablet Take 1 tablet (50 mg total) by mouth at bedtime as needed for sleep. 07/17/23   Jodi Munroe, NP  TRUEPLUS LANCETS 28G MISC Use to check blood sugar daily. 06/22/18   Vernell Goldsmith, MD  Vitamin D , Ergocalciferol , (DRISDOL ) 1.25 MG (50000 UNIT) CAPS capsule Take 1 capsule (50,000 Units total) by mouth every 7 (seven) days. 12/26/21   Raulkar, Krutika P, MD  warfarin (COUMADIN ) 2 MG tablet SMARTSIG:1 Tablet(s) By Mouth Every Evening    [provider]  warfarin (COUMADIN ) 5 MG tablet Take by mouth. 07/03/22 10/01/22  [provider]      Allergies    Penicillins, Vancomycin , Cefazolin , Penicillin g benzathine, and Sulfa  antibiotics    Review of Systems   Review of Systems  Musculoskeletal:  Positive for back pain.    Physical Exam Updated Vital Signs BP 129/85 (BP Location: Right Arm)   Pulse 80   Temp 97.8 F (36.6 C) (Oral)   Resp 19   Ht 5\' 6"  (1.676 m)   Wt 129.1 kg   SpO2 98%   BMI 45.94 kg/m  Physical Exam  ED Results /  Procedures / Treatments   Labs (all labs ordered are listed, but only abnormal results are displayed) Labs Reviewed - No data to display  EKG None  Radiology No results found.  Procedures Procedures  {Document cardiac monitor, telemetry assessment procedure when appropriate:1}  Medications Ordered in ED Medications - No data to display  ED Course/ Medical Decision Making/ A&P   {   Click here for ABCD2, HEART and other calculatorsREFRESH Note before signing :1}                              Medical Decision Making  ***  {Document critical care time when appropriate:1} {Document review of labs and clinical decision tools ie heart  score, Chads2Vasc2 etc:1}  {Document your independent review of radiology images, and any outside records:1} {Document your discussion with family members, caretakers, and with consultants:1} {Document social determinants of health affecting pt's care:1} {Document your decision making why or why not admission, treatments were needed:1} Final Clinical Impression(s) / ED Diagnoses Final diagnoses:  None    Rx / DC Orders ED Discharge Orders     None

## 2023-11-18 NOTE — Discharge Instructions (Addendum)
 Take Oxycodone  every 6 hours for the next 5 days for your sciatica. Continue taking Gabapentin  and Tylenol  for your pain as well.  Follow up with your PCP in the next several days for reevaluation.  Return to the ED if symptoms worsen in the interim.

## 2023-11-18 NOTE — ED Provider Notes (Signed)
 [place] [facility] Provider Note   CSN: [id number] Arrival date & time: 11/18/23  1919     History  Chief Complaint  Patient presents with   Back Pain    Brittany Tran is a 63 y.o. female with history of diabetes mellitus, rheumatoid arthritis, and fibromyalgia presents the ED today for sciatic nerve pain.  Patient reports left-sided sciatic nerve pain for the past 4 days.  Pain is located at the left lateral hip and shoots down her leg.  She is able to walk with her cane, as normal.  Denies any pain to the back.  No injury or trauma prior to onset of symptoms.  No new weakness or paresthesias.  States that she has tried taking OTC analgesics as well as gabapentin  that has been prescribed to her for this in the past without improvement of symptoms.  States that she cannot sleep at night second to the pain.  No additional complaints or concerns at this time.    Home Medications Prior to Admission medications   Medication Sig Start Date End Date Taking? Authorizing Provider  oxyCODONE  (ROXICODONE ) 5 MG immediate release tablet Take 1 tablet (5 mg total) by mouth every 6 (six) hours as needed for up to 5 days for severe pain (pain score 7-10). 11/18/23 11/23/23 Yes Sonnie Dusky, PA-C  acetaminophen  (TYLENOL ) 325 MG tablet Take 2 tablets (650 mg total) by mouth every 6 (six) hours as needed for mild pain or fever. 05/21/18   Colin Dawley, MD  citalopram  (CELEXA ) 10 MG tablet TAKE 1 TABLET(10 MG) BY MOUTH DAILY 10/02/23   Jodi Munroe, NP  Docusate Sodium  (DSS) 100 MG CAPS Take by mouth. 03/07/22   [provider]  folic acid  (FOLVITE ) 1 MG tablet Take by mouth.    [provider]  furosemide  (LASIX ) 20 MG tablet Take 1 tablet (20 mg total) by mouth daily. Patient not taking: Reported on 01/24/2023 02/14/22 02/14/23  Pokhrel, Laxman, MD  gabapentin  (NEURONTIN ) 300 MG capsule Take 1 capsule (300 mg total) by mouth 3 (three) times daily.  07/17/23   Jodi Munroe, NP  glucose blood (TRUE METRIX BLOOD GLUCOSE TEST) test strip Use as instructed 06/22/18   Vernell Goldsmith, MD  hydrocerin (EUCERIN) CREA Apply 1 Application topically 2 (two) times daily. To dry skin and areas of scaling 02/12/22   Pokhrel, Laxman, MD  methocarbamol  (ROBAXIN ) 500 MG tablet TAKE 1 TABLET(500 MG) BY MOUTH DAILY AS NEEDED FOR MUSCLE SPASMS 09/30/22   Raulkar, Keven Pel, MD  methotrexate (RHEUMATREX) 2.5 MG tablet Take by mouth. 07/05/19   [provider]  Multiple Vitamin (MULTIVITAMIN WITH MINERALS) TABS tablet Take 1 tablet by mouth daily. 02/13/22   Pokhrel, Laxman, MD  oxyCODONE -acetaminophen  (PERCOCET) 5-325 MG tablet Take 1 tablet by mouth 2 (two) times daily as needed for moderate pain (pain score 4-6). 09/24/23   Jodi Munroe, NP  polyethylene glycol (MIRALAX  / GLYCOLAX ) 17 g packet Take 17 g by mouth daily as needed for moderate constipation or mild constipation. 02/12/22   Pokhrel, Amador Bad, MD  predniSONE  (DELTASONE ) 1 MG tablet Take by mouth. 05/20/21   [provider]  traZODone  (DESYREL ) 50 MG tablet Take 1 tablet (50 mg total) by mouth at bedtime as needed for sleep. 07/17/23   Jodi Munroe, NP  TRUEPLUS LANCETS 28G MISC Use to check blood sugar daily. 06/22/18   Vernell Goldsmith, MD  Vitamin D , Ergocalciferol , (DRISDOL ) 1.25 MG (  50000 UNIT) CAPS capsule Take 1 capsule (50,000 Units total) by mouth every 7 (seven) days. 12/26/21   Raulkar, Krutika P, MD  warfarin (COUMADIN ) 2 MG tablet SMARTSIG:1 Tablet(s) By Mouth Every Evening    [provider]  warfarin (COUMADIN ) 5 MG tablet Take by mouth. 07/03/22 10/01/22  [provider]      Allergies    Penicillins, Vancomycin , Cefazolin , Penicillin g benzathine, and Sulfa  antibiotics    Review of Systems   Review of Systems  Musculoskeletal:  Positive for arthralgias.  All other systems reviewed and are negative.   Physical Exam Updated Vital Signs BP 129/85  (BP Location: Right Arm)   Pulse 80   Temp 97.8 F (36.6 C) (Oral)   Resp 19   Ht 5\' 6"  (1.676 m)   Wt 129.1 kg   SpO2 98%   BMI 45.94 kg/m  Physical Exam Vitals and nursing note reviewed.  Constitutional:      General: She is not in acute distress.    Appearance: Normal appearance.  HENT:     Head: Normocephalic and atraumatic.     Mouth/Throat:     Mouth: Mucous membranes are moist.  Eyes:     Conjunctiva/sclera: Conjunctivae normal.     Pupils: Pupils are equal, round, and reactive to light.  Cardiovascular:     Rate and Rhythm: Normal rate and regular rhythm.     Pulses: Normal pulses.     Heart sounds: Normal heart sounds.  Pulmonary:     Effort: Pulmonary effort is normal.     Breath sounds: Normal breath sounds.  Abdominal:     Palpations: Abdomen is soft.     Tenderness: There is no abdominal tenderness.  Musculoskeletal:        General: Tenderness present. Normal range of motion.     Cervical back: Normal range of motion.     Comments: No midline tenderness to palpation of the spine. Left sided lateral hip tenderness to palpation. ROM and strength intact bilaterally.  Skin:    General: Skin is warm and dry.     Findings: No rash.  Neurological:     General: No focal deficit present.     Mental Status: She is alert.     Sensory: No sensory deficit.     Motor: No weakness.  Psychiatric:        Mood and Affect: Mood normal.        Behavior: Behavior normal.     ED Results / Procedures / Treatments   Labs (all labs ordered are listed, but only abnormal results are displayed) Labs Reviewed - No data to display  EKG None  Radiology No results found.  Procedures Procedures    Medications Ordered in ED Medications  lidocaine  (LIDODERM ) 5 % 1 patch (1 patch Transdermal Patch Applied 11/18/23 2344)    ED Course/ Medical Decision Making/ A&P                                 Medical Decision Making  This patient presents to the ED for concern of  sciatica, this involves an extensive number of treatment options, and is a complaint that carries with it a high risk of complications and morbidity.   Differential diagnosis includes: Pain second rheumatoid arthritis, fibromyalgia, sciatica, muscle strain, etc.   Comorbidities  See HPI above   Additional History  Additional history obtained from prior records   Problem  List / ED Course / Critical Interventions / Medication Management  She reports left lateral hip pain shooting down her left leg for the past 4 days.  States that this is similar to her sciatic nerve pain she has had in the past.  Has been taking OTC analgesics and gabapentin  which has been prescribed to her for this in the past without improvement. Since patient drove here herself and does not have been pick her up, lidocaine  patch given for pain in the ED.  Prescription of oxycodone  sent to the pharmacy for patient to pick up in the morning.  States that she does not want to drive to one of the [facility] pharmacies, as they are too far away from her and she does not want to drive in the dark.   Social Determinants of Health  Physical activity   Test / Admission - Considered  Patient is stable and safe for discharge home.  Advise close PCP follow-up for reevaluation. Return precautions given.       Final Clinical Impression(s) / ED Diagnoses Final diagnoses:  Sciatica of left side    Rx / DC Orders ED Discharge Orders          Ordered    oxyCODONE  (ROXICODONE ) 5 MG immediate release tablet  Every 6 hours PRN        11/18/23 2330              Sonnie Dusky, PA-C 11/19/23 0041    Rosealee Concha, MD 11/19/23 1510

## 2023-11-18 NOTE — ED Triage Notes (Signed)
 Pt states left side sciatic nerve pain since Saturday  Pain worse with walking and laying down  Took otc pain meds with no relief

## 2023-11-19 ENCOUNTER — Encounter: Admitting: Registered Nurse

## 2023-12-28 ENCOUNTER — Other Ambulatory Visit: Payer: Self-pay | Admitting: Physical Medicine and Rehabilitation

## 2023-12-29 ENCOUNTER — Other Ambulatory Visit: Payer: Self-pay

## 2023-12-30 NOTE — Telephone Encounter (Signed)
 There was a refill from the pharmacy for Trazodone  attached.

## 2024-01-21 ENCOUNTER — Other Ambulatory Visit: Payer: Self-pay | Admitting: Registered Nurse

## 2024-01-21 NOTE — Progress Notes (Signed)
 Subjective:    Patient ID: Brittany Tran, female    DOB: 10-Apr-1961, 63 y.o.   MRN: [id number]  HPI: Brittany Tran is a 63 y.o. female who returns for follow up appointment for chronic pain and medication refill. She states her pain is located in her bilateral shoulders, lower back and bilateral knee pain. She also reports generalized joint pain and Fibro Pain. She rates her pain 8. Her current exercise regime is walking and performing stretching exercises.  Brittany Tran last visit was in April 2025, she was educated she needs to keep her scheduled appointments, in order to be prescribed her Oxycodone , he verbalizes understanding. Oral Swab was Performed today, awaiting results, if consistent will prescribe Oxycodone . She verbalizes understanding .      Pain Inventory Average Pain 8 Pain Right Now 8 My pain is constant and sharp  In the last 24 hours, has pain interfered with the following? General activity 5 Relation with others 5 Enjoyment of life 5 What TIME of day is your pain at its worst? morning , daytime, evening, and night Sleep (in general) Fair  Pain is worse with: walking, bending, and standing Pain improves with: rest and medication Relief from Meds: 9  Family History  Problem Relation Age of Onset  . Diabetes Mother    Social History   Socioeconomic History  . Marital status: Single    Spouse name: Not on file  . Number of children: Not on file  . Years of education: Not on file  . Highest education level: Not on file  Occupational History  . Occupation: unemployed  Tobacco Use  . Smoking status: Never  . Smokeless tobacco: Never  Vaping Use  . Vaping status: Never Used  Substance and Sexual Activity  . Alcohol use: No  . Drug use: No  . Sexual activity: Not on file  Other Topics Concern  . Not on file  Social History Narrative  . Not on file   Social Drivers of Health   Financial Resource Strain: Not on file  Food Insecurity: Low Risk   (01/31/2023)   Received from [facility]   Hunger Vital Sign   . Within the past 12 months, you worried that your food would run out before you got money to buy more: Never true   . Within the past 12 months, the food you bought just didn't last and you didn't have money to get more. : Never true  Transportation Needs: Not on file (01/31/2023)  Physical Activity: Not on file  Stress: Not on file  Social Connections: Not on file   Past Surgical History:  Procedure Laterality Date  . HARDWARE REMOVAL Left 09/29/2014   Procedure: REMOVAL OF DEEP IMPLANT OF LEFT FIBULA  AND TIBIA (SEPARATE INCISIONS);  Surgeon: Norleen Armor, MD;  Location: [facility];  Service: Orthopedics;  Laterality: Left;  . ORIF ANKLE FRACTURE  1988   left   Past Surgical History:  Procedure Laterality Date  . HARDWARE REMOVAL Left 09/29/2014   Procedure: REMOVAL OF DEEP IMPLANT OF LEFT FIBULA  AND TIBIA (SEPARATE INCISIONS);  Surgeon: Norleen Armor, MD;  Location: [facility];  Service: Orthopedics;  Laterality: Left;  . ORIF ANKLE FRACTURE  1988   left   Past Medical History:  Diagnosis Date  . Cellulitis   . Diabetes mellitus without complication (HCC)   . Diastolic heart failure (HCC)    Echo 03/14/15 Grade I DD  . Fibromyalgia   .  History of DVT (deep vein thrombosis)   . Hypertension   . Rheumatoid arthritis (HCC)   . Sepsis (HCC) 12/2019   There were no vitals taken for this visit.  Opioid Risk Score:   Fall Risk Score:  `1  Depression screen [facility] 2/9     09/24/2023    9:21 AM 07/17/2023   10:24 AM 03/21/2023    8:52 AM 02/19/2023   10:16 AM 01/24/2023   10:05 AM 11/01/2022    8:47 AM 10/03/2022   10:10 AM  Depression screen PHQ 2/9  Decreased Interest 1 1 0 0 0 0 1  Down, Depressed, Hopeless 1 1 0 0 0 0 1  PHQ - 2 Score 2 2 0 0 0 0 2    Review of Systems  Musculoskeletal:        Fibromyalgia - Bilateral shoulder, knee & leg pain       Objective:   Physical  Exam Vitals and nursing note reviewed.  Constitutional:      Appearance: Normal appearance. She is obese.  Cardiovascular:     Rate and Rhythm: Regular rhythm. Tachycardia present.     Pulses: Normal pulses.     Heart sounds: Normal heart sounds.  Pulmonary:     Effort: Pulmonary effort is normal.     Breath sounds: Normal breath sounds.  Musculoskeletal:     Comments: Normal Muscle Bulk and Muscle Testing Reveals:  Upper Extremities: Decreased ROM 45 Degrees  and Muscle Strength 4/5 Bilateral AC Joint Tenderness Lumbar Paraspinal Tenderness: L-3-L-5 Lower Extremities : Full ROM and Muscle Strength 5/5 Right Lower Extremity Flexion Produces Pain in her Right Patella  Arises from Table slowly using cane for support  Antalgic Gait     Skin:    General: Skin is warm and dry.  Neurological:     Mental Status: She is alert and oriented to person, place, and time.  Psychiatric:        Mood and Affect: Mood normal.        Behavior: Behavior normal.          Assessment & Plan:  Chronic Bilateral Thoracic Back Pain/ Chronic Low Back Pain without Sciatica: Continue HEP as tolerated. Continue Gabapentin . Continue to Monitor. 01/24/2024 Fibromyalgia: Continue HEP as tolerated. Continue Gabapentin . Continue to Monitor. 01/24/2024 Rheumatoid Arthritis: Rheumatology Following. 01/24/2024 Chronic Pain Syndrome: Oral Swab was Performed: if consistent: Oxycodone  will be resumed. We will continue the opioid monitoring program, this consists of regular clinic visits, examinations, urine drug screen, pill counts as well as use of [place]  Controlled Substance Reporting system. A 12 month History has been reviewed on the [place]  Controlled Substance Reporting System 01/24/2024 5. Cervicalgia/ Cervical Radiculitis: No complaints today. Continue Gabapentin . Continue to Monitor. 01/24/2024 6. Myofascial Pain Syndrome: Continue Methocarbamol  . Continue HEP as Tolerated. Continue to  Monitor. 01/24/2024. 7 Insomnia: Continue Trazodone . Continue to monitor. 01/24/2024   F/U in 2 months

## 2024-01-23 ENCOUNTER — Encounter: Attending: Registered Nurse | Admitting: Registered Nurse

## 2024-01-23 VITALS — BP 108/70 | HR 104 | Ht 66.0 in | Wt 306.0 lb

## 2024-01-23 DIAGNOSIS — M7918 Myalgia, other site: Secondary | ICD-10-CM | POA: Insufficient documentation

## 2024-01-23 DIAGNOSIS — M25512 Pain in left shoulder: Secondary | ICD-10-CM | POA: Insufficient documentation

## 2024-01-23 DIAGNOSIS — G894 Chronic pain syndrome: Secondary | ICD-10-CM | POA: Insufficient documentation

## 2024-01-23 DIAGNOSIS — Z79899 Other long term (current) drug therapy: Secondary | ICD-10-CM | POA: Diagnosis present

## 2024-01-23 DIAGNOSIS — M797 Fibromyalgia: Secondary | ICD-10-CM | POA: Insufficient documentation

## 2024-01-23 DIAGNOSIS — G8929 Other chronic pain: Secondary | ICD-10-CM | POA: Insufficient documentation

## 2024-01-23 DIAGNOSIS — M7061 Trochanteric bursitis, right hip: Secondary | ICD-10-CM | POA: Insufficient documentation

## 2024-01-23 DIAGNOSIS — M545 Low back pain, unspecified: Secondary | ICD-10-CM | POA: Diagnosis present

## 2024-01-23 DIAGNOSIS — M2550 Pain in unspecified joint: Secondary | ICD-10-CM | POA: Insufficient documentation

## 2024-01-23 DIAGNOSIS — M25511 Pain in right shoulder: Secondary | ICD-10-CM | POA: Insufficient documentation

## 2024-01-23 DIAGNOSIS — Z5181 Encounter for therapeutic drug level monitoring: Secondary | ICD-10-CM | POA: Diagnosis present

## 2024-01-23 DIAGNOSIS — M7062 Trochanteric bursitis, left hip: Secondary | ICD-10-CM | POA: Diagnosis present

## 2024-01-24 ENCOUNTER — Encounter: Payer: Self-pay | Admitting: Registered Nurse

## 2024-01-28 ENCOUNTER — Telehealth: Payer: Self-pay | Admitting: Registered Nurse

## 2024-01-28 LAB — DRUG TOX MONITOR 1 W/CONF, ORAL FLD

## 2024-01-28 LAB — DRUG TOX ALC METAB W/CON, ORAL FLD: Alcohol Metabolite: NEGATIVE ng/mL (ref <25)

## 2024-01-28 MED ORDER — OXYCODONE-ACETAMINOPHEN 5-325 MG PO TABS: 1.0000 | ORAL_TABLET | Freq: Two times a day (BID) | ORAL | 0 refills | Status: DC | PRN

## 2024-01-28 NOTE — Telephone Encounter (Signed)
PMP was Reviewed.  UDS was Reviewed.  Oxycodone e-scribed to pharmacy

## 2024-02-02 ENCOUNTER — Emergency Department (HOSPITAL_BASED_OUTPATIENT_CLINIC_OR_DEPARTMENT_OTHER)
Admission: EM | Admit: 2024-02-02 | Discharge: 2024-02-03 | Disposition: A | Discharge status: Home / Self Care | Attending: Emergency Medicine | Admitting: Emergency Medicine

## 2024-02-02 ENCOUNTER — Encounter (HOSPITAL_BASED_OUTPATIENT_CLINIC_OR_DEPARTMENT_OTHER): Payer: Self-pay | Admitting: Emergency Medicine

## 2024-02-02 ENCOUNTER — Other Ambulatory Visit: Payer: Self-pay

## 2024-02-02 ENCOUNTER — Emergency Department (HOSPITAL_BASED_OUTPATIENT_CLINIC_OR_DEPARTMENT_OTHER)

## 2024-02-02 DIAGNOSIS — Z7901 Long term (current) use of anticoagulants: Secondary | ICD-10-CM | POA: Insufficient documentation

## 2024-02-02 DIAGNOSIS — R0789 Other chest pain: Secondary | ICD-10-CM | POA: Diagnosis present

## 2024-02-02 MED ORDER — KETOROLAC TROMETHAMINE 15 MG/ML IJ SOLN
15.0000 mg | Freq: Once | INTRAMUSCULAR | Status: AC
  Administered 2024-02-02: 15 mg via INTRAMUSCULAR
  Filled 2024-02-02: qty 1

## 2024-02-02 MED ORDER — CYCLOBENZAPRINE HCL 10 MG PO TABS
10.0000 mg | ORAL_TABLET | Freq: Two times a day (BID) | ORAL | 0 refills | Status: AC | PRN
Start: 2024-02-02 — End: ?

## 2024-02-02 NOTE — ED Provider Notes (Signed)
 [place] [facility]  Provider Note  CSN: [id number] Arrival date & time: 02/02/24 2220  History Chief Complaint  Patient presents with   Chest Pain    Right rib pain     Brittany Tran is a 63 y.o. female with history of chronic pain/fibromyalgia followed by Pain Management reports 2-3 weeks of R chest wall pain after walking to her car fast a few weeks ago. No SOB, cough, fever. She has seen her Pain clinic since then and had a refill of oxycodone  sent to pharmacy a few days ago but she has not been able to pick it up yet. She has remote history of PE, on chronic warfarin, last check a week ago was therapeutic.    Home Medications Prior to Admission medications   Medication Sig Start Date End Date Taking? Authorizing Provider  cyclobenzaprine  (FLEXERIL ) 10 MG tablet Take 1 tablet (10 mg total) by mouth 2 (two) times daily as needed for muscle spasms. 02/02/24  Yes Roselyn Carlin NOVAK, MD  acetaminophen  (TYLENOL ) 325 MG tablet Take 2 tablets (650 mg total) by mouth every 6 (six) hours as needed for mild pain or fever. 05/21/18   Pearlean Manus, MD  citalopram  (CELEXA ) 10 MG tablet TAKE 1 TABLET(10 MG) BY MOUTH DAILY 01/21/24   Debby Fidela CROME, NP  Docusate Sodium  (DSS) 100 MG CAPS Take by mouth. 03/07/22   [provider]  folic acid  (FOLVITE ) 1 MG tablet Take by mouth.    [provider]  furosemide  (LASIX ) 20 MG tablet Take 1 tablet (20 mg total) by mouth daily. 02/14/22 01/23/24  Pokhrel, Laxman, MD  gabapentin  (NEURONTIN ) 300 MG capsule Take 1 capsule (300 mg total) by mouth 3 (three) times daily. 07/17/23   Debby Fidela CROME, NP  glucose blood (TRUE METRIX BLOOD GLUCOSE TEST) test strip Use as instructed 06/22/18   Brien Belvie BRAVO, MD  hydrocerin (EUCERIN) CREA Apply 1 Application topically 2 (two) times daily. To dry skin and areas of scaling 02/12/22   Pokhrel, Vernal, MD  methotrexate (RHEUMATREX) 2.5 MG tablet Take by mouth.  07/05/19   [provider]  Multiple Vitamin (MULTIVITAMIN WITH MINERALS) TABS tablet Take 1 tablet by mouth daily. 02/13/22   Pokhrel, Laxman, MD  oxyCODONE -acetaminophen  (PERCOCET) 5-325 MG tablet Take 1 tablet by mouth 2 (two) times daily as needed for moderate pain (pain score 4-6). 01/28/24   Debby Fidela CROME, NP  polyethylene glycol (MIRALAX  / GLYCOLAX ) 17 g packet Take 17 g by mouth daily as needed for moderate constipation or mild constipation. 02/12/22   Pokhrel, Vernal, MD  predniSONE  (DELTASONE ) 1 MG tablet Take by mouth. 05/20/21   [provider]  traZODone  (DESYREL ) 50 MG tablet Take 1 tablet (50 mg total) by mouth at bedtime as needed for sleep. 07/17/23   Debby Fidela CROME, NP  TRUEPLUS LANCETS 28G MISC Use to check blood sugar daily. 06/22/18   Brien Belvie BRAVO, MD  Vitamin D , Ergocalciferol , (DRISDOL ) 1.25 MG (50000 UNIT) CAPS capsule Take 1 capsule (50,000 Units total) by mouth every 7 (seven) days. 12/26/21   Raulkar, Krutika P, MD  warfarin (COUMADIN ) 2 MG tablet SMARTSIG:1 Tablet(s) By Mouth Every Evening    [provider]  warfarin (COUMADIN ) 5 MG tablet Take by mouth. 07/03/22 01/23/24  [provider]     Allergies    Penicillins, Vancomycin , Cefazolin , Penicillin g benzathine, and Sulfa  antibiotics   Review of Systems   Review of Systems Please  see HPI for pertinent positives and negatives  Physical Exam BP (!) 170/83 (BP Location: Right Arm)   Pulse 100   Temp 98.4 F (36.9 C) (Oral)   Resp 16   Ht 5' 6 (1.676 m)   Wt (!) 138.8 kg   SpO2 94%   BMI 49.39 kg/m   Physical Exam Vitals and nursing note reviewed.  Constitutional:      Appearance: Normal appearance.  HENT:     Head: Normocephalic and atraumatic.     Nose: Nose normal.     Mouth/Throat:     Mouth: Mucous membranes are moist.  Eyes:     Extraocular Movements: Extraocular movements intact.     Conjunctiva/sclera: Conjunctivae normal.  Cardiovascular:     Rate  and Rhythm: Normal rate.  Pulmonary:     Effort: Pulmonary effort is normal.     Breath sounds: Normal breath sounds.  Chest:     Chest wall: Tenderness (R lateral chest) present.  Abdominal:     General: Abdomen is flat.     Palpations: Abdomen is soft.     Tenderness: There is no abdominal tenderness.  Musculoskeletal:        General: No swelling. Normal range of motion.     Cervical back: Neck supple.  Skin:    General: Skin is warm and dry.  Neurological:     General: No focal deficit present.     Mental Status: She is alert.  Psychiatric:        Mood and Affect: Mood normal.     ED Results / Procedures / Treatments   EKG None  Procedures Procedures  Medications Ordered in the ED Medications  ketorolac  (TORADOL ) 15 MG/ML injection 15 mg (15 mg Intramuscular Given 02/02/24 2350)    Initial Impression and Plan  Patient here with MSK chest wall pain, I personally viewed the images from radiology studies and agree with radiologist interpretation: Xray neg for fracture or other acute pulm process. Will give a dose of toradol  here but advised to avoid long term NSAID use while on warfarin. She is requesting muscle relaxer Rx, advised to avoid mixing sedating medications but encouraged to get her oxycodone  Rx filled as soon as she is able.   ED Course       MDM Rules/Calculators/A&P Medical Decision Making Problems Addressed: Chest wall pain: chronic illness or injury with exacerbation, progression, or side effects of treatment  Amount and/or Complexity of Data Reviewed Radiology: ordered and independent interpretation performed. Decision-making details documented in ED Course.  Risk Prescription drug management.     Final Clinical Impression(s) / ED Diagnoses Final diagnoses:  Chest wall pain    Rx / DC Orders ED Discharge Orders          Ordered    cyclobenzaprine  (FLEXERIL ) 10 MG tablet  2 times daily PRN        02/02/24 2347              Roselyn Carlin NOVAK, MD 02/02/24 2352

## 2024-02-02 NOTE — ED Triage Notes (Addendum)
 Pt c/o right rib pain x 3 weeks, has not been seen, pt with chronic pain

## 2024-02-12 ENCOUNTER — Emergency Department (HOSPITAL_BASED_OUTPATIENT_CLINIC_OR_DEPARTMENT_OTHER)
Admission: EM | Admit: 2024-02-12 | Discharge: 2024-02-12 | Disposition: A | Discharge status: Home / Self Care | Attending: Emergency Medicine | Admitting: Emergency Medicine

## 2024-02-12 ENCOUNTER — Encounter (HOSPITAL_BASED_OUTPATIENT_CLINIC_OR_DEPARTMENT_OTHER): Payer: Self-pay | Admitting: Emergency Medicine

## 2024-02-12 ENCOUNTER — Other Ambulatory Visit: Payer: Self-pay

## 2024-02-12 ENCOUNTER — Emergency Department (HOSPITAL_BASED_OUTPATIENT_CLINIC_OR_DEPARTMENT_OTHER)

## 2024-02-12 DIAGNOSIS — M79662 Pain in left lower leg: Secondary | ICD-10-CM | POA: Insufficient documentation

## 2024-02-12 DIAGNOSIS — R000 Tachycardia, unspecified: Secondary | ICD-10-CM | POA: Diagnosis not present

## 2024-02-12 DIAGNOSIS — Z5189 Encounter for other specified aftercare: Secondary | ICD-10-CM | POA: Insufficient documentation

## 2024-02-12 DIAGNOSIS — Z7901 Long term (current) use of anticoagulants: Secondary | ICD-10-CM | POA: Insufficient documentation

## 2024-02-12 LAB — COMPREHENSIVE METABOLIC PANEL WITH GFR
ALT: 11 U/L (ref 0–44)
AST: 31 U/L (ref 15–41)
Albumin: 3.3 g/dL — ABNORMAL LOW (ref 3.5–5.0)
Alkaline Phosphatase: 127 U/L — ABNORMAL HIGH (ref 38–126)
Anion gap: 9 (ref 5–15)
BUN: 12 mg/dL (ref 8–23)
CO2: 26 mmol/L (ref 22–32)
Calcium: 8.8 mg/dL — ABNORMAL LOW (ref 8.9–10.3)
Chloride: 103 mmol/L (ref 98–111)
Creatinine, Ser: 0.97 mg/dL (ref 0.44–1.00)
GFR, Estimated: >60 mL/min (ref >60)
Glucose, Bld: 120 mg/dL — ABNORMAL HIGH (ref 70–99)
Potassium: 4.2 mmol/L (ref 3.5–5.1)
Sodium: 138 mmol/L (ref 135–145)
Total Bilirubin: 1.5 mg/dL — ABNORMAL HIGH (ref 0.0–1.2)
Total Protein: 6.7 g/dL (ref 6.5–8.1)

## 2024-02-12 LAB — CBC WITH DIFFERENTIAL/PLATELET
Abs Immature Granulocytes: 0.02 K/uL (ref 0.00–0.07)
Basophils Absolute: 0 K/uL (ref 0.0–0.1)
Basophils Relative: 0 %
Eosinophils Absolute: 0.1 K/uL (ref 0.0–0.5)
Eosinophils Relative: 2 %
HCT: 32.5 % — ABNORMAL LOW (ref 36.0–46.0)
Hemoglobin: 10.5 g/dL — ABNORMAL LOW (ref 12.0–15.0)
Immature Granulocytes: 0 %
Lymphocytes Relative: 17 %
Lymphs Abs: 0.9 K/uL (ref 0.7–4.0)
MCH: 31.9 pg (ref 26.0–34.0)
MCHC: 32.3 g/dL (ref 30.0–36.0)
MCV: 98.8 fL (ref 80.0–100.0)
Monocytes Absolute: 0.1 K/uL (ref 0.1–1.0)
Monocytes Relative: 1 %
Neutro Abs: 4.3 K/uL (ref 1.7–7.7)
Neutrophils Relative %: 80 %
Platelets: 128 K/uL — ABNORMAL LOW (ref 150–400)
RBC: 3.29 MIL/uL — ABNORMAL LOW (ref 3.87–5.11)
RDW: 16.8 % — ABNORMAL HIGH (ref 11.5–15.5)
WBC: 5.4 K/uL (ref 4.0–10.5)
nRBC: 0 % (ref 0.0–0.2)

## 2024-02-12 LAB — LACTIC ACID, PLASMA: Lactic Acid, Venous: 1.2 mmol/L (ref 0.5–1.9)

## 2024-02-12 LAB — PROTIME-INR
INR: 3.8 — ABNORMAL HIGH (ref 0.8–1.2)
Prothrombin Time: 39.1 s — ABNORMAL HIGH (ref 11.4–15.2)

## 2024-02-12 MED ORDER — DOXYCYCLINE HYCLATE 100 MG PO CAPS: 100.0000 mg | ORAL_CAPSULE | Freq: Two times a day (BID) | ORAL | 0 refills | Status: AC

## 2024-02-12 NOTE — ED Notes (Signed)
 Left leg wound wrapped in xeroform, gauze, and coban.

## 2024-02-12 NOTE — ED Notes (Signed)
 Compression hose to Left lower calf and foot noted to be wet. Tissue also noted to be coming off. Sock had to be cut by staff so provider can evaluate.

## 2024-02-12 NOTE — ED Triage Notes (Signed)
 Pt arrived requiring assistance out of her car- reports LLE wound that is leaking. Currently being seen by wound care. Denies fever.   Last seen by wound care in March or April.

## 2024-02-12 NOTE — Discharge Instructions (Signed)
 I recommend that your bandages are changed at least once every 3 days.  You can follow-up for your next wound care visit, but we have also contacted our social worker to see if you are eligible for home health aide to help you exchange your bandages at home.  You should especially be changing them if they are soaked or wet.  I did prescribe an antibiotic to your pharmacy to pick up and take for possible cellulitis or skin infection.  We talked about your blood cultures that were drawn today.  If your cultures are growing positive for bacteria, you will receive a phone call telling you to come back immediately to the hospital.  You should also return to the hospital if you begin having fevers, confusion, extreme weakness or any other emergency concerns.`

## 2024-02-12 NOTE — ED Provider Notes (Signed)
 [place] [facility] Provider Note   CSN: [id number] Arrival date & time: 02/12/24  1236     Patient presents with: Wound Check   Brittany Tran is a 63 y.o. female with a history of obesity, lymphedema, chronic wounds of the lower extremities, presented to ED with complaint of pain and swelling and weeping near her wound site in her left lower leg.  Patient reports that she has difficulty caring for self at home.  She cannot recall the last time she bathed or showered.  She says she is not able to pull the socks off of her legs because she cannot reach them.  She has been going to wound care weekly to have her socks exchanged.  She said that the tissue sloughed off of her left lower leg earlier today.   HPI     Prior to Admission medications   Medication Sig Start Date End Date Taking? Authorizing Provider  doxycycline  (VIBRAMYCIN ) 100 MG capsule Take 1 capsule (100 mg total) by mouth 2 (two) times daily for 7 days. 02/12/24 02/19/24 Yes Declyn Offield, Donnice PARAS, MD  acetaminophen  (TYLENOL ) 325 MG tablet Take 2 tablets (650 mg total) by mouth every 6 (six) hours as needed for mild pain or fever. 05/21/18   Pearlean Manus, MD  citalopram  (CELEXA ) 10 MG tablet TAKE 1 TABLET(10 MG) BY MOUTH DAILY 01/21/24   Debby Fidela CROME, NP  cyclobenzaprine  (FLEXERIL ) 10 MG tablet Take 1 tablet (10 mg total) by mouth 2 (two) times daily as needed for muscle spasms. 02/02/24   Roselyn Carlin NOVAK, MD  Docusate Sodium  (DSS) 100 MG CAPS Take by mouth. 03/07/22   [provider]  folic acid  (FOLVITE ) 1 MG tablet Take by mouth.    [provider]  furosemide  (LASIX ) 20 MG tablet Take 1 tablet (20 mg total) by mouth daily. 02/14/22 01/23/24  Pokhrel, Laxman, MD  gabapentin  (NEURONTIN ) 300 MG capsule Take 1 capsule (300 mg total) by mouth 3 (three) times daily. 07/17/23   Debby Fidela CROME, NP  glucose blood (TRUE METRIX BLOOD GLUCOSE TEST) test strip Use as instructed  06/22/18   Brien Belvie BRAVO, MD  hydrocerin (EUCERIN) CREA Apply 1 Application topically 2 (two) times daily. To dry skin and areas of scaling 02/12/22   Pokhrel, Vernal, MD  methotrexate (RHEUMATREX) 2.5 MG tablet Take by mouth. 07/05/19   [provider]  Multiple Vitamin (MULTIVITAMIN WITH MINERALS) TABS tablet Take 1 tablet by mouth daily. 02/13/22   Pokhrel, Laxman, MD  oxyCODONE -acetaminophen  (PERCOCET) 5-325 MG tablet Take 1 tablet by mouth 2 (two) times daily as needed for moderate pain (pain score 4-6). 01/28/24   Debby Fidela CROME, NP  polyethylene glycol (MIRALAX  / GLYCOLAX ) 17 g packet Take 17 g by mouth daily as needed for moderate constipation or mild constipation. 02/12/22   Pokhrel, Vernal, MD  predniSONE  (DELTASONE ) 1 MG tablet Take by mouth. 05/20/21   [provider]  traZODone  (DESYREL ) 50 MG tablet Take 1 tablet (50 mg total) by mouth at bedtime as needed for sleep. 07/17/23   Debby Fidela CROME, NP  TRUEPLUS LANCETS 28G MISC Use to check blood sugar daily. 06/22/18   Brien Belvie BRAVO, MD  Vitamin D , Ergocalciferol , (DRISDOL ) 1.25 MG (50000 UNIT) CAPS capsule Take 1 capsule (50,000 Units total) by mouth every 7 (seven) days. 12/26/21   Raulkar, Krutika P, MD  warfarin (COUMADIN ) 2 MG tablet SMARTSIG:1 Tablet(s) By Mouth Every Evening    [provider]  warfarin (COUMADIN ) 5 MG tablet Take by mouth. 07/03/22 01/23/24  [provider]    Allergies: Penicillins, Vancomycin , Cefazolin , Penicillin g benzathine, and Sulfa  antibiotics    Review of Systems  Updated Vital Signs BP 115/69   Pulse (!) 108   Temp 98.8 F (37.1 C) (Oral)   Resp 15   Ht 5' 6 (1.676 m)   Wt 136.1 kg   SpO2 94%   BMI 48.42 kg/m   Physical Exam Constitutional:      General: She is not in acute distress. HENT:     Head: Normocephalic and atraumatic.  Eyes:     Conjunctiva/sclera: Conjunctivae normal.     Pupils: Pupils are equal, round, and reactive to light.   Cardiovascular:     Rate and Rhythm: Normal rate and regular rhythm.  Pulmonary:     Effort: Pulmonary effort is normal. No respiratory distress.  Abdominal:     General: There is no distension.     Tenderness: There is no abdominal tenderness.  Musculoskeletal:     Comments: Brawny edema of the lower extremities with bark-like exofoliation bilaterally; left lower extremity with pitting edema and sloughing of outer skin, weeping pink granulation tissue; strongly malodorous  Skin:    General: Skin is warm and dry.  Neurological:     General: No focal deficit present.     Mental Status: She is alert. Mental status is at baseline.  Psychiatric:        Mood and Affect: Mood normal.        Behavior: Behavior normal.     (all labs ordered are listed, but only abnormal results are displayed) Labs Reviewed  COMPREHENSIVE METABOLIC PANEL WITH GFR - Abnormal; Notable for the following components:      Result Value   Glucose, Bld 120 (*)    Calcium 8.8 (*)    Albumin 3.3 (*)    Alkaline Phosphatase 127 (*)    Total Bilirubin 1.5 (*)    All other components within normal limits  CBC WITH DIFFERENTIAL/PLATELET - Abnormal; Notable for the following components:   RBC 3.29 (*)    Hemoglobin 10.5 (*)    HCT 32.5 (*)    RDW 16.8 (*)    Platelets 128 (*)    All other components within normal limits  PROTIME-INR - Abnormal; Notable for the following components:   Prothrombin Time 39.1 (*)    INR 3.8 (*)    All other components within normal limits  CULTURE, BLOOD (ROUTINE X 2)  CULTURE, BLOOD (ROUTINE X 2)  LACTIC ACID, PLASMA    EKG: None  Radiology: DG Tibia/Fibula Left Result Date: 02/12/2024 CLINICAL DATA:  Left lower extremity wound. EXAM: LEFT TIBIA AND FIBULA - 2 VIEW COMPARISON:  May 18, 2018. FINDINGS: There is no evidence of fracture or other focal bone lesions. Vascular calcifications are noted. IMPRESSION: Negative. Electronically Signed   By: Lynwood Landy Raddle M.D.    On: 02/12/2024 14:11     Procedures   Medications Ordered in the ED - No data to display  Clinical Course as of 02/12/24 1453  Thu Feb 12, 2024  1402 Labs unremarkable [MT]    Clinical Course User Index [MT] Cottie Donnice PARAS, MD                                 Medical Decision Making Amount and/or Complexity of Data Reviewed Labs: ordered.  Radiology: ordered.  Risk Prescription drug management.   Patient is here with concern for worsening of lower extremity chronic wound.  Differential would include cellulitis versus other  There is a significant malodor to the patient's wound today.  Is difficult to discern whether this is truly from the wound or the patient's inability to bathe or wash herself at home  Lower suspicion for necrotizing fasciitis.  Afebrile, no crepitus on exam.  I reviewed the patient's external records, notable for chronic pain syndrome, for which she sees [facility] physician, myofascial pain syndrome, chronic anticoagulation with Coumadin , venous stasis ulceration of the lower extremities  I reviewed the patient's labs, personally as well as her imaging, notable for no emergent findings.  Lactate white blood cell count are normal.  There is no indication here that the patient has sepsis.  She had blood cultures drawn from triage order sets, but have a fairly low suspicion for bacteremia I do not think she needs medical admission for IV antibiotics.  I do think she would benefit from wound care at home, or home health, therefore I have consulted social work for this issue.  Otherwise she has another wound care appointment next week.  I explained the importance of trying to change her bandages at least once every 3 days, particularly if they are getting soaked through with her edema.  We can put her on doxycycline  for 7 days as a precaution to cover for potential cellulitis, given how raw the wound area is.  But I do not see evidence of streaking erythema extending up  or down the extremity to require hospital observation or antibiotics at this time.  The patient is comfortable with this plan  Patient has a history of sinus tachycardia in the past including per my review of her EKG from July 2021 in August 2023.  I suspect this is likely close to her resting heart rate.     Final diagnoses:  Visit for wound check  Tachycardia    ED Discharge Orders          Ordered    doxycycline  (VIBRAMYCIN ) 100 MG capsule  2 times daily        02/12/24 1450               Cottie Donnice PARAS, MD 02/12/24 1453

## 2024-02-17 LAB — CULTURE, BLOOD (ROUTINE X 2): Culture: NO GROWTH

## 2024-02-18 ENCOUNTER — Telehealth: Payer: Self-pay | Admitting: Registered Nurse

## 2024-02-18 ENCOUNTER — Telehealth: Payer: Self-pay

## 2024-02-18 NOTE — Telephone Encounter (Signed)
 Patient calling into office requesting a refill on Oxycodone .  Patient was last seen on 01/23/24.  Last refill was on 01/28/24.  Patient has follow up appointment on 03/26/24.  Please advise if refill is deemed appropriate and patient will be notified accordingly

## 2024-02-18 NOTE — Telephone Encounter (Signed)
 Call placed to Ms. Rumple, no answer.  Left message to return the call.  When Ms. Czerwinski return the call ask, how often is she taking her Oxycodone .

## 2024-02-20 ENCOUNTER — Telehealth: Payer: Self-pay | Admitting: Registered Nurse

## 2024-02-20 DIAGNOSIS — M545 Low back pain, unspecified: Secondary | ICD-10-CM

## 2024-02-20 DIAGNOSIS — G894 Chronic pain syndrome: Secondary | ICD-10-CM

## 2024-02-20 MED ORDER — OXYCODONE-ACETAMINOPHEN 5-325 MG PO TABS: 1.0000 | ORAL_TABLET | Freq: Two times a day (BID) | ORAL | 0 refills | Status: DC | PRN

## 2024-02-20 NOTE — Telephone Encounter (Signed)
 Patient called requesting to speak to Fidela Pinon NP. No details given. I called back for more details, twice. There was no answer or voicemail ID.

## 2024-02-20 NOTE — Telephone Encounter (Signed)
 PMP was Reviewed.  Oxycodone  e-scribed to pharmacy.  Call placed to Brittany Tran regarding the above, she verbalizes understanding.

## 2024-02-23 ENCOUNTER — Telehealth: Payer: Self-pay | Admitting: Registered Nurse

## 2024-02-23 ENCOUNTER — Other Ambulatory Visit: Payer: Self-pay

## 2024-02-23 NOTE — Telephone Encounter (Signed)
 Call Placed to Brittany Tran, she reports she doesn't need her gabapentin  refilled. She was instructed to call office when she needs a refill. She verbalizes understanding.

## 2024-03-26 ENCOUNTER — Encounter: Payer: Self-pay | Admitting: Registered Nurse

## 2024-03-29 ENCOUNTER — Encounter: Payer: Self-pay | Attending: Registered Nurse | Admitting: Registered Nurse

## 2024-03-29 VITALS — BP 112/80 | HR 88 | Ht 66.0 in | Wt 280.0 lb

## 2024-03-29 DIAGNOSIS — M2550 Pain in unspecified joint: Secondary | ICD-10-CM | POA: Insufficient documentation

## 2024-03-29 DIAGNOSIS — G8929 Other chronic pain: Secondary | ICD-10-CM | POA: Insufficient documentation

## 2024-03-29 DIAGNOSIS — M545 Low back pain, unspecified: Secondary | ICD-10-CM | POA: Insufficient documentation

## 2024-03-29 DIAGNOSIS — Z5181 Encounter for therapeutic drug level monitoring: Secondary | ICD-10-CM | POA: Insufficient documentation

## 2024-03-29 DIAGNOSIS — G894 Chronic pain syndrome: Secondary | ICD-10-CM | POA: Insufficient documentation

## 2024-03-29 DIAGNOSIS — M7918 Myalgia, other site: Secondary | ICD-10-CM | POA: Insufficient documentation

## 2024-03-29 DIAGNOSIS — M25511 Pain in right shoulder: Secondary | ICD-10-CM | POA: Insufficient documentation

## 2024-03-29 DIAGNOSIS — M797 Fibromyalgia: Secondary | ICD-10-CM | POA: Insufficient documentation

## 2024-03-29 DIAGNOSIS — M25512 Pain in left shoulder: Secondary | ICD-10-CM | POA: Insufficient documentation

## 2024-03-29 DIAGNOSIS — G4709 Other insomnia: Secondary | ICD-10-CM | POA: Insufficient documentation

## 2024-03-29 DIAGNOSIS — Z79899 Other long term (current) drug therapy: Secondary | ICD-10-CM | POA: Insufficient documentation

## 2024-03-29 MED ORDER — OXYCODONE-ACETAMINOPHEN 5-325 MG PO TABS
1.0000 | ORAL_TABLET | Freq: Two times a day (BID) | ORAL | 0 refills | Status: DC | PRN
Start: 2024-03-29 — End: 2024-03-29

## 2024-03-29 MED ORDER — OXYCODONE-ACETAMINOPHEN 5-325 MG PO TABS: 1.0000 | ORAL_TABLET | Freq: Two times a day (BID) | ORAL | 0 refills | Status: DC | PRN

## 2024-03-29 MED ORDER — TRAZODONE HCL 50 MG PO TABS: 50.0000 mg | ORAL_TABLET | Freq: Every evening | ORAL | 1 refills | Status: AC | PRN

## 2024-03-29 NOTE — Progress Notes (Unsigned)
 Subjective:    Patient ID: Brittany Tran, female    DOB: 1960/10/15, 63 y.o.   MRN: [id number]  HPI: Brittany Tran is a 63 y.o. female who returns for follow up appointment for chronic pain and medication refill. She states her pain is located in her bilateral shoulders, lower back and reports generalize joint pain. She rates her pain 8. Her current exercise regime is walking and performing stretching exercises.  [name] Morphine equivalent is 15.00 MME.   Last Oral Swab was Performed on 01/23/2024, it was consistent.    Pain Inventory Average Pain 8 Pain Right Now 8 My pain is sharp  In the last 24 hours, has pain interfered with the following? General activity 6 Relation with others 6 Enjoyment of life 6 What TIME of day is your pain at its worst? varies Sleep (in general) Poor  Pain is worse with: bending, sitting, standing, some activites, and raining weather Pain improves with: medication Relief from Meds: 5  Family History  Problem Relation Age of Onset   Diabetes Mother    Social History   Socioeconomic History   Marital status: Single    Spouse name: Not on file   Number of children: Not on file   Years of education: Not on file   Highest education level: Not on file  Occupational History   Occupation: unemployed  Tobacco Use   Smoking status: Never   Smokeless tobacco: Never  Vaping Use   Vaping status: Never Used  Substance and Sexual Activity   Alcohol use: No   Drug use: No   Sexual activity: Not on file  Other Topics Concern   Not on file  Social History Narrative   Not on file   Social Drivers of Health   Financial Resource Strain: Not on file  Food Insecurity: Low Risk  (01/31/2023)   Received from [facility]   Hunger Vital Sign    Within the past 12 months, you worried that your food would run out before you got money to buy more: Never true    Within the past 12 months, the food you bought just didn't last and you didn't have  money to get more. : Never true  Transportation Needs: Not on file (01/31/2023)  Physical Activity: Not on file  Stress: Not on file  Social Connections: Not on file   Past Surgical History:  Procedure Laterality Date   HARDWARE REMOVAL Left 09/29/2014   Procedure: REMOVAL OF DEEP IMPLANT OF LEFT FIBULA  AND TIBIA (SEPARATE INCISIONS);  Surgeon: Norleen Armor, MD;  Location: [facility];  Service: Orthopedics;  Laterality: Left;   ORIF ANKLE FRACTURE  1988   left   Past Surgical History:  Procedure Laterality Date   HARDWARE REMOVAL Left 09/29/2014   Procedure: REMOVAL OF DEEP IMPLANT OF LEFT FIBULA  AND TIBIA (SEPARATE INCISIONS);  Surgeon: Norleen Armor, MD;  Location: [facility];  Service: Orthopedics;  Laterality: Left;   ORIF ANKLE FRACTURE  1988   left   Past Medical History:  Diagnosis Date   Cellulitis    Diabetes mellitus without complication (HCC)    Diastolic heart failure (HCC)    Echo 03/14/15 Grade I DD   Fibromyalgia    History of DVT (deep vein thrombosis)    Hypertension    Rheumatoid arthritis (HCC)    Sepsis (HCC) 12/2019   BP 112/80   Pulse 88   Ht 5' 6 (1.676 m)  Wt 280 lb (127 kg)   SpO2 98%   BMI 45.19 kg/m   Opioid Risk Score:   Fall Risk Score:  `1  Depression screen [facility] 2/9     09/24/2023    9:21 AM 07/17/2023   10:24 AM 03/21/2023    8:52 AM 02/19/2023   10:16 AM 01/24/2023   10:05 AM 11/01/2022    8:47 AM 10/03/2022   10:10 AM  Depression screen PHQ 2/9  Decreased Interest 1 1 0 0 0 0 1  Down, Depressed, Hopeless 1 1 0 0 0 0 1  PHQ - 2 Score 2 2 0 0 0 0 2      Review of Systems  Musculoskeletal:        B/L elbow pain  Right shoulder pain  All other systems reviewed and are negative.      Objective:   Physical Exam Vitals and nursing note reviewed.  Constitutional:      Appearance: Normal appearance. She is obese.  Cardiovascular:     Rate and Rhythm: Normal rate and regular rhythm.     Pulses: Normal  pulses.     Heart sounds: Normal heart sounds.  Pulmonary:     Effort: Pulmonary effort is normal.     Breath sounds: Normal breath sounds.  Musculoskeletal:     Comments: Normal Muscle Bulk and Muscle Testing Reveals:  Upper Extremities: Right: Decreased ROM 90 Degrees and Muscle Strength 5/5 Left Upper Extremity: Full ROM and Muscle Strength 5/5 Lumbar Paraspinal Tenderness: L-4-L-5 Lower Extremities: Decreased ROM and Muscle Strength 5/5 Arises from Table slowly using cane for support Antalgic  Gait     Skin:    General: Skin is warm and dry.  Neurological:     Mental Status: She is alert and oriented to person, place, and time.  Psychiatric:        Mood and Affect: Mood normal.        Behavior: Behavior normal.          Assessment & Plan:  Chronic Bilateral Thoracic Back Pain/ Chronic Low Back Pain without Sciatica: Continue HEP as tolerated. Continue Gabapentin . Continue to Monitor. 03/29/2024 Chronic Bilateral Shoulder Pain: Continue HEP as Tolerated. Continue to Monitor.  Fibromyalgia: Continue HEP as tolerated. Continue Gabapentin . Continue to Monitor. 03/29/2024 Polyarthralgia: Continue HEP as Tolerated. Continue to Monitor.  Rheumatoid Arthritis: Rheumatology Following. 03/29/2024 Chronic Pain Syndrome: Refilled: Oxycodone  5/325 mg one tablet twice a day as needed for pain #60.SABRA We will continue the opioid monitoring program, this consists of regular clinic visits, examinations, urine drug screen, pill counts as well as use of [place]  Controlled Substance Reporting system. A 12 month History has been reviewed on the [place]  Controlled Substance Reporting System 03/29/2024 5. Cervicalgia/ Cervical Radiculitis: No complaints today. Continue Gabapentin . Continue to Monitor. 03/29/2024 6. Myofascial Pain Syndrome: Continue Methocarbamol  . Continue HEP as Tolerated. Continue to Monitor. 03/29/2024. 7 Insomnia: Continue Trazodone . Continue to monitor.  03/29/2024   F/U in 2 months

## 2024-03-30 ENCOUNTER — Encounter: Payer: Self-pay | Admitting: Registered Nurse

## 2024-04-22 NOTE — Progress Notes (Signed)
 Subjective:  Brittany Tran presents for anticoagulation therapy follow-up today.  Location of service: Internal Medicine - [place]   Indication: DVT and PE -- Last DVT 03/14/22   Problem List[1]    HPI:  Patient is without complaints and has had no recent changes in medical health.  Other Concerns: no   Acute Illness/ED/Hos Visit Recently?  no  Medication Changes:  no.   Pt denies any new or changes in rx/otc/herbal/vitamin products since last visit. no  Missed/Extra Doses:  due to elevated INR held warfarin 10/28 and 10/29  Dietary Changes: no.   Unusual bruising/Bleeding Signs/Symptoms: no  Upcoming procedure: no   Recent falls: no   Tobacco: no  Alcohol:no  TCH/CBG: no  Review of Systems Constitutional: WNL Skin:  WNL, no rashes or unusual bleeding/bruising ENT:  WNL,no nose or gum bleeding GI:  WNL, no unusual colored stool, no rectal bleeding, no N/V GU:  WNL, no hematuria Respiratory:  WNL, no hemoptysis or SOB Cardiovascular:  WNL, no dizziness, edema or chest pain Musculoskeletal:  WNL, no pain, redness or falls      Objective:  Today's INR:  4.30  --- hold coumadin  dosage today --- Decrease in weekly coumadin  dosing -- over the last 7 days she has taken 25 mg --- AVS provided and explained with coumadin  calender and return appt information ---Denied any recent medication or dietary changes which may impact INR.  -- denied any signs / symptom of bleeding   Current warfarin dose:  Anticoagulation  - decrease warfarin dose to 20 mg/wk from 27.5  mg/week  INR  Date Value Ref Range Status  04/13/2024 4.3 (A) 2.0 - 3.0 Final  03/16/2024 2.9 No Reference Range Final  03/09/2024 2.1 2.0 - 3.0 Final  03/04/2024 8.0 (A) 2.0 - 3.0 Final  01/22/2024 3.0 2.0 - 3.0 Final  12/25/2023 2.1 2.0 - 3.0 Final   International Normalized Ratio (INR)  Date Value Ref Range Status  03/06/2024 6.2 (HH) <5.0 Final  03/04/2024   Final    Comment:    Not  Calculated    Lab Results  Component Value Date   WBC 4.70 12/30/2022   HGB 10.9 (L) 12/30/2022   HCT 33.0 (L) 12/30/2022   MCV 95.6 12/30/2022   PLT 171 12/30/2022    Physical Exam:  General Appearance:  Well appearing, well developed, well nourished, NAD HEENT:  No active bleeding noted. Skin:  Warm and dry without ecchymosis. Neurological: alert and oriented,x 3, ambulates: with cane     Current Medications[2]  Allergies[3]  Assessment:   Supratherapeutic INR and without bleeding complications Target INR goal of  2-3  1. Long term (current) use of anticoagulants      2. Acute venous embolism and thrombosis of deep vessels of proximal lower extremity, unspecified laterality      3. Monitoring for anticoagulant use         Plan:  1. Coumadin  dose until next visit:  Take  1/2 tablet daily except 1  tablet on Wednesday    --- hold coumadin  dosage today and tomorrow --Pt will increase vitamin K in diet by 1 serving today      --Patient with supratherapeutic INR today,  patient will follow instructions given but is aware if any falls or onset of any bleeding or change in neurological status such as numbness weakness, severe headache or vision change to seek emergency medical treatment immediately.  --Anticoagulation dangers reviewed including avoidance of nsaids,significance of black stool and avoiding dangerous  behaviors  2. Estimated LOT: lifetime   3. NextINR: 2 weeks 05/06/24 ---Return follow-up appt made with patent in agreement with the date and time of follow-up- patient verbalized understanding importance of return visit.  4. Provided warfarin patient education and discussed need for close follow-up, signs and symptoms of bleeding and management, and drug/drug/food interactions.Counseled on appropriate Vitamin K intake   . Stressed the importance of consistent vitamin k intake.      Patient was provided the opportunity to ask questions and stated  understanding. Provided written calendar and understanding was voiced.Reminded patient to always call with new medications.    5.  Seek emergent help for s/sx of uncontrolled bleeding or thrombosis.  Thank you for allowing us  to participate in your care  today.  If you have any questions, please feel free to call us  at [phone].  Lamarr Nicholaus Larve, FNP         [1] Patient Active Problem List Diagnosis  . 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HCC)  . Long term (current) use of anticoagulants  . Rheumatoid arthritis involving multiple sites with positive rheumatoid factor (HCC)  . Clotting disorder (HCC)  . History of DVT (deep vein thrombosis)  . Venous stasis dermatitis of both lower extremities  . Acute on chronic combined systolic and diastolic CHF (congestive heart failure) (HCC)  . Mass of kidney with normal renal function  . Chronic diastolic CHF (congestive heart failure) (HCC)  . Chronic venous insufficiency  . Diabetes mellitus without complication (HCC)  . Fibromyalgia  . Hypertension  . Renal lesion  . Varicose veins of bilateral lower extremities with other complications  . Venous stasis ulcer of left calf with fat layer exposed with varicose veins (HCC)  . Renal mass, right  . Chronic narcotic use  . Insomnia  . Acute venous embolism and thrombosis of deep vessels of proximal lower extremity    (CMD)  . Monitoring for anticoagulant use  . Renal mass  . Acquired solitary kidney  . Left nephrolithiasis  . Nephrolithiasis  [2] Current Outpatient Medications  Medication Sig Dispense Refill  . DULoxetine  (CYMBALTA ) 20 mg capsule Take 20 mg by mouth daily.    . folic acid  (FOLVITE ) 1 mg tablet Take 1 mg by mouth daily.    . gabapentin  (NEURONTIN ) 300 mg capsule Take 300 mg by mouth 3 (three) times a day as needed Indications: neuropathic pain.    . methotrexate 2.5 mg tablet Take 10 tablets by mouth once a week.    . oxyCODONE  (ROXICODONE ) 5 mg immediate  release tablet     . oxyCODONE -acetaminophen  (PERCOCET) 5-325 mg per tablet Take 1 tablet by mouth.    . predniSONE  (DELTASONE ) 1 mg tablet Take 2 mg by mouth daily.    . traZODone  (DESYREL ) 50 mg tablet TAKE 1 TABLET(50 MG) BY MOUTH EVERY NIGHT 30 tablet 0  . warfarin (COUMADIN ) 5 mg tablet Indications: blood clot in a deep vein of the extremities. Take 1/2 tablet every day except 1 tablet on Mondays Wednesdays and Fridays     Current Facility-Administered Medications  Medication Dose Route Frequency Provider Last Rate Last Admin  . lidocaine  (XYLOCAINE ) 2 % jelly 5 mL  5 mL topical PRN Fonda Charlie Morita, MD   5 mL at 04/16/24 9178  . methylPREDNISolone  acetate (DEPO-Medrol ) injection 40 mg  40 mg intramuscular Once Cleatrice Toribio Gosling, PA-C      . sodium chlor-hypochlorous acid (VASHE) 0.033 % irrigation solution irsl   topical PRN Fonda  Charlie Morita, MD   Given at 06/13/23 9178  [3] Allergies Allergen Reactions  . Penicillins Hives and Rash  . Vancomycin  Rash    Developed a red rash bilaterally on her arms, legs, neck, chest and abdomen  . Sulfamethoxazole  Hives  . Adhesive Rash  . Cefazolin  Rash  . Flexeril  Rash  . Sulfa  (Sulfonamide Antibiotics) Hives, Rash and Other (See Comments)

## 2024-04-23 NOTE — Progress Notes (Signed)
 [facility]  Referred by: Katrinka Bouchard, FNP  Chief Complaint: Wound  PMH Includes: - Prior DVT - RA   12/16/22 Wound is significantly healthier. No evidence of infection No f/c/nv.   12/23/22 Doing great. Wounds are smaller. No concerns.   12/30/22 Wound is smaller, but periwound with a few areas of irritation from the wrap.  No concerns. Strength improving.   01/06/23 Wound on lateral ankle is stable.  Periwound has improved but some breakdown on anterior leg No f/cn/v.  Starts work next week.   01/13/23 Wounds are improving. Most ulcerations appear nearly healed.  Ankle is still present No f/c/n/v.   02/03/23 Wounds are doing well overall.  She was in the hospital again.    02/18/23 Wound is doing well.  Lateral leg healed.   04/03/23 Since I last saw her, she had cellulitis. She finished antibiotics.  Her leg has significant ulceration on medial leg Denies f/cn/v.   04/10/23 Wound is improved slightly. She is having some pain in the wounds Denies f/c/n/v.   04/17/23 Here for follow up. Still moderate drainage but wound improving Swelling is down  04/25/23 Wound is doing better. Her pain is stable. Denies systemic symptoms.   05/02/23 Wounds are way improved. No concerns.   05/09/23 Wound is significantly healthier. No infection.  Pain is minimal No concerns.   05/23/23 Wound healing up great. Pain is gone No concerns.   05/30/23 Continues to improve  06/06/23 Wound is improving. Nearly healed.  No f/c/n/v.   06/13/23 One remaining area. Slowly improving. No infection.   06/19/22 Area still open. Stable in size. Maybe the edge of the utlrafoam rubbed No f/c/n/v.   06/27/23 Wound is stable. No concerns. Feeling well.   06/1723 Wound is healthier today. Measurements are stable No concerns  07/18/23 Wounds appear healthier. Unable to be seen for 2 weeks Has not used pumps. Needs leggins.   07/25/23 Wound is  smaller. Happy with progress. Pain is less  08/08/23 Unable to follow up for 2 weeks. Wound is larger today. More drainage on the skin.  No systemic symptoms.  No pain.   08/15/23 Wound is healing slowly. Healthier. Having some pain But some progress from last week  08/22/23 Wound is improved. Smaller. Doing much better  08/29/23 Wound is healthier this week. Progress noted Tolerating the wrap. Pain improving.   08/29/23 Wounds are showing progress. There is contracture No acute infectious signs.   09/12/23 Overall wounds are healthier. Smaller. Significantly healthier Pain improved this week Edema down   10/17/23 Wound is improved. She unfortunately has not been seen regularly for several weeks.  Doing great No concerns   10/24/23 Wound is nearly healed. No concerns.  10/31/23 Wound appears healed. No issues No concerns.   11/07/23 Wound is healed. Did well with compression no concerns  11/14/23 Wound remains healed. No concerns.   11-28-23 Wound still healed. No concerns.   02-20-24 Here for evaluation of new wound on left lower leg. The ulceration began about a month ago. The stocking would pull on her skin causing some drainage. She is having some discomfort but no fevers or chills.   Seen in Ed on 02-12-24. Unable to remove her stockings or bathe for awhile. Precautionary doxycyline given.   CMP with albumin 3.0, Cr 3.12 01-19-24 CBC with hgb 10.8   XR Left leg 02-12-24   03-05-24 Wound is progressing. Tolerating wrap.   03-12-24- Wounds are improved on lower  leg. Fewer areas. Less drainage.   03-19-24 Wounds are doing well. No concerns.   03-26-24 Wound is progressing. Healthier. She is eeling well No concerns.   04-02-24 Wounds are doing well. Fewer in number. No infection.   04-15-24 Wounds are contracting. Less drainage. Less pain  04-23-24 Continues to show progress. Feeling well.   Vitals:   04/23/24 0826  BP: 147/87  Pulse:   Resp:   Temp:        Exam: Pt is AA in NAD.  Wound(s) exam: Left leg:  The remaining two areas are both smaller and healthier. No infection Edema is down  Wound 02/20/24 Pretibial Left (Active)  Date First Assessed/Time First Assessed: 02/20/24 0918   Location: Pretibial  Wound Location Orientation: Left    Assessments 02/20/2024  9:18 AM 04/23/2024  8:20 AM  Wound Image      Site Assessment Bleeding;Painful;Red Granulation tissue;Slough  Peri-Wound Assessment Dry Clean;Dry;Intact  Closure Adhesive bandage --  Drainage Amount Moderate Moderate  Drainage Description Serosanguineous Serosanguineous;Yellow  Wound Odor Foul None  Wound Length (cm) 9.5 cm 1 cm  Wound Width (cm) 9 cm 0.6 cm  Wound Surface Area (cm^2) 67.15 cm^2 0.47 cm^2  Wound Depth (cm) 0.3 cm 0.1 cm  Wound Volume (cm^3) 13.43 cm^3 0.031 cm^3  Wound Healing % -- 100  Non-staged Wound Description Full thickness Full thickness     Active Orders  Date Order Priority Status Authorizing Provider  04/23/24 [phone] Wound Care Routine Active Fonda Charlie Morita, MD    - Wound cleansing::    Keep dry    - Dressing change frequency::    Do not change dressing for an entire week    - Primary wound dressing::    Other (Specify) (Gentian Violet, Xeroform)    - Secondary wound dressing::    Other (Specify) (optilock)    - Edema control::    2 Layer compression system - left lower extremity    - Additional Orders/Instructions::    Follow nutritous diet    - Follow Up Appointment::    Return to clinic in 1 week  04/16/24 0830 Wound Care Routine Active Fonda Charlie Morita, MD    - Wound cleansing::    Keep dry    - Dressing change frequency::    Do not change dressing for an entire week    - Primary wound dressing::    Other (Specify) (Gentian Violet, Xeroform)    - Secondary wound dressing::    Other (Specify) (optilock)    - Edema control::    2 Layer compression system - left lower extremity    - Additional Orders/Instructions::    Follow  nutritous diet    - Follow Up Appointment::    Return to clinic in 1 week  03/26/24 0901 Wound Care Routine Active Fonda Charlie Morita, MD    - Wound cleansing::    Keep dry    - Dressing change frequency::    Do not change dressing for an entire week    - Primary wound dressing::    Other (Specify) (Gentian Violet, Xeroform)    - Secondary wound dressing::    Other (Specify) (optilock)    - Edema control::    2 Layer compression system - left lower extremity    - Additional Orders/Instructions::    Follow nutritous diet    - Follow Up Appointment::    Return to clinic in 1 week  03/12/24 0834 Wound Care Routine Active Fonda Charlie Morita, MD    -  Wound cleansing::    Keep dry    - Dressing change frequency::    Do not change dressing for an entire week    - Primary wound dressing::    Other (Specify) (Gentian violet, Xeroform)    - Secondary wound dressing::    Other (Specify) (optilock)    - Edema control::    2 Layer compression system - left lower extremity    - Additional Orders/Instructions::    Follow nutritous diet    - Follow Up Appointment::    Return to clinic in 1 week  02/27/24 0917 Wound Care Routine Active Curtistine Norleen Recardo Mickey., MD    - Wound cleansing::    Keep dry    - Dressing change frequency::    Do not change dressing for an entire week    - Primary wound dressing::    Other (Specify) (Gentian Violet, Xeroform)    - Secondary wound dressing::    Other (Specify) (optilock)    - Edema control::    2 Layer compression system - left lower extremity    - Additional Orders/Instructions::    Follow nutritous diet    - Follow Up Appointment::    Return to clinic in 1 week     Inactive Orders  Date Order Priority Status Authorizing Provider  04/02/24 [phone] Wound Care Routine Discontinued Fonda Charlie Morita, MD    - Wound cleansing::    Keep dry    - Dressing change frequency::    Do not change dressing for an entire week    - Primary wound dressing::    Other  (Specify) (Gentian Violet, Xeroform)    - Secondary wound dressing::    Other (Specify) (optilock)    - Edema control::    2 Layer compression system - left lower extremity    - Additional Orders/Instructions::    Follow nutritous diet    - Follow Up Appointment::    Return to clinic in 1 week  03/19/24 0906 Wound Care Routine Discontinued Fonda Charlie Morita, MD    - Wound cleansing::    Keep dry    - Dressing change frequency::    Do not change dressing for an entire week    - Primary wound dressing::    Other (Specify) (Gentian Violet,Xeroform)    - Secondary wound dressing::    Other (Specify) (optilock)    - Edema control::    2 Layer compression system - left lower extremity    - Additional Orders/Instructions::    Follow nutritous diet    - Follow Up Appointment::    Return to clinic in 1 week  03/05/24 0834 Wound Care Routine Discontinued Fonda Charlie Morita, MD    - Wound cleansing::    Cleanse wound with wound cleanser    - Additional Orders/Instructions::    Follow nutritous diet    - Follow Up Appointment::    Nurse Visit    - Follow Up Appointment::    Return to clinic in 1 week  02/20/24 1006 Wound Care Routine Discontinued Fonda Charlie Morita, MD    - Wound cleansing::    Keep dry    - Dressing change frequency::    Do not change dressing for an entire week    - Primary wound dressing::    Other (Specify) (Gentian violet)    - Secondary wound dressing::    Other (Specify) (opti lock)    - Edema control::    2 Layer compression system - left lower  extremity    - Additional Orders/Instructions::    Follow nutritous diet    - Follow Up Appointment::    Return to clinic in 1 week     Wound 02/20/24 Ankle Anterior;Left (Active)  Date First Assessed/Time First Assessed: 02/20/24 0921   Location: Ankle  Wound Location Orientation: Anterior;Left    Assessments 02/20/2024  9:21 AM 04/23/2024  8:18 AM  Wound Image      Site Assessment Bleeding;Painful Granulation tissue;Slough   Peri-Wound Assessment Dry Clean;Dry;Intact  Closure Adhesive bandage --  Drainage Amount Moderate None  Drainage Description Serosanguineous --  Wound Odor Foul None  Wound Length (cm) 2.5 cm 0.1 cm  Wound Width (cm) 9 cm 0.1 cm  Wound Surface Area (cm^2) 17.67 cm^2 0.01 cm^2  Wound Depth (cm) 0.1 cm 0.1 cm  Wound Volume (cm^3) 1.178 cm^3 0.001 cm^3  Wound Healing % -- 100  Non-staged Wound Description Full thickness Full thickness     Active Orders  Date Order Priority Status Authorizing Provider  04/23/24 [phone] Wound Care Routine Active Fonda Charlie Morita, MD    - Wound cleansing::    Keep dry    - Dressing change frequency::    Do not change dressing for an entire week    - Primary wound dressing::    Other (Specify) (Gentian Violet, Xeroform)    - Secondary wound dressing::    Other (Specify) (optilock)    - Edema control::    2 Layer compression system - left lower extremity    - Additional Orders/Instructions::    Follow nutritous diet    - Follow Up Appointment::    Return to clinic in 1 week  04/16/24 0830 Wound Care Routine Active Fonda Charlie Morita, MD    - Wound cleansing::    Keep dry    - Dressing change frequency::    Do not change dressing for an entire week    - Primary wound dressing::    Other (Specify) (Gentian Violet, Xeroform)    - Secondary wound dressing::    Other (Specify) (optilock)    - Edema control::    2 Layer compression system - left lower extremity    - Additional Orders/Instructions::    Follow nutritous diet    - Follow Up Appointment::    Return to clinic in 1 week  03/26/24 0901 Wound Care Routine Active Fonda Charlie Morita, MD    - Wound cleansing::    Keep dry    - Dressing change frequency::    Do not change dressing for an entire week    - Primary wound dressing::    Other (Specify) (Gentian Violet, Xeroform)    - Secondary wound dressing::    Other (Specify) (optilock)    - Edema control::    2 Layer compression system - left  lower extremity    - Additional Orders/Instructions::    Follow nutritous diet    - Follow Up Appointment::    Return to clinic in 1 week  03/12/24 0834 Wound Care Routine Active Fonda Charlie Morita, MD    - Wound cleansing::    Keep dry    - Dressing change frequency::    Do not change dressing for an entire week    - Primary wound dressing::    Other (Specify) (Gentian violet, Xeroform)    - Secondary wound dressing::    Other (Specify) (optilock)    - Edema control::    2 Layer compression system - left lower extremity    -  Additional Orders/Instructions::    Follow nutritous diet    - Follow Up Appointment::    Return to clinic in 1 week  02/27/24 0917 Wound Care Routine Active Curtistine Norleen Recardo Mickey., MD    - Wound cleansing::    Keep dry    - Dressing change frequency::    Do not change dressing for an entire week    - Primary wound dressing::    Other (Specify) (Gentian Violet, Xeroform)    - Secondary wound dressing::    Other (Specify) (optilock)    - Edema control::    2 Layer compression system - left lower extremity    - Additional Orders/Instructions::    Follow nutritous diet    - Follow Up Appointment::    Return to clinic in 1 week     Inactive Orders  Date Order Priority Status Authorizing Provider  04/02/24 [phone] Wound Care Routine Discontinued Fonda Charlie Morita, MD    - Wound cleansing::    Keep dry    - Dressing change frequency::    Do not change dressing for an entire week    - Primary wound dressing::    Other (Specify) (Gentian Violet, Xeroform)    - Secondary wound dressing::    Other (Specify) (optilock)    - Edema control::    2 Layer compression system - left lower extremity    - Additional Orders/Instructions::    Follow nutritous diet    - Follow Up Appointment::    Return to clinic in 1 week  03/19/24 0906 Wound Care Routine Discontinued Fonda Charlie Morita, MD    - Wound cleansing::    Keep dry    - Dressing change frequency::    Do not change  dressing for an entire week    - Primary wound dressing::    Other (Specify) (Gentian Violet,Xeroform)    - Secondary wound dressing::    Other (Specify) (optilock)    - Edema control::    2 Layer compression system - left lower extremity    - Additional Orders/Instructions::    Follow nutritous diet    - Follow Up Appointment::    Return to clinic in 1 week  03/05/24 0834 Wound Care Routine Discontinued Fonda Charlie Morita, MD    - Wound cleansing::    Cleanse wound with wound cleanser    - Additional Orders/Instructions::    Follow nutritous diet    - Follow Up Appointment::    Nurse Visit    - Follow Up Appointment::    Return to clinic in 1 week  02/20/24 1006 Wound Care Routine Discontinued Fonda Charlie Morita, MD    - Wound cleansing::    Keep dry    - Dressing change frequency::    Do not change dressing for an entire week    - Primary wound dressing::    Other (Specify) (Gentian violet)    - Secondary wound dressing::    Other (Specify) (opti lock)    - Edema control::    2 Layer compression system - left lower extremity    - Additional Orders/Instructions::    Follow nutritous diet    - Follow Up Appointment::    Return to clinic in 1 week     Wound 02/20/24 Ankle Left;Lateral (Active)  Date First Assessed/Time First Assessed: 02/20/24 0923   Location: Ankle  Wound Location Orientation: Left;Lateral    Assessments 02/20/2024  9:23 AM 04/23/2024  8:18 AM  Wound Image  Site Assessment Bleeding;Painful Granulation tissue  Peri-Wound Assessment Dry Clean;Dry;Intact  Closure Adhesive bandage --  Drainage Amount Moderate Small  Drainage Description Serosanguineous Serosanguineous;Yellow  Wound Odor Foul --  Treatments -- Cleansed  Wound Length (cm) 2 cm 0.9 cm  Wound Width (cm) 2 cm 0.4 cm  Wound Surface Area (cm^2) 3.14 cm^2 0.28 cm^2  Wound Depth (cm) 0.2 cm 0.1 cm  Wound Volume (cm^3) 0.419 cm^3 0.019 cm^3  Wound Healing % -- 95  Non-staged Wound Description Full  thickness Full thickness     Active Orders  Date Order Priority Status Authorizing Provider  04/23/24 [phone] Wound Care Routine Active Fonda Charlie Morita, MD    - Wound cleansing::    Keep dry    - Dressing change frequency::    Do not change dressing for an entire week    - Primary wound dressing::    Other (Specify) (Gentian Violet, Xeroform)    - Secondary wound dressing::    Other (Specify) (optilock)    - Edema control::    2 Layer compression system - left lower extremity    - Additional Orders/Instructions::    Follow nutritous diet    - Follow Up Appointment::    Return to clinic in 1 week  04/16/24 0830 Wound Care Routine Active Fonda Charlie Morita, MD    - Wound cleansing::    Keep dry    - Dressing change frequency::    Do not change dressing for an entire week    - Primary wound dressing::    Other (Specify) (Gentian Violet, Xeroform)    - Secondary wound dressing::    Other (Specify) (optilock)    - Edema control::    2 Layer compression system - left lower extremity    - Additional Orders/Instructions::    Follow nutritous diet    - Follow Up Appointment::    Return to clinic in 1 week  03/26/24 0901 Wound Care Routine Active Fonda Charlie Morita, MD    - Wound cleansing::    Keep dry    - Dressing change frequency::    Do not change dressing for an entire week    - Primary wound dressing::    Other (Specify) (Gentian Violet, Xeroform)    - Secondary wound dressing::    Other (Specify) (optilock)    - Edema control::    2 Layer compression system - left lower extremity    - Additional Orders/Instructions::    Follow nutritous diet    - Follow Up Appointment::    Return to clinic in 1 week  03/12/24 0834 Wound Care Routine Active Fonda Charlie Morita, MD    - Wound cleansing::    Keep dry    - Dressing change frequency::    Do not change dressing for an entire week    - Primary wound dressing::    Other (Specify) (Gentian violet, Xeroform)    - Secondary wound  dressing::    Other (Specify) (optilock)    - Edema control::    2 Layer compression system - left lower extremity    - Additional Orders/Instructions::    Follow nutritous diet    - Follow Up Appointment::    Return to clinic in 1 week  02/27/24 0917 Wound Care Routine Active Curtistine Norleen Recardo Mickey., MD    - Wound cleansing::    Keep dry    - Dressing change frequency::    Do not change dressing for an entire week    -  Primary wound dressing::    Other (Specify) (Gentian Violet, Xeroform)    - Secondary wound dressing::    Other (Specify) (optilock)    - Edema control::    2 Layer compression system - left lower extremity    - Additional Orders/Instructions::    Follow nutritous diet    - Follow Up Appointment::    Return to clinic in 1 week     Inactive Orders  Date Order Priority Status Authorizing Provider  04/02/24 [phone] Wound Care Routine Discontinued Fonda Charlie Morita, MD    - Wound cleansing::    Keep dry    - Dressing change frequency::    Do not change dressing for an entire week    - Primary wound dressing::    Other (Specify) (Gentian Violet, Xeroform)    - Secondary wound dressing::    Other (Specify) (optilock)    - Edema control::    2 Layer compression system - left lower extremity    - Additional Orders/Instructions::    Follow nutritous diet    - Follow Up Appointment::    Return to clinic in 1 week  03/19/24 0906 Wound Care Routine Discontinued Fonda Charlie Morita, MD    - Wound cleansing::    Keep dry    - Dressing change frequency::    Do not change dressing for an entire week    - Primary wound dressing::    Other (Specify) (Gentian Violet,Xeroform)    - Secondary wound dressing::    Other (Specify) (optilock)    - Edema control::    2 Layer compression system - left lower extremity    - Additional Orders/Instructions::    Follow nutritous diet    - Follow Up Appointment::    Return to clinic in 1 week  03/05/24 0834 Wound Care Routine Discontinued Fonda Charlie Morita, MD    - Wound cleansing::    Cleanse wound with wound cleanser    - Additional Orders/Instructions::    Follow nutritous diet    - Follow Up Appointment::    Nurse Visit    - Follow Up Appointment::    Return to clinic in 1 week  02/20/24 1006 Wound Care Routine Discontinued Fonda Charlie Morita, MD    - Wound cleansing::    Keep dry    - Dressing change frequency::    Do not change dressing for an entire week    - Primary wound dressing::    Other (Specify) (Gentian violet)    - Secondary wound dressing::    Other (Specify) (opti lock)    - Edema control::    2 Layer compression system - left lower extremity    - Additional Orders/Instructions::    Follow nutritous diet    - Follow Up Appointment::    Return to clinic in 1 week      Assessment Encounter Diagnosis  Name Primary?  . 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 Anterior is nearly healed. Lateral leg is improving.   PLAN: Gentian violet, xeroform, optilock Urgo K2 Elevate legs High protein diet  Start pumps once healed  F/u 1 week sooner if needed   Orders Placed This Encounter  Procedures  . Wound Care    LEFT LEG:  Apply Gentian Violet, Xeroform, Opti lock, URGO K2. Change weekly.    Standing Status:   Standing  Number of Occurrences:   1    Next Expected Occurrence:   04/23/2024    Expiration Date:   04/29/2024    Wound cleansing::   Keep dry    Dressing change frequency::   Do not change dressing for an entire week    Primary wound dressing::   Other (Specify)             Gentian Violet, Xeroform    Secondary wound dressing::   Other (Specify)             optilock    Edema control::   2 Layer compression system - left lower extremity    Additional Orders/Instructions::   Follow nutritous diet    Follow Up Appointment::   Return to clinic in 1 week

## 2024-04-30 NOTE — Progress Notes (Signed)
 Dressing applied per providers orders.  Patient tolerated procedure well.  Patient given AVS.

## 2024-05-07 ENCOUNTER — Emergency Department (HOSPITAL_COMMUNITY)
Admission: EM | Admit: 2024-05-07 | Discharge: 2024-05-07 | Disposition: A | Discharge status: Home / Self Care | Payer: Self-pay | Source: Ambulatory Visit | Attending: Emergency Medicine | Admitting: Emergency Medicine

## 2024-05-07 DIAGNOSIS — Z7901 Long term (current) use of anticoagulants: Secondary | ICD-10-CM | POA: Insufficient documentation

## 2024-05-07 DIAGNOSIS — R791 Abnormal coagulation profile: Secondary | ICD-10-CM | POA: Insufficient documentation

## 2024-05-07 LAB — COMPREHENSIVE METABOLIC PANEL WITH GFR
ALT: 8 U/L (ref 0–44)
AST: 21 U/L (ref 15–41)
Albumin: 3.4 g/dL — ABNORMAL LOW (ref 3.5–5.0)
Alkaline Phosphatase: 139 U/L — ABNORMAL HIGH (ref 38–126)
Anion gap: 8 (ref 5–15)
BUN: 14 mg/dL (ref 8–23)
CO2: 27 mmol/L (ref 22–32)
Calcium: 9.1 mg/dL (ref 8.9–10.3)
Chloride: 111 mmol/L (ref 98–111)
Creatinine, Ser: 1.14 mg/dL — ABNORMAL HIGH (ref 0.44–1.00)
GFR, Estimated: 53 mL/min — ABNORMAL LOW (ref >60)
Glucose, Bld: 67 mg/dL — ABNORMAL LOW (ref 70–99)
Potassium: 4.1 mmol/L (ref 3.5–5.1)
Sodium: 146 mmol/L — ABNORMAL HIGH (ref 135–145)
Total Bilirubin: 0.4 mg/dL (ref 0.0–1.2)
Total Protein: 7.1 g/dL (ref 6.5–8.1)

## 2024-05-07 LAB — CBC WITH DIFFERENTIAL/PLATELET
Abs Immature Granulocytes: 0.06 K/uL (ref 0.00–0.07)
Basophils Absolute: 0 K/uL (ref 0.0–0.1)
Basophils Relative: 1 %
Eosinophils Absolute: 0.2 K/uL (ref 0.0–0.5)
Eosinophils Relative: 4 %
HCT: 35.2 % — ABNORMAL LOW (ref 36.0–46.0)
Hemoglobin: 10.9 g/dL — ABNORMAL LOW (ref 12.0–15.0)
Immature Granulocytes: 1 %
Lymphocytes Relative: 51 %
Lymphs Abs: 2.6 K/uL (ref 0.7–4.0)
MCH: 32.4 pg (ref 26.0–34.0)
MCHC: 31 g/dL (ref 30.0–36.0)
MCV: 104.8 fL — ABNORMAL HIGH (ref 80.0–100.0)
Monocytes Absolute: 0.8 K/uL (ref 0.1–1.0)
Monocytes Relative: 17 %
Neutro Abs: 1.3 K/uL — ABNORMAL LOW (ref 1.7–7.7)
Neutrophils Relative %: 26 %
Platelets: 678 K/uL — ABNORMAL HIGH (ref 150–400)
RBC: 3.36 MIL/uL — ABNORMAL LOW (ref 3.87–5.11)
RDW: 19.2 % — ABNORMAL HIGH (ref 11.5–15.5)
WBC: 5 K/uL (ref 4.0–10.5)
nRBC: 0 % (ref 0.0–0.2)

## 2024-05-07 LAB — PROTIME-INR
INR: 5.8 (ref 0.8–1.2)
Prothrombin Time: 54.8 s — ABNORMAL HIGH (ref 11.4–15.2)

## 2024-05-07 MED ORDER — PHYTONADIONE 5 MG PO TABS
2.5000 mg | ORAL_TABLET | Freq: Once | ORAL | Status: AC
  Administered 2024-05-07: 2.5 mg via ORAL
  Filled 2024-05-07: qty 1

## 2024-05-07 NOTE — Discharge Instructions (Addendum)
 Your INR (Coumadin  level) was improved but still high.  On our measurement here, it was 5.8.   You have stable anemia.  Your platelets are a bit high.  Otherwise no concerning findings with your labs.  Please follow-up with your clinic on Monday as planned and follow their instructions regarding your warfarin dose.  Please return to the emergency department if you notice any bleeding or have any significant injuries.

## 2024-05-07 NOTE — ED Provider Notes (Signed)
 [place] EMERGENCY DEPARTMENT AT [facility] Provider Note   CSN: [id number] Arrival date & time: 05/07/24  8596     Patient presents with: Abnormal Lab   Brittany Tran is a 63 y.o. female.   Patient with history of DVT on warfarin, rheumatoid arthritis -- presents to the emergency department for INR greater than 10.  Patient states that her INRs have been high since about September.  She has been working with her clinic to adjust medications.  She states that most recently they have been in the 4 range.  She states that she has been taking Tylenol  with pain medication due to dental pain recently.  INR was nines yesterday, recheck today was 10.2.  Patient denies any bleeding.  No black or bloody stools.  No hematuria.  No vaginal bleeding.  She denies worsening bruising or bleeding.       Prior to Admission medications   Medication Sig Start Date End Date Taking? Authorizing Provider  acetaminophen  (TYLENOL ) 325 MG tablet Take 2 tablets (650 mg total) by mouth every 6 (six) hours as needed for mild pain or fever. 05/21/18   Pearlean Manus, MD  citalopram  (CELEXA ) 10 MG tablet TAKE 1 TABLET(10 MG) BY MOUTH DAILY 01/21/24   Debby Fidela CROME, NP  cyclobenzaprine  (FLEXERIL ) 10 MG tablet Take 1 tablet (10 mg total) by mouth 2 (two) times daily as needed for muscle spasms. 02/02/24   Roselyn Carlin NOVAK, MD  Docusate Sodium  (DSS) 100 MG CAPS Take by mouth. 03/07/22   [provider]  folic acid  (FOLVITE ) 1 MG tablet Take by mouth.    [provider]  furosemide  (LASIX ) 20 MG tablet Take 1 tablet (20 mg total) by mouth daily. 02/14/22 01/23/24  Pokhrel, Laxman, MD  gabapentin  (NEURONTIN ) 300 MG capsule Take 1 capsule (300 mg total) by mouth 3 (three) times daily. 07/17/23   Debby Fidela CROME, NP  glucose blood (TRUE METRIX BLOOD GLUCOSE TEST) test strip Use as instructed 06/22/18   Brien Belvie BRAVO, MD  hydrocerin (EUCERIN) CREA Apply 1 Application topically 2 (two)  times daily. To dry skin and areas of scaling 02/12/22   Pokhrel, Vernal, MD  methotrexate (RHEUMATREX) 2.5 MG tablet Take by mouth. 07/05/19   [provider]  Multiple Vitamin (MULTIVITAMIN WITH MINERALS) TABS tablet Take 1 tablet by mouth daily. 02/13/22   Pokhrel, Laxman, MD  oxyCODONE -acetaminophen  (PERCOCET) 5-325 MG tablet Take 1 tablet by mouth 2 (two) times daily as needed for moderate pain (pain score 4-6). 03/29/24   Debby Fidela CROME, NP  polyethylene glycol (MIRALAX  / GLYCOLAX ) 17 g packet Take 17 g by mouth daily as needed for moderate constipation or mild constipation. 02/12/22   Pokhrel, Vernal, MD  predniSONE  (DELTASONE ) 1 MG tablet Take by mouth. 05/20/21   [provider]  traZODone  (DESYREL ) 50 MG tablet Take 1 tablet (50 mg total) by mouth at bedtime as needed for sleep. 03/29/24   Debby Fidela CROME, NP  TRUEPLUS LANCETS 28G MISC Use to check blood sugar daily. 06/22/18   Brien Belvie BRAVO, MD  Vitamin D , Ergocalciferol , (DRISDOL ) 1.25 MG (50000 UNIT) CAPS capsule Take 1 capsule (50,000 Units total) by mouth every 7 (seven) days. 12/26/21   Raulkar, Krutika P, MD  warfarin (COUMADIN ) 2 MG tablet SMARTSIG:1 Tablet(s) By Mouth Every Evening    [provider]  warfarin (COUMADIN ) 5 MG tablet Take by mouth. 07/03/22 01/23/24  [provider]    Allergies: Penicillins, Vancomycin , Cefazolin , Penicillin  g benzathine, and Sulfa  antibiotics    Review of Systems  Updated Vital Signs BP 127/75   Pulse (!) 107   Temp 98.2 F (36.8 C) (Oral)   Resp 20   SpO2 96%   Physical Exam Vitals and nursing note reviewed.  Constitutional:      Appearance: She is well-developed.  HENT:     Head: Normocephalic and atraumatic.  Eyes:     Conjunctiva/sclera: Conjunctivae normal.  Cardiovascular:     Rate and Rhythm: Normal rate and regular rhythm.  Pulmonary:     Effort: No respiratory distress.  Abdominal:     Tenderness: There is no abdominal tenderness.  There is no guarding.     Hernia: No hernia is present.  Musculoskeletal:     Cervical back: Normal range of motion and neck supple.  Skin:    General: Skin is warm and dry.     Findings: No bruising.  Neurological:     Mental Status: She is alert.     (all labs ordered are listed, but only abnormal results are displayed) Labs Reviewed  CBC WITH DIFFERENTIAL/PLATELET - Abnormal; Notable for the following components:      Result Value   RBC 3.36 (*)    Hemoglobin 10.9 (*)    HCT 35.2 (*)    MCV 104.8 (*)    RDW 19.2 (*)    Platelets 678 (*)    Neutro Abs 1.3 (*)    All other components within normal limits  COMPREHENSIVE METABOLIC PANEL WITH GFR - Abnormal; Notable for the following components:   Sodium 146 (*)    Glucose, Bld 67 (*)    Creatinine, Ser 1.14 (*)    Albumin 3.4 (*)    Alkaline Phosphatase 139 (*)    GFR, Estimated 53 (*)    All other components within normal limits  PROTIME-INR - Abnormal; Notable for the following components:   Prothrombin Time 54.8 (*)    INR 5.8 (*)    All other components within normal limits    EKG: None  Radiology: No results found.   Procedures   Medications Ordered in the ED - No data to display  ED Course  Patient seen and examined. History obtained directly from patient.   Labs/EKG: Awaiting confirmation of elevated INR.    Imaging: None ordered  Medications/Fluids:   Most recent vital signs reviewed and are as follows: BP 127/75   Pulse (!) 107   Temp 98.2 F (36.8 C) (Oral)   Resp 20   SpO2 96%   Initial impression: elevated INR, will plan to order vitamin K.  Patient discussed with and seen by Dr. Charlyn.   5:44 PM Reassessment performed. Patient appears stable, comfortable.  Labs personally reviewed and interpreted including: CBC with blood cell count 5.0, hemoglobin stable at 10.9, macrocytic, platelets elevated at 678; CMP sodium 146, creatinine 1.14, normal liver function test, glucose 67 with  patient without signs of hypoglycemia; INR was 5.8.  Reviewed pertinent lab work and imaging with patient at bedside. Questions answered.   Most current vital signs reviewed and are as follows: BP 125/64   Pulse 77   Temp 98.2 F (36.8 C) (Oral)   Resp 17   SpO2 100%   Plan: Discharge to home.   Prescriptions written for: None  Other home care instructions discussed: We discussed monitoring for bleeding and need to return if any of these symptoms occur.  We also discussed if she has any significant falls or  accidents, hits her head, she also needs to take this seriously and return to the emergency department.  ED return instructions discussed: As discussed above.  Follow-up instructions discussed: Patient encouraged to follow-up with their warfarin clinic as planned in 3 days.                                   Medical Decision Making Amount and/or Complexity of Data Reviewed Labs: ordered.  Risk Prescription drug management.   Patient presents with supratherapeutic INR, no bleeding symptoms.  INR here is improved to 5.8 after being measured greater than 10 at her warfarin clinic.  Patient was given 2.5 mg of vitamin K.  She has appropriate follow-up.  She seems reliable to return with any bleeding symptoms or injuries.  The patient's vital signs, pertinent lab work and imaging were reviewed and interpreted as discussed in the ED course. Hospitalization was considered for further testing, treatments, or serial exams/observation. However as patient is well-appearing, has a stable exam, and reassuring studies today, I do not feel that they warrant admission at this time. This plan was discussed with the patient who verbalizes agreement and comfort with this plan and seems reliable and able to return to the Emergency Department with worsening or changing symptoms.       Final diagnoses:  Supratherapeutic INR    ED Discharge Orders     None          Desiderio Chew,  PA-C 05/07/24 1748    Nanavati, Ankit, MD 05/08/24 1441

## 2024-05-07 NOTE — ED Triage Notes (Signed)
 Sent here by hematology for INR 10.2. Sent here for eval and possible infusion. Only symptom per patient is feeling tired.

## 2024-06-01 ENCOUNTER — Encounter: Payer: Self-pay | Attending: Registered Nurse | Admitting: Registered Nurse

## 2024-06-01 NOTE — Progress Notes (Deleted)
 Subjective:    Patient ID: Brittany Tran, female    DOB: April 03, 1961, 63 y.o.   MRN: [id number]  HPI   Pain Inventory Average Pain {NUMBERS; 0-10:5044} Pain Right Now {NUMBERS; 0-10:5044} My pain is {PAIN DESCRIPTION:21022940}  In the last 24 hours, has pain interfered with the following? General activity {NUMBERS; 0-10:5044} Relation with others {NUMBERS; 0-10:5044} Enjoyment of life {NUMBERS; 0-10:5044} What TIME of day is your pain at its worst? {time of day:24191} Sleep (in general) {BHH GOOD/FAIR/POOR:22877}  Pain is worse with: {ACTIVITIES:21022942} Pain improves with: {PAIN IMPROVES TPUY:78977056} Relief from Meds: {NUMBERS; 0-10:5044}  Family History  Problem Relation Age of Onset   Diabetes Mother    Social History   Socioeconomic History   Marital status: Single    Spouse name: Not on file   Number of children: Not on file   Years of education: Not on file   Highest education level: Not on file  Occupational History   Occupation: unemployed  Tobacco Use   Smoking status: Never   Smokeless tobacco: Never  Vaping Use   Vaping status: Never Used  Substance and Sexual Activity   Alcohol use: No   Drug use: No   Sexual activity: Not on file  Other Topics Concern   Not on file  Social History Narrative   Not on file   Social Drivers of Health   Tobacco Use: Low Risk (05/07/2024)   Received from [facility]   Patient History    Passive Exposure: Not on file    Smoking Tobacco Use: Never    Smokeless Tobacco Use: Never  Financial Resource Strain: Not on file  Food Insecurity: Low Risk (01/31/2023)   Received from [facility]   Epic    Within the past 12 months, you worried that your food would run out before you got money to buy more: Never true    Within the past 12 months, the food you bought just didn't last and you didn't have money to get more. : Never true  Transportation Needs: No Transportation Needs (01/31/2023)   Received from  [facility]    In the past 12 months, has lack of reliable transportation kept you from medical appointments, meetings, work or from getting things needed for daily living? : No  Physical Activity: Not on file  Stress: Not on file  Social Connections: Not on file  Depression (PHQ2-9): Low Risk (09/24/2023)   Depression (PHQ2-9)    PHQ-2 Score: 2  Alcohol Screen: Not on file  Housing: Low Risk (01/31/2023)   Received from [facility]   Epic    What is your living situation today?: I have a steady place to live    Think about the place you live. Do you have problems with any of the following? Choose all that apply:: None/None on this list  Utilities: Low Risk (01/31/2023)   Received from [facility]   Utilities    In the past 12 months has the electric, gas, oil, or water company threatened to shut off services in your home? : No  Health Literacy: Not on file   Past Surgical History:  Procedure Laterality Date   HARDWARE REMOVAL Left 09/29/2014   Procedure: REMOVAL OF DEEP IMPLANT OF LEFT FIBULA  AND TIBIA (SEPARATE INCISIONS);  Surgeon: Norleen Armor, MD;  Location: [facility];  Service: Orthopedics;  Laterality: Left;   ORIF ANKLE FRACTURE  1988   left   Past Surgical History:  Procedure Laterality Date   HARDWARE REMOVAL Left 09/29/2014   Procedure: REMOVAL OF DEEP IMPLANT OF LEFT FIBULA  AND TIBIA (SEPARATE INCISIONS);  Surgeon: Norleen Armor, MD;  Location: [facility];  Service: Orthopedics;  Laterality: Left;   ORIF ANKLE FRACTURE  1988   left   Past Medical History:  Diagnosis Date   Cellulitis    Diabetes mellitus without complication (HCC)    Diastolic heart failure (HCC)    Echo 03/14/15 Grade I DD   Fibromyalgia    History of DVT (deep vein thrombosis)    Hypertension    Rheumatoid arthritis (HCC)    Sepsis (HCC) 12/2019   There were no vitals taken for this visit.  Opioid Risk Score:   Fall Risk Score:   `1  Depression screen [facility] 2/9     09/24/2023    9:21 AM 07/17/2023   10:24 AM 03/21/2023    8:52 AM 02/19/2023   10:16 AM 01/24/2023   10:05 AM 11/01/2022    8:47 AM 10/03/2022   10:10 AM  Depression screen PHQ 2/9  Decreased Interest 1 1 0 0 0 0 1  Down, Depressed, Hopeless 1 1 0 0 0 0 1  PHQ - 2 Score 2 2 0 0 0 0 2    Review of Systems     Objective:   Physical Exam        Assessment & Plan:

## 2024-06-03 ENCOUNTER — Encounter: Payer: Self-pay | Attending: Registered Nurse | Admitting: Registered Nurse

## 2024-06-03 ENCOUNTER — Encounter: Payer: Self-pay | Admitting: Registered Nurse

## 2024-06-03 VITALS — BP 115/82 | HR 89 | Ht 66.0 in | Wt 272.8 lb

## 2024-06-03 DIAGNOSIS — G894 Chronic pain syndrome: Secondary | ICD-10-CM

## 2024-06-03 DIAGNOSIS — G8929 Other chronic pain: Secondary | ICD-10-CM | POA: Insufficient documentation

## 2024-06-03 DIAGNOSIS — M797 Fibromyalgia: Secondary | ICD-10-CM

## 2024-06-03 DIAGNOSIS — M25562 Pain in left knee: Secondary | ICD-10-CM

## 2024-06-03 DIAGNOSIS — Z79899 Other long term (current) drug therapy: Secondary | ICD-10-CM

## 2024-06-03 DIAGNOSIS — Z5181 Encounter for therapeutic drug level monitoring: Secondary | ICD-10-CM

## 2024-06-03 DIAGNOSIS — M25511 Pain in right shoulder: Secondary | ICD-10-CM

## 2024-06-03 DIAGNOSIS — M545 Low back pain, unspecified: Secondary | ICD-10-CM

## 2024-06-03 DIAGNOSIS — M7918 Myalgia, other site: Secondary | ICD-10-CM | POA: Insufficient documentation

## 2024-06-03 DIAGNOSIS — M25561 Pain in right knee: Secondary | ICD-10-CM

## 2024-06-03 DIAGNOSIS — M25512 Pain in left shoulder: Secondary | ICD-10-CM | POA: Insufficient documentation

## 2024-06-03 DIAGNOSIS — Z79891 Long term (current) use of opiate analgesic: Secondary | ICD-10-CM

## 2024-06-03 DIAGNOSIS — M2550 Pain in unspecified joint: Secondary | ICD-10-CM

## 2024-06-03 MED ORDER — OXYCODONE-ACETAMINOPHEN 5-325 MG PO TABS: 1.0000 | ORAL_TABLET | Freq: Two times a day (BID) | ORAL | 0 refills | Status: DC | PRN

## 2024-06-03 NOTE — Progress Notes (Unsigned)
 Subjective:    Patient ID: Brittany Tran, female    DOB: 09-06-1960, 63 y.o.   MRN: [id number]  HPI: Brittany Tran is a 63 y.o. female who returns for follow up appointment for chronic pain and medication refill. states *** pain is located in  ***. rates pain ***. current exercise regime is walking and performing stretching exercises.  [name] Morphine equivalent is  MME.      Pain Inventory Average Pain 8 Pain Right Now 8 My pain is sharp  In the last 24 hours, has pain interfered with the following? General activity 5 Relation with others 5 Enjoyment of life 5 What TIME of day is your pain at its worst? morning  and night Sleep (in general) Fair  Pain is worse with: sitting, standing Pain improves with: medication Relief from Meds: 6  Family History  Problem Relation Age of Onset   Diabetes Mother    Social History   Socioeconomic History   Marital status: Single    Spouse name: Not on file   Number of children: Not on file   Years of education: Not on file   Highest education level: Not on file  Occupational History   Occupation: unemployed  Tobacco Use   Smoking status: Never   Smokeless tobacco: Never  Vaping Use   Vaping status: Never Used  Substance and Sexual Activity   Alcohol use: No   Drug use: No   Sexual activity: Not on file  Other Topics Concern   Not on file  Social History Narrative   Not on file   Social Drivers of Health   Tobacco Use: Low Risk (05/07/2024)   Received from [facility]   Patient History    Passive Exposure: Not on file    Smoking Tobacco Use: Never    Smokeless Tobacco Use: Never  Financial Resource Strain: Not on file  Food Insecurity: Low Risk (01/31/2023)   Received from [facility]   Epic    Within the past 12 months, you worried that your food would run out before you got money to buy more: Never true    Within the past 12 months, the food you bought just didn't last and you didn't have money to  get more. : Never true  Transportation Needs: No Transportation Needs (01/31/2023)   Received from [facility]    In the past 12 months, has lack of reliable transportation kept you from medical appointments, meetings, work or from getting things needed for daily living? : No  Physical Activity: Not on file  Stress: Not on file  Social Connections: Not on file  Depression (PHQ2-9): Low Risk (09/24/2023)   Depression (PHQ2-9)    PHQ-2 Score: 2  Alcohol Screen: Not on file  Housing: Low Risk (01/31/2023)   Received from [facility]   Epic    What is your living situation today?: I have a steady place to live    Think about the place you live. Do you have problems with any of the following? Choose all that apply:: None/None on this list  Utilities: Low Risk (01/31/2023)   Received from [facility]   Utilities    In the past 12 months has the electric, gas, oil, or water company threatened to shut off services in your home? : No  Health Literacy: Not on file   Past Surgical History:  Procedure Laterality Date   HARDWARE REMOVAL Left 09/29/2014   Procedure: REMOVAL OF  DEEP IMPLANT OF LEFT FIBULA  AND TIBIA (SEPARATE INCISIONS);  Surgeon: Norleen Armor, MD;  Location: [facility];  Service: Orthopedics;  Laterality: Left;   ORIF ANKLE FRACTURE  1988   left   Past Surgical History:  Procedure Laterality Date   HARDWARE REMOVAL Left 09/29/2014   Procedure: REMOVAL OF DEEP IMPLANT OF LEFT FIBULA  AND TIBIA (SEPARATE INCISIONS);  Surgeon: Norleen Armor, MD;  Location: [facility];  Service: Orthopedics;  Laterality: Left;   ORIF ANKLE FRACTURE  1988   left   Past Medical History:  Diagnosis Date   Cellulitis    Diabetes mellitus without complication (HCC)    Diastolic heart failure (HCC)    Echo 03/14/15 Grade I DD   Fibromyalgia    History of DVT (deep vein thrombosis)    Hypertension    Rheumatoid arthritis (HCC)    Sepsis (HCC) 12/2019    There were no vitals taken for this visit.  Opioid Risk Score:   Fall Risk Score:  `1  Depression screen [facility] 2/9     09/24/2023    9:21 AM 07/17/2023   10:24 AM 03/21/2023    8:52 AM 02/19/2023   10:16 AM 01/24/2023   10:05 AM 11/01/2022    8:47 AM 10/03/2022   10:10 AM  Depression screen PHQ 2/9  Decreased Interest 1 1 0 0 0 0 1  Down, Depressed, Hopeless 1 1 0 0 0 0 1  PHQ - 2 Score 2 2 0 0 0 0 2      Review of Systems  Musculoskeletal:  Positive for myalgias.       Fibromylagia       Objective:   Physical Exam        Assessment & Plan:  Chronic Bilateral Thoracic Back Pain/ Chronic Low Back Pain without Sciatica: Continue HEP as tolerated. Continue Gabapentin . Continue to Monitor. 03/29/2024 Chronic Bilateral Shoulder Pain: Continue HEP as Tolerated. Continue to Monitor.  Fibromyalgia: Continue HEP as tolerated. Continue Gabapentin . Continue to Monitor. 03/29/2024 Polyarthralgia: Continue HEP as Tolerated. Continue to Monitor.  Rheumatoid Arthritis: Rheumatology Following. 03/29/2024 Chronic Pain Syndrome: Refilled: Oxycodone  5/325 mg one tablet twice a day as needed for pain #60.SABRA We will continue the opioid monitoring program, this consists of regular clinic visits, examinations, urine drug screen, pill counts as well as use of [place]  Controlled Substance Reporting system. A 12 month History has been reviewed on the [place]  Controlled Substance Reporting System 03/29/2024 5. Cervicalgia/ Cervical Radiculitis: No complaints today. Continue Gabapentin . Continue to Monitor. 03/29/2024 6. Myofascial Pain Syndrome: Continue Methocarbamol  . Continue HEP as Tolerated. Continue to Monitor. 03/29/2024. 7 Insomnia: Continue Trazodone . Continue to monitor. 03/29/2024   F/U in 2 months

## 2024-06-07 LAB — DRUG TOX MONITOR 1 W/CONF, ORAL FLD
Amphetamines: NEGATIVE ng/mL
Barbiturates: NEGATIVE ng/mL
Benzodiazepines: NEGATIVE ng/mL
Buprenorphine: NEGATIVE ng/mL
Cocaine: NEGATIVE ng/mL
Fentanyl: NEGATIVE ng/mL
Heroin Metabolite: NEGATIVE ng/mL
MARIJUANA: NEGATIVE ng/mL
MDMA: NEGATIVE ng/mL
Meprobamate: NEGATIVE ng/mL
Methadone: NEGATIVE ng/mL
Nicotine Metabolite: NEGATIVE ng/mL
Opiates: NEGATIVE ng/mL
Phencyclidine: NEGATIVE ng/mL
Tapentadol: NEGATIVE ng/mL
Tramadol: NEGATIVE ng/mL
Zolpidem: NEGATIVE ng/mL

## 2024-06-07 LAB — DRUG TOX ALC METAB W/CON, ORAL FLD: Alcohol Metabolite: NEGATIVE ng/mL

## 2024-07-23 ENCOUNTER — Other Ambulatory Visit: Payer: Self-pay

## 2024-07-23 DIAGNOSIS — G8929 Other chronic pain: Secondary | ICD-10-CM

## 2024-07-23 DIAGNOSIS — G894 Chronic pain syndrome: Secondary | ICD-10-CM

## 2024-07-23 DIAGNOSIS — M792 Neuralgia and neuritis, unspecified: Secondary | ICD-10-CM

## 2024-07-23 DIAGNOSIS — M797 Fibromyalgia: Secondary | ICD-10-CM

## 2024-07-23 MED ORDER — OXYCODONE-ACETAMINOPHEN 5-325 MG PO TABS: 1.0000 | ORAL_TABLET | Freq: Two times a day (BID) | ORAL | 0 refills | Status: AC | PRN

## 2024-07-23 MED ORDER — GABAPENTIN 300 MG PO CAPS: 300.0000 mg | ORAL_CAPSULE | Freq: Three times a day (TID) | ORAL | 3 refills | Status: AC

## 2024-07-23 NOTE — Telephone Encounter (Signed)
 I spoke with Berwyn Sharps. Please send both prescription to the attached [facility].

## 2024-07-23 NOTE — Telephone Encounter (Signed)
 PDMP was Reviewed.  Call placed to Ms. Halfhill,  Oxycodone  and Gabapentin  e- scribed to pharmacy.  She verbalizes understanding.

## 2024-07-23 NOTE — Telephone Encounter (Signed)
 Call back phone : [phone] ( no voice ID, mail box full)  Patient called for a refill of Oxycodone  5-325 MG & Gabapentin .   PMP report:  Filled  Written  ID  Drug  QTY  Days  Prescriber  RX #  Dispenser  Refill  Daily Dose*  Pymt Type  PMP  06/03/2024 06/03/2024 1  Oxycodone -Acetaminophen  5-325 60.00 30 Eu Tho [id number] [facility] 0/0 15.00 MME Private Pay [place] 03/29/2024 03/29/2024 1  Oxycodone -Acetaminophen  5-325 60.00 30 Eu Tho [id number] [facility] ([facility]) 0/0 15.00 MME Private Pay [place] 02/20/2024 02/20/2024 1  Oxycodone -Acetaminophen  5-325 60.00 30 Eu Tho [id number] [facility] (2191) 0/0 15.00 MME Comm Ins [place]

## 2024-07-29 ENCOUNTER — Encounter: Payer: Self-pay | Admitting: Registered Nurse
